# Patient Record
Sex: Female | Born: 1940
Health system: Southern US, Community
[De-identification: ages and names within clinical notes are randomized; demographics above are authoritative.]

## PROBLEM LIST (undated history)

## (undated) DIAGNOSIS — K219 Gastro-esophageal reflux disease without esophagitis: Secondary | ICD-10-CM

## (undated) DIAGNOSIS — E2839 Other primary ovarian failure: Secondary | ICD-10-CM

## (undated) DIAGNOSIS — K2950 Unspecified chronic gastritis without bleeding: Secondary | ICD-10-CM

## (undated) DIAGNOSIS — J309 Allergic rhinitis, unspecified: Secondary | ICD-10-CM

## (undated) DIAGNOSIS — M549 Dorsalgia, unspecified: Secondary | ICD-10-CM

## (undated) DIAGNOSIS — E7800 Pure hypercholesterolemia, unspecified: Secondary | ICD-10-CM

## (undated) DIAGNOSIS — B029 Zoster without complications: Secondary | ICD-10-CM

## (undated) DIAGNOSIS — K449 Diaphragmatic hernia without obstruction or gangrene: Secondary | ICD-10-CM

## (undated) DIAGNOSIS — F32A Depression, unspecified: Secondary | ICD-10-CM

## (undated) DIAGNOSIS — F329 Major depressive disorder, single episode, unspecified: Secondary | ICD-10-CM

## (undated) DIAGNOSIS — M8580 Other specified disorders of bone density and structure, unspecified site: Secondary | ICD-10-CM

## (undated) DIAGNOSIS — I10 Essential (primary) hypertension: Secondary | ICD-10-CM

## (undated) DIAGNOSIS — H269 Unspecified cataract: Secondary | ICD-10-CM

## (undated) DIAGNOSIS — F410 Panic disorder [episodic paroxysmal anxiety] without agoraphobia: Secondary | ICD-10-CM

## (undated) DIAGNOSIS — M1990 Unspecified osteoarthritis, unspecified site: Secondary | ICD-10-CM

## (undated) DIAGNOSIS — D689 Coagulation defect, unspecified: Secondary | ICD-10-CM

## (undated) DIAGNOSIS — F419 Anxiety disorder, unspecified: Secondary | ICD-10-CM

## (undated) DIAGNOSIS — Z8669 Personal history of other diseases of the nervous system and sense organs: Secondary | ICD-10-CM

## (undated) DIAGNOSIS — K760 Fatty (change of) liver, not elsewhere classified: Secondary | ICD-10-CM

## (undated) DIAGNOSIS — M542 Cervicalgia: Secondary | ICD-10-CM

## (undated) DIAGNOSIS — K589 Irritable bowel syndrome without diarrhea: Secondary | ICD-10-CM

## (undated) DIAGNOSIS — Z7901 Long term (current) use of anticoagulants: Secondary | ICD-10-CM

## (undated) DIAGNOSIS — F439 Reaction to severe stress, unspecified: Secondary | ICD-10-CM

## (undated) DIAGNOSIS — I82409 Acute embolism and thrombosis of unspecified deep veins of unspecified lower extremity: Secondary | ICD-10-CM

## (undated) DIAGNOSIS — E559 Vitamin D deficiency, unspecified: Secondary | ICD-10-CM

## (undated) DIAGNOSIS — D069 Carcinoma in situ of cervix, unspecified: Secondary | ICD-10-CM

## (undated) DIAGNOSIS — K644 Residual hemorrhoidal skin tags: Secondary | ICD-10-CM

## (undated) HISTORY — PX: TUBAL LIGATION: SHX77

## (undated) HISTORY — DX: Dorsalgia, unspecified: M54.9

## (undated) HISTORY — PX: TMJ ARTHROPLASTY: SHX1066

## (undated) HISTORY — DX: Unspecified cataract: H26.9

## (undated) HISTORY — DX: Other specified disorders of bone density and structure, unspecified site: M85.80

## (undated) HISTORY — DX: Other primary ovarian failure: E28.39

## (undated) HISTORY — DX: Essential (primary) hypertension: I10

## (undated) HISTORY — DX: Cervicalgia: M54.2

## (undated) HISTORY — PX: CATARACT EXTRACTION, BILATERAL: SHX1313

## (undated) HISTORY — DX: Panic disorder (episodic paroxysmal anxiety): F41.0

## (undated) HISTORY — PX: KNEE ARTHROSCOPY: SUR90

## (undated) HISTORY — DX: Depression, unspecified: F32.A

## (undated) HISTORY — DX: Anxiety disorder, unspecified: F41.9

## (undated) HISTORY — DX: Long term (current) use of anticoagulants: Z79.01

## (undated) HISTORY — DX: Coagulation defect, unspecified: D68.9

## (undated) HISTORY — DX: Diaphragmatic hernia without obstruction or gangrene: K44.9

## (undated) HISTORY — DX: Fatty (change of) liver, not elsewhere classified: K76.0

## (undated) HISTORY — DX: Unspecified osteoarthritis, unspecified site: M19.90

## (undated) HISTORY — DX: Vitamin D deficiency, unspecified: E55.9

## (undated) HISTORY — DX: Carcinoma in situ of cervix, unspecified: D06.9

## (undated) HISTORY — DX: Residual hemorrhoidal skin tags: K64.4

## (undated) HISTORY — DX: Pure hypercholesterolemia, unspecified: E78.00

## (undated) HISTORY — DX: Reaction to severe stress, unspecified: F43.9

## (undated) HISTORY — DX: Allergic rhinitis, unspecified: J30.9

## (undated) HISTORY — DX: Acute embolism and thrombosis of unspecified deep veins of unspecified lower extremity: I82.409

## (undated) HISTORY — DX: Irritable bowel syndrome, unspecified: K58.9

## (undated) HISTORY — DX: Unspecified chronic gastritis without bleeding: K29.50

## (undated) HISTORY — DX: Gastro-esophageal reflux disease without esophagitis: K21.9

## (undated) HISTORY — DX: Major depressive disorder, single episode, unspecified: F32.9

## (undated) HISTORY — DX: Personal history of other diseases of the nervous system and sense organs: Z86.69

---

## 1977-06-14 HISTORY — PX: VAGINAL HYSTERECTOMY: SUR661

## 1999-08-27 ENCOUNTER — Other Ambulatory Visit: Admission: RE | Admit: 1999-08-27 | Discharge: 1999-08-27 | Payer: Self-pay | Admitting: Obstetrics and Gynecology

## 2000-04-06 ENCOUNTER — Ambulatory Visit (HOSPITAL_BASED_OUTPATIENT_CLINIC_OR_DEPARTMENT_OTHER): Admission: RE | Admit: 2000-04-06 | Discharge: 2000-04-06 | Payer: Self-pay | Admitting: *Deleted

## 2000-04-06 ENCOUNTER — Encounter (INDEPENDENT_AMBULATORY_CARE_PROVIDER_SITE_OTHER): Payer: Self-pay | Admitting: *Deleted

## 2000-10-06 ENCOUNTER — Other Ambulatory Visit: Admission: RE | Admit: 2000-10-06 | Discharge: 2000-10-06 | Payer: Self-pay | Admitting: Obstetrics and Gynecology

## 2003-05-08 ENCOUNTER — Other Ambulatory Visit: Admission: RE | Admit: 2003-05-08 | Discharge: 2003-05-08 | Payer: Self-pay | Admitting: Obstetrics and Gynecology

## 2003-05-21 ENCOUNTER — Ambulatory Visit (HOSPITAL_COMMUNITY): Admission: RE | Admit: 2003-05-21 | Discharge: 2003-05-21 | Payer: Self-pay | Admitting: Family Medicine

## 2003-10-29 ENCOUNTER — Encounter (INDEPENDENT_AMBULATORY_CARE_PROVIDER_SITE_OTHER): Payer: Self-pay | Admitting: Gastroenterology

## 2003-10-29 ENCOUNTER — Ambulatory Visit (HOSPITAL_COMMUNITY): Admission: RE | Admit: 2003-10-29 | Discharge: 2003-10-29 | Payer: Self-pay | Admitting: Gastroenterology

## 2004-03-23 ENCOUNTER — Ambulatory Visit (HOSPITAL_COMMUNITY): Admission: RE | Admit: 2004-03-23 | Discharge: 2004-03-23 | Payer: Self-pay | Admitting: Family Medicine

## 2004-05-21 ENCOUNTER — Other Ambulatory Visit: Admission: RE | Admit: 2004-05-21 | Discharge: 2004-05-21 | Payer: Self-pay | Admitting: Obstetrics and Gynecology

## 2004-06-24 ENCOUNTER — Ambulatory Visit: Payer: Self-pay | Admitting: Gastroenterology

## 2004-07-03 ENCOUNTER — Encounter (INDEPENDENT_AMBULATORY_CARE_PROVIDER_SITE_OTHER): Payer: Self-pay | Admitting: Gastroenterology

## 2004-07-03 ENCOUNTER — Ambulatory Visit (HOSPITAL_COMMUNITY): Admission: RE | Admit: 2004-07-03 | Discharge: 2004-07-03 | Payer: Self-pay | Admitting: Gastroenterology

## 2004-07-13 ENCOUNTER — Ambulatory Visit: Payer: Self-pay | Admitting: Gastroenterology

## 2004-07-21 ENCOUNTER — Ambulatory Visit: Payer: Self-pay | Admitting: Gastroenterology

## 2004-09-17 ENCOUNTER — Ambulatory Visit: Payer: Self-pay | Admitting: Cardiology

## 2004-09-21 ENCOUNTER — Ambulatory Visit: Payer: Self-pay

## 2005-06-09 ENCOUNTER — Other Ambulatory Visit: Admission: RE | Admit: 2005-06-09 | Discharge: 2005-06-09 | Payer: Self-pay | Admitting: Obstetrics and Gynecology

## 2005-06-09 ENCOUNTER — Ambulatory Visit: Admission: RE | Admit: 2005-06-09 | Discharge: 2005-06-09 | Payer: Self-pay | Admitting: Obstetrics and Gynecology

## 2006-06-10 ENCOUNTER — Other Ambulatory Visit: Admission: RE | Admit: 2006-06-10 | Discharge: 2006-06-10 | Payer: Self-pay | Admitting: Obstetrics and Gynecology

## 2006-12-08 ENCOUNTER — Ambulatory Visit: Payer: Self-pay | Admitting: Vascular Surgery

## 2006-12-08 ENCOUNTER — Ambulatory Visit (HOSPITAL_COMMUNITY): Admission: RE | Admit: 2006-12-08 | Discharge: 2006-12-08 | Payer: Self-pay | Admitting: Obstetrics and Gynecology

## 2006-12-08 ENCOUNTER — Encounter: Payer: Self-pay | Admitting: Obstetrics and Gynecology

## 2007-06-12 ENCOUNTER — Other Ambulatory Visit: Admission: RE | Admit: 2007-06-12 | Discharge: 2007-06-12 | Payer: Self-pay | Admitting: Obstetrics and Gynecology

## 2008-06-13 ENCOUNTER — Other Ambulatory Visit: Admission: RE | Admit: 2008-06-13 | Discharge: 2008-06-13 | Payer: Self-pay | Admitting: Obstetrics and Gynecology

## 2008-06-13 ENCOUNTER — Encounter: Payer: Self-pay | Admitting: Obstetrics and Gynecology

## 2008-06-13 ENCOUNTER — Ambulatory Visit: Payer: Self-pay | Admitting: Obstetrics and Gynecology

## 2008-08-12 ENCOUNTER — Ambulatory Visit: Payer: Self-pay | Admitting: Obstetrics and Gynecology

## 2008-11-19 ENCOUNTER — Ambulatory Visit: Payer: Self-pay | Admitting: Obstetrics and Gynecology

## 2008-12-02 ENCOUNTER — Ambulatory Visit: Payer: Self-pay | Admitting: Obstetrics and Gynecology

## 2009-01-28 DIAGNOSIS — F329 Major depressive disorder, single episode, unspecified: Secondary | ICD-10-CM

## 2009-01-28 DIAGNOSIS — K644 Residual hemorrhoidal skin tags: Secondary | ICD-10-CM | POA: Insufficient documentation

## 2009-01-28 DIAGNOSIS — K2940 Chronic atrophic gastritis without bleeding: Secondary | ICD-10-CM | POA: Insufficient documentation

## 2009-01-28 DIAGNOSIS — K449 Diaphragmatic hernia without obstruction or gangrene: Secondary | ICD-10-CM | POA: Insufficient documentation

## 2009-01-28 DIAGNOSIS — R519 Headache, unspecified: Secondary | ICD-10-CM | POA: Insufficient documentation

## 2009-01-28 DIAGNOSIS — K219 Gastro-esophageal reflux disease without esophagitis: Secondary | ICD-10-CM | POA: Insufficient documentation

## 2009-01-28 DIAGNOSIS — I82409 Acute embolism and thrombosis of unspecified deep veins of unspecified lower extremity: Secondary | ICD-10-CM | POA: Insufficient documentation

## 2009-01-28 DIAGNOSIS — R51 Headache: Secondary | ICD-10-CM | POA: Insufficient documentation

## 2009-02-03 ENCOUNTER — Ambulatory Visit: Payer: Self-pay | Admitting: Internal Medicine

## 2009-02-03 DIAGNOSIS — F411 Generalized anxiety disorder: Secondary | ICD-10-CM | POA: Insufficient documentation

## 2009-03-18 ENCOUNTER — Ambulatory Visit: Payer: Self-pay | Admitting: Internal Medicine

## 2009-03-18 ENCOUNTER — Encounter: Payer: Self-pay | Admitting: Internal Medicine

## 2009-03-19 ENCOUNTER — Encounter: Payer: Self-pay | Admitting: Internal Medicine

## 2009-07-23 ENCOUNTER — Ambulatory Visit: Payer: Self-pay | Admitting: Obstetrics and Gynecology

## 2009-07-23 ENCOUNTER — Other Ambulatory Visit: Admission: RE | Admit: 2009-07-23 | Discharge: 2009-07-23 | Payer: Self-pay | Admitting: Obstetrics and Gynecology

## 2009-07-23 ENCOUNTER — Encounter: Payer: Self-pay | Admitting: Internal Medicine

## 2009-08-04 ENCOUNTER — Telehealth: Payer: Self-pay | Admitting: Internal Medicine

## 2009-08-05 ENCOUNTER — Telehealth: Payer: Self-pay | Admitting: Internal Medicine

## 2009-08-12 ENCOUNTER — Ambulatory Visit (HOSPITAL_COMMUNITY): Admission: RE | Admit: 2009-08-12 | Discharge: 2009-08-12 | Payer: Self-pay | Admitting: Internal Medicine

## 2009-08-20 ENCOUNTER — Ambulatory Visit: Payer: Self-pay | Admitting: Internal Medicine

## 2009-08-20 LAB — CONVERTED CEMR LAB
Anti Nuclear Antibody(ANA): NEGATIVE
Ferritin: 123.4 ng/mL (ref 10.0–291.0)
HCV Ab: REACTIVE — AB
INR: 1 (ref 0.8–1.0)
Prothrombin Time: 10.6 s (ref 9.1–11.7)

## 2010-02-13 ENCOUNTER — Ambulatory Visit: Payer: Self-pay | Admitting: Internal Medicine

## 2010-05-19 ENCOUNTER — Ambulatory Visit (HOSPITAL_COMMUNITY): Admission: RE | Admit: 2010-05-19 | Payer: Self-pay | Admitting: Family Medicine

## 2010-05-19 LAB — CONVERTED CEMR LAB
ALT: 48 units/L — ABNORMAL HIGH (ref 0–35)
AST: 36 units/L (ref 0–37)
Albumin: 4.3 g/dL (ref 3.5–5.2)
Alkaline Phosphatase: 38 units/L — ABNORMAL LOW (ref 39–117)
Bilirubin, Direct: 0.1 mg/dL (ref 0.0–0.3)
Total Bilirubin: 0.6 mg/dL (ref 0.3–1.2)
Total Protein: 7.1 g/dL (ref 6.0–8.3)

## 2010-05-21 ENCOUNTER — Ambulatory Visit: Payer: Self-pay | Admitting: Internal Medicine

## 2010-05-22 LAB — CONVERTED CEMR LAB
ALT: 36 units/L — ABNORMAL HIGH (ref 0–35)
AST: 23 units/L (ref 0–37)
Albumin: 4.4 g/dL (ref 3.5–5.2)
Alkaline Phosphatase: 46 units/L (ref 39–117)
Bilirubin, Direct: 0.1 mg/dL (ref 0.0–0.3)
Total Bilirubin: 0.5 mg/dL (ref 0.3–1.2)
Total Protein: 7.3 g/dL (ref 6.0–8.3)

## 2010-07-04 ENCOUNTER — Encounter: Payer: Self-pay | Admitting: Family Medicine

## 2010-07-05 ENCOUNTER — Encounter: Payer: Self-pay | Admitting: Family Medicine

## 2010-07-16 NOTE — Progress Notes (Signed)
Summary: Increased LFT's  Phone Note Outgoing Call   Call placed by: Hortense Ramal CMA Duncan Dull),  August 05, 2009 8:43 AM Call placed to: Patient Summary of Call: Dr Juanda Chance has looked over the [facility] panel sent to [place] by Dr Verl Dicker office for review. Dr Juanda Chance suggests the patient have an upper abdominal ultrasound with attention to the liver and gallbladder. After that has been completed, she would like to see the patient in the office for examination and discussion of the increased liver tests. I have scheduled the ultrasound and office visit and I have left a message on the patient's voicemail to call back. Initial call taken by: Hortense Ramal CMA Duncan Dull),  August 05, 2009 8:46 AM  Follow-up for Phone Call        I have spoken to the patient and have advised her that we have scheduled her for u/s and office visit. Patient was given times and dates and agrees to come for scheduled appointments. Follow-up by: Hortense Ramal CMA Duncan Dull),  August 05, 2009 9:31 AM  New Problems: LIVER FUNCTION TESTS, ABNORMAL, HX OF (ICD-V12.2)   New Problems: LIVER FUNCTION TESTS, ABNORMAL, HX OF (ICD-V12.2)

## 2010-07-16 NOTE — Progress Notes (Signed)
Summary: Triage  Phone Note Call from Patient Call back at Celll# [phone]   Caller: Patient Call For: Dr. Juanda Chance Reason for Call: Talk to Nurse Summary of Call: Pt. wants to talk with you about an appt. that was suppose to be scheduled. Did not want to give any details. Initial call taken by: Karna Christmas,  August 04, 2009 11:56 AM  Follow-up for Phone Call        Pt. states her OBGYN. told her to call Dottie, pt. requests to speak with her. I will forward. Follow-up by: Laureen Ochs LPN,  August 04, 2009 12:29 PM  Additional Follow-up for Phone Call Additional follow up Details #1::        I have left a message for the patient to call back. Hortense Ramal CMA Duncan Dull)  August 04, 2009 1:23 PM     Additional Follow-up for Phone Call Additional follow up Details #2::    I have spoken to patient and have advised her that I have given her labwork to Dr Juanda Chance to look at and advise on. I have told that patient that I will give her a call back with those recommendations once I have gotten a response from Dr Juanda Chance. Follow-up by: Hortense Ramal CMA Duncan Dull),  August 04, 2009 3:08 PM

## 2010-07-16 NOTE — Assessment & Plan Note (Signed)
Summary: discuss lft's...ultrasound results//dn   History of Present Illness Visit Type: Follow-up Visit Primary GI MD: [name] MD Primary Provider: Trellis Paganini, MD  Requesting Provider: n/a Chief Complaint: Discuss labs and ultrasound History of Present Illness:   This is a 70 year old white female with fatty liver disease demonstrated initially on an ultrasound in May 2005 and again on a CT scan of the abdomen and pelvis in January 2006. Most recently, she was found to have abnormal transaminases including an ALT of 47 with a normal AST of 35. An upper abdominal ultrasound done last week again showed a normal-appearing spleen and fatty liver without evidence of cirrhosis of focal lesions. Her gallbladder appears normal with a common bile duct of 3 mm. Patient is asymptomatic. She has gained about 20 pounds. She does not use alcohol. Patient has completed her hepatitis B vaccinatiosn. Other medical problems include irritable bowel syndrome for which she takes Levsin and gastroesophageal reflux controlled on Prevacid. Her last colonoscopy in October 2010 was essentially normal with random biopsies showing normal mucosa. Upper endoscopies in 2006 and again in August 2010 showed chronic active gastritis which was H. pylori negative.   GI Review of Systems      Denies abdominal pain, acid reflux, belching, bloating, chest pain, dysphagia with liquids, dysphagia with solids, heartburn, loss of appetite, nausea, vomiting, vomiting blood, weight loss, and  weight gain.        Denies anal fissure, black tarry stools, change in bowel habit, constipation, diarrhea, diverticulosis, fecal incontinence, heme positive stool, hemorrhoids, irritable bowel syndrome, jaundice, light color stool, liver problems, rectal bleeding, and  rectal pain.    Current Medications (verified): 1)  Omega-3 350 Mg Caps (Omega-3 Fatty Acids) .Marland Kitchen.. 1 By Mouth Once Daily 2)  Aspirin 81 Mg Tbec (Aspirin) .Marland Kitchen.. 1 By Mouth  Once Daily 3)  Vitamin E 400 Unit Caps (Vitamin E) .Marland Kitchen.. 1 By Mouth Once Daily 4)  Valium 2 Mg Tabs (Diazepam) .Marland Kitchen.. 1 By Mouth As Needed For Anxiety 5)  Prevacid 30 Mg  Cpdr (Lansoprazole) .Marland Kitchen.. 1 Capsule Twice A Day 30 Minutes Before Meals 6)  Levsin/sl 0.125 Mg  Subl (Hyoscyamine Sulfate) .Marland Kitchen.. 1 S.l. Every 4-6 Hours As Needed Abdominal Pain. 7)  Zyrtec Allergy 10 Mg Caps (Cetirizine Hcl) .... One Tablet By Mouth As Needed For Allergy  Allergies (verified): 1)  ! Keflex 2)  ! Sulfa 3)  ! Amoxicillin  Past History:  Past Medical History: Reviewed history from 01/28/2009 and no changes required. Current Problems:  EXTERNAL HEMORRHOIDS (ICD-455.3) GASTRITIS, CHRONIC (ICD-535.10) HIATAL HERNIA (ICD-553.3) DEPRESSION (ICD-311) HEADACHE, CHRONIC (ICD-784.0) GERD (ICD-530.81) FATTY LIVER DISEASE (ICD-571.8) Hx of DVT (ICD-453.40)    Past Surgical History: Reviewed history from 01/28/2009 and no changes required. Hysterectomy Right Knee Arthroscopy Tubal Ligation  Family History: Reviewed history from 02/03/2009 and no changes required. Family History of Ovarian Cancer: Aunt Family History of Breast Cancer: Sister Family History of Heart Disease: Mother, Father Family History of Esophageal Cancer: Brother (deceased in his 44's) No FH of Colon Cancer:  Social History: Reviewed history from 02/03/2009 and no changes required. Patient is a former smoker.  Alcohol Use - no Illicit Drug Use - no Occupation: housewife Daily Caffeine Use  Review of Systems       The patient complains of allergy/sinus, anxiety-new, fatigue, and headaches-new.  The patient denies anemia, arthritis/joint pain, back pain, blood in urine, breast changes/lumps, change in vision, confusion, cough, coughing up blood, depression-new, fainting, fever, hearing problems,  heart murmur, heart rhythm changes, itching, menstrual pain, muscle pains/cramps, night sweats, nosebleeds, pregnancy symptoms, shortness of  breath, skin rash, sleeping problems, sore throat, swelling of feet/legs, swollen lymph glands, thirst - excessive , urination - excessive , urination changes/pain, urine leakage, vision changes, and voice change.         Pertinent positive and negative review of systems were noted in the above HPI. All other ROS was otherwise negative.   Vital Signs:  Patient profile:   70 year old female Height:      62 inches Weight:      163 pounds BMI:     29.92 BSA:     1.75 Pulse rate:   88 / minute Pulse rhythm:   regular BP sitting:   120 / 74  (left arm) Cuff size:   regular  Vitals Entered By: Ok Anis CMA (August 20, 2009 9:03 AM)  Physical Exam  General:  Well developed, well nourished, no acute distress. Eyes:  PERRLA, no icterus. Mouth:  No deformity or lesions, dentition normal. Neck:  Supple; no masses or thyromegaly. Lungs:  Clear throughout to auscultation. Heart:  Regular rate and rhythm; no murmurs, rubs,  or bruits. Abdomen:  mildly protuberant abdomen which is soft and nontender with normoactive bowel sounds. Liver edge is at costal margin and it does not appear to be tender. Lower abdomen is unremarkable. Extremities:  No clubbing, cyanosis, edema or deformities noted. Neurologic:  no asterixis. Skin:  no palmar erythema or spider nevi Psych:  Alert and cooperative. Normal mood and affect.   Impression & Recommendations:  Problem # 1:  LIVER FUNCTION TESTS, ABNORMAL, HX OF (ICD-V12.2) Patient has mild elevation of transaminases consistent with fatty liver. This has been documented on prior radiographic studies in 2005 and  2006 and again recently. There is no evidence of significant hepatocellular dysfunction or portal hypertension. We need to rule out autoimmune liver disease, hemochromatosis as well as hepatitis C. She has received her hepatitis B vaccine. Patient needs to lose weight  with exercise and dietary modifications. She needs to lose 20-30 lbs over the next  year. I would not recommend  liver biopsy at this point since there is no evidence of significant hepatocellular dysfunction. Orders: T-ANA ([id number]) T-Hepatitis C Anti HCV (14782) TLB-Ferritin ([id number]-FER) TLB-PT (Protime) (85610-PTP)  Problem # 2:  GASTRITIS, CHRONIC (ICD-535.10) Patient is currently on Prevacid but it is too expensive. We will start her on Prilosec 20 mg daily instead.  Problem # 3:  SPECIAL SCREENING FOR MALIGNANT NEOPLASMS COLON (ICD-V76.51) status post colonoscopy in October 2010. A recall colonoscopy will be due in October 2020.  Patient Instructions: 1)  gradual weight loss with exercise and dietary modifications. 2)  Lab work today including ANA titer, ferritin and hepatitis C antibody as well as a prothrombin time. 3)  Prilosec 20 mg daily. 4)  Bentyl 10 mg p.o. b.i.d. p.r.n. in place of Levsin which is too expensive. 5)  I suggest we recheck her liver function tests in 6 months. 6)  Copy sent to : Dr Oletha Blend 7)  The medication list was reviewed and reconciled.  All changed / newly prescribed medications were explained.  A complete medication list was provided to the patient / caregiver. Prescriptions: PRILOSEC 20 MG CPDR (OMEPRAZOLE) TAke 1 tablet by mouth once daily  #30 x 6   Entered by:   Hortense Ramal CMA (AAMA)   Authorized by:   [name] MD   Signed by:  Hortense Ramal CMA Duncan Dull) on 08/20/2009   Method used:   Electronically to        [facility] (retail)       [address]       [place], [place]  [address]       Ph: [phone]       Fax: [phone]   RxID:   [id number] BENTYL 10 MG CAPS (DICYCLOMINE HCL) Take 1 tablet by mouth two times a day (please d/c prescription for levsin sl)  #60 x 6   Entered by:   Hortense Ramal CMA (AAMA)   Authorized by:   [name] MD   Signed by:   Hortense Ramal CMA (AAMA) on 08/20/2009   Method used:   Electronically to        [facility] (retail)       [address]       [place], [place]  [address]       Ph: [phone]       Fax: [phone]   RxID:   [id number]

## 2010-11-10 ENCOUNTER — Telehealth: Payer: Self-pay | Admitting: *Deleted

## 2010-11-10 DIAGNOSIS — K760 Fatty (change of) liver, not elsewhere classified: Secondary | ICD-10-CM

## 2010-11-10 NOTE — Telephone Encounter (Signed)
Message copied by Katelyn Taylor on Tue Nov 10, 2010  9:53 AM ------      Message from: Katelyn Taylor      Created: Tue Oct 13, 2010  8:36 AM       Call and remind patient LFT's are due on 11/2010

## 2010-11-10 NOTE — Telephone Encounter (Signed)
Message copied by Daphine Deutscher on Tue Nov 10, 2010  9:18 AM ------      Message from: Daphine Deutscher      Created: Tue Oct 13, 2010  8:36 AM       Call and remind patient LFT's are due on 11/2010

## 2010-11-10 NOTE — Telephone Encounter (Signed)
Patient wants to have labs done on 12/09/10. Labs in [facility] for patient.

## 2010-11-13 ENCOUNTER — Other Ambulatory Visit (INDEPENDENT_AMBULATORY_CARE_PROVIDER_SITE_OTHER): Payer: Self-pay

## 2010-11-13 DIAGNOSIS — K760 Fatty (change of) liver, not elsewhere classified: Secondary | ICD-10-CM

## 2010-11-13 DIAGNOSIS — K7689 Other specified diseases of liver: Secondary | ICD-10-CM

## 2010-11-13 LAB — HEPATIC FUNCTION PANEL
ALT: 30 U/L (ref 0–35)
AST: 24 U/L (ref 0–37)
Albumin: 4.1 g/dL (ref 3.5–5.2)
Alkaline Phosphatase: 41 U/L (ref 39–117)
Bilirubin, Direct: 0.1 mg/dL (ref 0.0–0.3)
Total Bilirubin: 0.6 mg/dL (ref 0.3–1.2)
Total Protein: 6.9 g/dL (ref 6.0–8.3)

## 2010-11-16 ENCOUNTER — Telehealth: Payer: Self-pay | Admitting: *Deleted

## 2010-11-16 DIAGNOSIS — R945 Abnormal results of liver function studies: Secondary | ICD-10-CM

## 2010-11-16 NOTE — Telephone Encounter (Signed)
Patient notified of results. Labs in [facility] for 05/18/11 to repeat LFT. Note to remind patient

## 2010-11-16 NOTE — Telephone Encounter (Signed)
Message copied by Daphine Deutscher on Mon Nov 16, 2010 10:38 AM ------      Message from: [name]      Created: Sat Nov 14, 2010 11:53 PM       Please call pt with normal LFT's repeat in 6 months

## 2010-12-09 ENCOUNTER — Encounter (INDEPENDENT_AMBULATORY_CARE_PROVIDER_SITE_OTHER): Payer: Medicare Other | Admitting: Obstetrics and Gynecology

## 2010-12-09 ENCOUNTER — Other Ambulatory Visit (HOSPITAL_COMMUNITY)
Admission: RE | Admit: 2010-12-09 | Discharge: 2010-12-09 | Disposition: A | Discharge status: Home / Self Care | Payer: Medicare Other | Source: Ambulatory Visit | Attending: Obstetrics and Gynecology | Admitting: Obstetrics and Gynecology

## 2010-12-09 ENCOUNTER — Other Ambulatory Visit: Payer: Self-pay | Admitting: Obstetrics and Gynecology

## 2010-12-09 DIAGNOSIS — N951 Menopausal and female climacteric states: Secondary | ICD-10-CM

## 2010-12-09 DIAGNOSIS — Z124 Encounter for screening for malignant neoplasm of cervix: Secondary | ICD-10-CM | POA: Insufficient documentation

## 2010-12-09 DIAGNOSIS — N63 Unspecified lump in unspecified breast: Secondary | ICD-10-CM

## 2010-12-09 DIAGNOSIS — D069 Carcinoma in situ of cervix, unspecified: Secondary | ICD-10-CM

## 2010-12-09 DIAGNOSIS — N644 Mastodynia: Secondary | ICD-10-CM

## 2010-12-10 ENCOUNTER — Other Ambulatory Visit: Payer: Self-pay | Admitting: *Deleted

## 2010-12-10 MED ORDER — DICYCLOMINE HCL 10 MG PO CAPS: 10.0000 mg | ORAL_CAPSULE | Freq: Two times a day (BID) | ORAL | Status: DC

## 2010-12-14 ENCOUNTER — Other Ambulatory Visit (INDEPENDENT_AMBULATORY_CARE_PROVIDER_SITE_OTHER): Payer: Medicare Other

## 2010-12-14 DIAGNOSIS — I82409 Acute embolism and thrombosis of unspecified deep veins of unspecified lower extremity: Secondary | ICD-10-CM

## 2010-12-14 DIAGNOSIS — K7689 Other specified diseases of liver: Secondary | ICD-10-CM

## 2011-01-26 ENCOUNTER — Ambulatory Visit (INDEPENDENT_AMBULATORY_CARE_PROVIDER_SITE_OTHER): Payer: Medicare Other | Admitting: *Deleted

## 2011-01-26 DIAGNOSIS — M899 Disorder of bone, unspecified: Secondary | ICD-10-CM

## 2011-01-26 DIAGNOSIS — M949 Disorder of cartilage, unspecified: Secondary | ICD-10-CM

## 2011-01-26 NOTE — Progress Notes (Signed)
BONE DENSITY PERFORMED 

## 2011-03-29 ENCOUNTER — Other Ambulatory Visit: Payer: Self-pay | Admitting: Dermatology

## 2011-06-09 ENCOUNTER — Other Ambulatory Visit (INDEPENDENT_AMBULATORY_CARE_PROVIDER_SITE_OTHER): Payer: Medicare Other

## 2011-06-09 DIAGNOSIS — R7989 Other specified abnormal findings of blood chemistry: Secondary | ICD-10-CM

## 2011-06-09 DIAGNOSIS — R945 Abnormal results of liver function studies: Secondary | ICD-10-CM

## 2011-06-09 LAB — HEPATIC FUNCTION PANEL
ALT: 27 U/L (ref 0–35)
AST: 23 U/L (ref 0–37)
Albumin: 4.1 g/dL (ref 3.5–5.2)
Alkaline Phosphatase: 39 U/L (ref 39–117)
Bilirubin, Direct: 0.1 mg/dL (ref 0.0–0.3)
Total Bilirubin: 0.4 mg/dL (ref 0.3–1.2)
Total Protein: 6.8 g/dL (ref 6.0–8.3)

## 2011-06-11 ENCOUNTER — Telehealth: Payer: Self-pay | Admitting: *Deleted

## 2011-06-11 NOTE — Telephone Encounter (Signed)
Patient given results as per Dr. Brodie 

## 2011-06-11 NOTE — Telephone Encounter (Signed)
Message copied by Daphine Deutscher on Fri Jun 11, 2011  8:52 AM ------      Message from: [name]      Created: Wed Jun 09, 2011  4:39 PM       Please call pt with normal LFT's.

## 2011-12-31 DIAGNOSIS — F411 Generalized anxiety disorder: Secondary | ICD-10-CM | POA: Diagnosis not present

## 2011-12-31 DIAGNOSIS — Z1331 Encounter for screening for depression: Secondary | ICD-10-CM | POA: Diagnosis not present

## 2011-12-31 DIAGNOSIS — J019 Acute sinusitis, unspecified: Secondary | ICD-10-CM | POA: Diagnosis not present

## 2012-01-27 ENCOUNTER — Encounter: Payer: Medicare Other | Admitting: Obstetrics and Gynecology

## 2012-02-22 ENCOUNTER — Encounter: Payer: Self-pay | Admitting: Obstetrics and Gynecology

## 2012-02-22 ENCOUNTER — Ambulatory Visit (INDEPENDENT_AMBULATORY_CARE_PROVIDER_SITE_OTHER): Payer: Medicare Other | Admitting: Obstetrics and Gynecology

## 2012-02-22 VITALS — BP 130/80 | Ht 60.0 in | Wt 168.0 lb

## 2012-02-22 DIAGNOSIS — Z833 Family history of diabetes mellitus: Secondary | ICD-10-CM

## 2012-02-22 DIAGNOSIS — K7689 Other specified diseases of liver: Secondary | ICD-10-CM

## 2012-02-22 DIAGNOSIS — M899 Disorder of bone, unspecified: Secondary | ICD-10-CM

## 2012-02-22 DIAGNOSIS — E7800 Pure hypercholesterolemia, unspecified: Secondary | ICD-10-CM

## 2012-02-22 DIAGNOSIS — R8290 Unspecified abnormal findings in urine: Secondary | ICD-10-CM

## 2012-02-22 DIAGNOSIS — Z780 Asymptomatic menopausal state: Secondary | ICD-10-CM

## 2012-02-22 DIAGNOSIS — N644 Mastodynia: Secondary | ICD-10-CM

## 2012-02-22 DIAGNOSIS — M8580 Other specified disorders of bone density and structure, unspecified site: Secondary | ICD-10-CM

## 2012-02-22 DIAGNOSIS — R82998 Other abnormal findings in urine: Secondary | ICD-10-CM | POA: Diagnosis not present

## 2012-02-22 DIAGNOSIS — K760 Fatty (change of) liver, not elsewhere classified: Secondary | ICD-10-CM

## 2012-02-22 DIAGNOSIS — D069 Carcinoma in situ of cervix, unspecified: Secondary | ICD-10-CM

## 2012-02-22 LAB — CBC WITH DIFFERENTIAL/PLATELET
Basophils Absolute: 0 10*3/uL (ref 0.0–0.1)
Basophils Relative: 1 % (ref 0–1)
Eosinophils Relative: 3 % (ref 0–5)
HCT: 41 % (ref 36.0–46.0)
Hemoglobin: 14.1 g/dL (ref 12.0–15.0)
MCHC: 34.4 g/dL (ref 30.0–36.0)
MCV: 88 fL (ref 78.0–100.0)
Monocytes Absolute: 0.4 10*3/uL (ref 0.1–1.0)
Monocytes Relative: 6 % (ref 3–12)
Neutro Abs: 3.2 10*3/uL (ref 1.7–7.7)
RDW: 13.7 % (ref 11.5–15.5)

## 2012-02-22 LAB — COMPREHENSIVE METABOLIC PANEL
Albumin: 4.3 g/dL (ref 3.5–5.2)
BUN: 13 mg/dL (ref 6–23)
Calcium: 9.6 mg/dL (ref 8.4–10.5)
Chloride: 103 mEq/L (ref 96–112)
Creat: 0.62 mg/dL (ref 0.50–1.10)
Glucose, Bld: 90 mg/dL (ref 70–99)
Potassium: 4.8 mEq/L (ref 3.5–5.3)

## 2012-02-22 LAB — LIPID PANEL
Cholesterol: 135 mg/dL (ref 0–200)
HDL: 57 mg/dL (ref >39)
LDL Cholesterol: 62 mg/dL (ref 0–99)
Triglycerides: 78 mg/dL (ref <150)

## 2012-02-22 MED ORDER — DIAZEPAM 10 MG PO TABS: 10.0000 mg | ORAL_TABLET | ORAL | Status: AC | PRN

## 2012-02-22 NOTE — Progress Notes (Signed)
Patient came to see me today for further followup. She continues to have mastodynia of her left breast. It is intermittent. It is diffuse. It has been previously evaluated with mammography. She is currently due for followup mammography having had her last mammogram in December, 2011. Her sister had breast cancer in her 66s and has not survived. Her maternal aunt had breast cancer and the patient does not know when. She is also died. There is no family history of ovarian cancer. The patient had a vaginal hysterectomy in the 1970s for carcinoma in situ of the cervix. She has had normal Pap smears since then. Her last Pap smear was 2012. She has a history of fatty liver that we diagnosed and referred her to Dr. Dickie La. She is previously had elevated liver function tests and requested that we recheck them today. She will then go back to see Dr. Dickie La. She has a previous history of elevated cholesterol. Her last lipid profile was normal. She requested lipid profile today. She has a history of osteopenia without an elevated fracture risk. She had her last study in August, 2012. She has had no fractures. She continues to have hot flashes but does not require treatment. She has vaginal dryness but declined treatment. She is having no vaginal bleeding. She is having no pelvic pain. She is under a lot of stress with a disabled daughter and grandchildren she is responsible for. She takes Valium on a when necessary basis.  ROS: 12 system review done. Pertinent positives above. Other positives include hiatal hernia, GERDand history of a DVT.  HEENT: Within normal limits.Kennon Portela present. Neck: No masses. Supraclavicular lymph nodes: Not enlarged. Breasts: Examined in both sitting and lying position. Symmetrical without skin changes or masses. Abdomen: Soft no masses guarding or rebound. No hernias. Pelvic: External within normal limits. BUS within normal limits. Vaginal examination shows poor estrogen effect, no  cystocele enterocele or rectocele. Cervix and uterus absent. Adnexa within normal limits. Rectovaginal confirmatory. Extremities within normal limits.  Assessment: #1. Menopausal symptoms #2. Atrophic vaginitis #3. Family history of early onset breast cancer #4. CIN-3 #5. Osteopenia #6. Mastodynia #7. The fatty liver #8. Anxiety  Plan: We gave her prescription for Valium with refills until she finds her new PCP. Bone density in 2014. Mammogram. Appropriate lab work done. Genetic counseling at the cancer center due to early onset breast cancer family history.The new Pap smear guidelines were discussed with the patient. No pap  done.

## 2012-02-22 NOTE — Patient Instructions (Signed)
Schedule mammogram. Schedule appointment at cancer center to consider BRCA1 and BRCA2 testing.

## 2012-02-23 LAB — URINALYSIS W MICROSCOPIC + REFLEX CULTURE
Bacteria, UA: NONE SEEN
Bilirubin Urine: NEGATIVE
Crystals: NONE SEEN
Glucose, UA: NEGATIVE mg/dL
Hgb urine dipstick: NEGATIVE
Ketones, ur: NEGATIVE mg/dL
Leukocytes, UA: NEGATIVE
Nitrite: NEGATIVE
Protein, ur: NEGATIVE mg/dL
Specific Gravity, Urine: 1.025 (ref 1.005–1.030)
Squamous Epithelial / HPF: NONE SEEN
Urobilinogen, UA: 0.2 mg/dL (ref 0.0–1.0)
pH: 6.5 (ref 5.0–8.0)

## 2012-02-24 DIAGNOSIS — Z1231 Encounter for screening mammogram for malignant neoplasm of breast: Secondary | ICD-10-CM | POA: Diagnosis not present

## 2012-02-25 ENCOUNTER — Encounter: Payer: Self-pay | Admitting: Obstetrics and Gynecology

## 2012-03-16 DIAGNOSIS — M19049 Primary osteoarthritis, unspecified hand: Secondary | ICD-10-CM | POA: Diagnosis not present

## 2012-03-16 DIAGNOSIS — G5600 Carpal tunnel syndrome, unspecified upper limb: Secondary | ICD-10-CM | POA: Diagnosis not present

## 2012-03-16 DIAGNOSIS — M47812 Spondylosis without myelopathy or radiculopathy, cervical region: Secondary | ICD-10-CM | POA: Diagnosis not present

## 2012-05-02 DIAGNOSIS — L723 Sebaceous cyst: Secondary | ICD-10-CM | POA: Diagnosis not present

## 2012-05-02 DIAGNOSIS — L570 Actinic keratosis: Secondary | ICD-10-CM | POA: Diagnosis not present

## 2012-05-02 DIAGNOSIS — L819 Disorder of pigmentation, unspecified: Secondary | ICD-10-CM | POA: Diagnosis not present

## 2012-05-02 DIAGNOSIS — D1801 Hemangioma of skin and subcutaneous tissue: Secondary | ICD-10-CM | POA: Diagnosis not present

## 2012-05-02 DIAGNOSIS — L821 Other seborrheic keratosis: Secondary | ICD-10-CM | POA: Diagnosis not present

## 2012-06-01 DIAGNOSIS — H6980 Other specified disorders of Eustachian tube, unspecified ear: Secondary | ICD-10-CM | POA: Diagnosis not present

## 2012-06-01 DIAGNOSIS — J3489 Other specified disorders of nose and nasal sinuses: Secondary | ICD-10-CM | POA: Diagnosis not present

## 2012-08-01 DIAGNOSIS — H6980 Other specified disorders of Eustachian tube, unspecified ear: Secondary | ICD-10-CM | POA: Diagnosis not present

## 2012-08-01 DIAGNOSIS — N390 Urinary tract infection, site not specified: Secondary | ICD-10-CM | POA: Diagnosis not present

## 2012-08-01 DIAGNOSIS — R609 Edema, unspecified: Secondary | ICD-10-CM | POA: Diagnosis not present

## 2012-08-03 ENCOUNTER — Telehealth: Payer: Self-pay | Admitting: Internal Medicine

## 2012-08-03 NOTE — Telephone Encounter (Signed)
Patient states her PCP wants her to see Dr. Juanda Chance for GERD. Offered OV with extender but she wants to see Dr. Juanda Chance. Scheduled patient on 08/15/12 at 10:00 AM.

## 2012-08-07 ENCOUNTER — Encounter: Payer: Self-pay | Admitting: *Deleted

## 2012-08-10 DIAGNOSIS — H04129 Dry eye syndrome of unspecified lacrimal gland: Secondary | ICD-10-CM | POA: Diagnosis not present

## 2012-08-10 DIAGNOSIS — H40019 Open angle with borderline findings, low risk, unspecified eye: Secondary | ICD-10-CM | POA: Diagnosis not present

## 2012-08-10 DIAGNOSIS — H2510 Age-related nuclear cataract, unspecified eye: Secondary | ICD-10-CM | POA: Diagnosis not present

## 2012-08-10 DIAGNOSIS — H01009 Unspecified blepharitis unspecified eye, unspecified eyelid: Secondary | ICD-10-CM | POA: Diagnosis not present

## 2012-08-15 ENCOUNTER — Ambulatory Visit: Payer: BLUE CROSS/BLUE SHIELD | Admitting: Internal Medicine

## 2012-08-30 DIAGNOSIS — I10 Essential (primary) hypertension: Secondary | ICD-10-CM | POA: Diagnosis not present

## 2012-08-30 DIAGNOSIS — R109 Unspecified abdominal pain: Secondary | ICD-10-CM | POA: Diagnosis not present

## 2012-08-30 DIAGNOSIS — N390 Urinary tract infection, site not specified: Secondary | ICD-10-CM | POA: Diagnosis not present

## 2012-08-31 ENCOUNTER — Other Ambulatory Visit (HOSPITAL_BASED_OUTPATIENT_CLINIC_OR_DEPARTMENT_OTHER): Payer: Self-pay | Admitting: Family Medicine

## 2012-08-31 DIAGNOSIS — R109 Unspecified abdominal pain: Secondary | ICD-10-CM

## 2012-09-04 ENCOUNTER — Ambulatory Visit (HOSPITAL_BASED_OUTPATIENT_CLINIC_OR_DEPARTMENT_OTHER): Admission: RE | Admit: 2012-09-04 | Payer: BLUE CROSS/BLUE SHIELD | Source: Ambulatory Visit

## 2012-09-08 ENCOUNTER — Ambulatory Visit (HOSPITAL_BASED_OUTPATIENT_CLINIC_OR_DEPARTMENT_OTHER)
Admission: RE | Admit: 2012-09-08 | Discharge: 2012-09-08 | Disposition: A | Discharge status: Home / Self Care | Payer: Medicare Other | Source: Ambulatory Visit | Attending: Family Medicine | Admitting: Family Medicine

## 2012-09-08 DIAGNOSIS — R1011 Right upper quadrant pain: Secondary | ICD-10-CM | POA: Insufficient documentation

## 2012-09-08 DIAGNOSIS — R198 Other specified symptoms and signs involving the digestive system and abdomen: Secondary | ICD-10-CM | POA: Diagnosis not present

## 2012-09-08 DIAGNOSIS — R1013 Epigastric pain: Secondary | ICD-10-CM | POA: Diagnosis not present

## 2012-10-04 DIAGNOSIS — I10 Essential (primary) hypertension: Secondary | ICD-10-CM | POA: Diagnosis not present

## 2012-10-10 ENCOUNTER — Ambulatory Visit: Payer: BLUE CROSS/BLUE SHIELD | Admitting: Internal Medicine

## 2012-10-12 ENCOUNTER — Encounter: Payer: Self-pay | Admitting: Internal Medicine

## 2012-10-20 DIAGNOSIS — L719 Rosacea, unspecified: Secondary | ICD-10-CM | POA: Diagnosis not present

## 2012-10-25 DIAGNOSIS — L719 Rosacea, unspecified: Secondary | ICD-10-CM | POA: Diagnosis not present

## 2012-10-25 DIAGNOSIS — I10 Essential (primary) hypertension: Secondary | ICD-10-CM | POA: Diagnosis not present

## 2012-12-05 ENCOUNTER — Encounter: Payer: Self-pay | Admitting: Internal Medicine

## 2012-12-05 ENCOUNTER — Other Ambulatory Visit (INDEPENDENT_AMBULATORY_CARE_PROVIDER_SITE_OTHER): Payer: Medicare Other

## 2012-12-05 ENCOUNTER — Ambulatory Visit (INDEPENDENT_AMBULATORY_CARE_PROVIDER_SITE_OTHER): Payer: Medicare Other | Admitting: Internal Medicine

## 2012-12-05 VITALS — BP 132/74 | HR 74 | Ht 60.0 in | Wt 171.0 lb

## 2012-12-05 DIAGNOSIS — R1013 Epigastric pain: Secondary | ICD-10-CM

## 2012-12-05 DIAGNOSIS — K3189 Other diseases of stomach and duodenum: Secondary | ICD-10-CM | POA: Diagnosis not present

## 2012-12-05 DIAGNOSIS — R1011 Right upper quadrant pain: Secondary | ICD-10-CM

## 2012-12-05 LAB — COMPREHENSIVE METABOLIC PANEL
ALT: 62 U/L — ABNORMAL HIGH (ref 0–35)
AST: 53 U/L — ABNORMAL HIGH (ref 0–37)
CO2: 29 mEq/L (ref 19–32)
Chloride: 103 mEq/L (ref 96–112)
Creatinine, Ser: 0.7 mg/dL (ref 0.4–1.2)
GFR: 95.64 mL/min (ref >60.00)
Sodium: 142 mEq/L (ref 135–145)
Total Bilirubin: 0.8 mg/dL (ref 0.3–1.2)
Total Protein: 7.7 g/dL (ref 6.0–8.3)

## 2012-12-05 MED ORDER — LANSOPRAZOLE 30 MG PO CPDR: 30.0000 mg | DELAYED_RELEASE_CAPSULE | Freq: Every day | ORAL | Status: DC

## 2012-12-05 NOTE — Patient Instructions (Addendum)
You have been scheduled for a HIDA scan at [facility] (1st floor) on 12/19/12. Please arrive 15 minutes prior to your scheduled appointment at 9:00 am. Make certain not to have anything to eat or drink at least 6 hours prior to your test. Should this appointment date or time not work well for you, please call radiology scheduling at [phone].  _____________________________________________________________________ hepatobiliary (HIDA) scan is an imaging procedure used to diagnose problems in the liver, gallbladder and bile ducts. In the HIDA scan, a radioactive chemical or tracer is injected into a vein in your arm. The tracer is handled by the liver like bile. Bile is a fluid produced and excreted by your liver that helps your digestive system break down fats in the foods you eat. Bile is stored in your gallbladder and the gallbladder releases the bile when you eat a meal. A special nuclear medicine scanner (gamma camera) tracks the flow of the tracer from your liver into your gallbladder and small intestine.  During your HIDA scan  You'll be asked to change into a hospital gown before your HIDA scan begins. Your health care team will position you on a table, usually on your back. The radioactive tracer is then injected into a vein in your arm.The tracer travels through your bloodstream to your liver, where it's taken up by the bile-producing cells. The radioactive tracer travels with the bile from your liver into your gallbladder and through your bile ducts to your small intestine.You may feel some pressure while the radioactive tracer is injected into your vein. As you lie on the table, a special gamma camera is positioned over your abdomen taking pictures of the tracer as it moves through your body. The gamma camera takes pictures continually for about an hour. You'll need to keep still during the HIDA scan. This can become uncomfortable, but you may find that you can lessen the discomfort by  taking deep breaths and thinking about other things. Tell your health care team if you're uncomfortable. The radiologist will watch on a computer the progress of the radioactive tracer through your body. The HIDA scan may be stopped when the radioactive tracer is seen in the gallbladder and enters your small intestine. This typically takes about an hour. In some cases extra imaging will be performed if original images aren't satisfactory, if morphine is given to help visualize the gallbladder or if the medication CCK is given to look at the contraction of the gallbladder. This test typically takes 2 hours to complete. ________________________________________________________________________  Your physician has requested that you go to the basement for the following lab work before leaving today: CMET  We have sent the following medications to your pharmacy for you to pick up at your convenience: Prevacid  CC: Dr Marinda Elk

## 2012-12-05 NOTE — Progress Notes (Signed)
Katelyn Taylor February 11, 1941 MRN [id number]   History of Present Illness:  This is a 72 year old white female with gastroesophageal reflux and irritable bowel syndrome. About a month ago, she experienced an attack of right upper quadrant abdominal pain which since then has subsided. An upper endoscopy in 2006 and again in 2010 showed erosive gastritis and esophagitis. She had a 2 cm hiatal hernia. A colonoscopy at that time showed hemorrhoids and prep related mucosal injury. A recent upper abdominal ultrasound to look for hernia was negative. An abdominal ultrasound in March 2011 showed fatty liver, 3 mm gallbladder wall and a 4 mm common bile duct.   Past Medical History  Diagnosis Date  . Severe dysplasia of cervix (CIN III)   . DVT (deep venous thrombosis)   . Anticoagulated   . Osteopenia   . Fatty liver   . GERD (gastroesophageal reflux disease)   . External hemorrhoid   . Hiatal hernia   . Chronic gastritis   . Depression    Past Surgical History  Procedure Laterality Date  . Knee arthroscopy Right   . Vaginal hysterectomy    . Tmj arthroplasty    . Tubal ligation      reports that she has quit smoking. She does not have any smokeless tobacco history on file. She reports that she does not drink alcohol or use illicit drugs. family history includes Breast cancer in her maternal aunt; Breast cancer (age of onset: 72) in her sister; Heart disease in her father, mother, and sister; Lung cancer in her brother; Melanoma in her mother; Ovarian cancer in an unspecified family member; Stroke in her mother; and Throat cancer in her brother.  There is no history of Colon cancer. Allergies  Allergen Reactions  . Amoxicillin   . Cephalexin     REACTION: Questionable  . Codeine   . Penicillins   . Sulfonamide Derivatives         Review of Systems: Occasional reflux. Weight gain  The remainder of the 10 point ROS is negative except as outlined in H&P   Physical Exam: General  appearance  Well developed, in no distress. Eyes- non icteric. HEENT nontraumatic, normocephalic. Mouth no lesions, tongue papillated, no cheilosis. Neck supple without adenopathy, thyroid not enlarged, no carotid bruits, no JVD. Lungs Clear to auscultation bilaterally. Cor normal S1, normal S2, regular rhythm, no murmur,  quiet precordium. Abdomen: Obese protuberant with normoactive bowel sounds. Tenderness in epigastrium and right upper quadrant, no rebound. No palpable mass. Rectal: Not done.  Extremities no pedal edema. Skin no lesions. Neurological alert and oriented x 3. Psychological normal mood and affect.  Assessment and Plan:  Problem #36 72 year old white female with dyspepsia, irritable bowel syndrome and gastroesophageal reflux. She has fatty liver on ultrasound. The gallbladder wall was 3 mm which is borderline abnormal. We will obtain a HIDA scan with CCK. She will increase her Prevacid to 30 mg daily.  Problem #2 Abnormal liver function tests. We will recheck today as she has fatty liver. We have discussed weight loss.being the most important factor in reducing the fatty infiltration.  Problem #3 Colorectal screening. Her last colonoscopy was in October 2010. She's up-to-date.   12/05/2012 Katelyn Taylor

## 2012-12-19 ENCOUNTER — Encounter (HOSPITAL_COMMUNITY)
Admission: RE | Admit: 2012-12-19 | Discharge: 2012-12-19 | Disposition: A | Discharge status: Home / Self Care | Payer: Medicare Other | Source: Ambulatory Visit | Attending: Internal Medicine | Admitting: Internal Medicine

## 2012-12-19 ENCOUNTER — Encounter (HOSPITAL_COMMUNITY): Payer: Self-pay

## 2012-12-19 DIAGNOSIS — R932 Abnormal findings on diagnostic imaging of liver and biliary tract: Secondary | ICD-10-CM | POA: Insufficient documentation

## 2012-12-19 DIAGNOSIS — R1011 Right upper quadrant pain: Secondary | ICD-10-CM | POA: Diagnosis not present

## 2012-12-19 DIAGNOSIS — K3189 Other diseases of stomach and duodenum: Secondary | ICD-10-CM | POA: Insufficient documentation

## 2012-12-19 DIAGNOSIS — R1013 Epigastric pain: Secondary | ICD-10-CM | POA: Diagnosis not present

## 2012-12-19 MED ORDER — TECHNETIUM TC 99M MEBROFENIN IV KIT
5.1000 | PACK | Freq: Once | INTRAVENOUS | Status: AC | PRN
  Administered 2012-12-19: 5.1 via INTRAVENOUS

## 2012-12-19 MED ORDER — SINCALIDE 5 MCG IJ SOLR: 0.0200 ug/kg | Freq: Once | INTRAMUSCULAR | Status: DC

## 2012-12-26 ENCOUNTER — Encounter: Payer: Self-pay | Admitting: Internal Medicine

## 2012-12-26 ENCOUNTER — Ambulatory Visit (INDEPENDENT_AMBULATORY_CARE_PROVIDER_SITE_OTHER): Payer: Medicare Other | Admitting: Internal Medicine

## 2012-12-26 VITALS — BP 120/70 | HR 66 | Ht 60.0 in | Wt 169.0 lb

## 2012-12-26 DIAGNOSIS — R932 Abnormal findings on diagnostic imaging of liver and biliary tract: Secondary | ICD-10-CM

## 2012-12-26 DIAGNOSIS — R1013 Epigastric pain: Secondary | ICD-10-CM

## 2012-12-26 NOTE — Patient Instructions (Addendum)
You have been scheduled for an appointment with Dr Karie Soda at [facility]. Your appointment is on Wednesday 01/10/13 at 9:30 am. Please arrive at 9:00 am for registration. Make certain to bring a list of current medications, including any over the counter medications or vitamins. Also bring your co-pay if you have one as well as your insurance cards. [facility] is located at [address]. Should you need to reschedule your appointment, please contact them at [phone].  CC: Dr Tommi Emery

## 2012-12-26 NOTE — Progress Notes (Signed)
Katelyn Taylor 1941/05/09 MRN [id number]  History of Present Illness:  This is a 72 year old white female with epigastric discomfort, dyspepsia and episodes of upper abdominal pain. I saw her about 1 month ago after she had an attack of right upper quadrant abdominal pain. An upper abdominal ultrasound showed a 3 mm gallbladder wall consistent with chronic cholecystitis. Her ultrasound in 2011 showed fatty liver and , a 3 mm gallbladder polyp. A HIDA scan on 12/05/2012 showed normal visualization of the gallbladder in 5 minutes but the small bowel was not visualized until CCK injection. The calculated ejection fraction was 7%, normal being greater than 30%. Her transaminases are slightly elevated with an AST of 53 and ALT of 62 with normal albumin and alkaline phosphatase. She has known fatty liver and has been overweight. She has a known history of gastroesophageal reflux for which she had an upper endoscopy in 2006 and again in 2010 with findings of erosive gastritis and esophagitis. She has been on Prevacid 30 mg daily. Patient has custody of her grandson whose father (patient's son) is an alcoholic.   Past Medical History  Diagnosis Date  . Severe dysplasia of cervix (CIN III)   . DVT (deep venous thrombosis)   . Anticoagulated   . Osteopenia   . Fatty liver   . GERD (gastroesophageal reflux disease)   . External hemorrhoid   . Hiatal hernia   . Chronic gastritis   . Depression    Past Surgical History  Procedure Laterality Date  . Knee arthroscopy Right   . Vaginal hysterectomy      Partial  . Tmj arthroplasty    . Tubal ligation      reports that she has quit smoking. Her smoking use included Cigarettes. She smoked 0.00 packs per day. She has never used smokeless tobacco. She reports that she does not drink alcohol or use illicit drugs. family history includes Breast cancer in her maternal aunt; Breast cancer (age of onset: 25) in her sister; Heart disease in her father, mother, and  sister; Lung cancer in her brother; Melanoma in her mother; Ovarian cancer in an unspecified family member; Stroke in her mother; and Throat cancer in her brother.  There is no history of Colon cancer. Allergies  Allergen Reactions  . Amoxicillin   . Cephalexin     REACTION: Questionable  . Codeine   . Penicillins   . Sulfonamide Derivatives         Review of Systems:  The remainder of the 10 point ROS is negative except as outlined in H&P   Physical Exam: General appearance  Well developed, in no distress. Overweight Eyes- non icteric. HEENT nontraumatic, normocephalic. Mouth no lesions, tongue papillated, no cheilosis. Neck supple without adenopathy, thyroid not enlarged, no carotid bruits, no JVD. Lungs Clear to auscultation bilaterally. Cor normal S1, normal S2, regular rhythm, no murmur,  quiet precordium. Abdomen: Protuberant abdomen very tender in epigastrium and along the right costal margin. No CVA tenderness. Liver edge at costal margin. No ascites. Normoactive bowel sounds. Rectal: Not repeated. Extremities no pedal edema. Skin no lesions. Neurological alert and oriented x 3. Psychological normal mood and affect.  Assessment and Plan:  Problem #53 72 year old white female with epigastric discomfort and dyspepsia which could be related to symptomatic gallbladder disease or sphincter of oddi dysfunction as well as to gastroesophageal reflux which she has had for many years and for which she takes Prevacid. She does not have typical gallbladder attacks. Her symptoms are  more vague. For that reason, I am not sure that she would be necessarily improved with a cholecystectomy.but she would like to see a Careers adviser. She does have a positive family history of gallbladder disease in her daughter. We will make a surgical referral. She may need a repeat upper endoscopy prior to surgery to make sure that there is no other etiology for epigastric discomfort.   12/26/2012 Katelyn Taylor

## 2013-01-05 DIAGNOSIS — M545 Low back pain, unspecified: Secondary | ICD-10-CM | POA: Diagnosis not present

## 2013-01-05 DIAGNOSIS — J019 Acute sinusitis, unspecified: Secondary | ICD-10-CM | POA: Diagnosis not present

## 2013-01-10 ENCOUNTER — Encounter (INDEPENDENT_AMBULATORY_CARE_PROVIDER_SITE_OTHER): Payer: Self-pay | Admitting: Surgery

## 2013-01-10 ENCOUNTER — Ambulatory Visit (INDEPENDENT_AMBULATORY_CARE_PROVIDER_SITE_OTHER): Payer: Medicare Other | Admitting: Surgery

## 2013-01-10 VITALS — BP 130/72 | HR 68 | Resp 16 | Ht 60.0 in | Wt 168.4 lb

## 2013-01-10 DIAGNOSIS — G8929 Other chronic pain: Secondary | ICD-10-CM

## 2013-01-10 DIAGNOSIS — K7581 Nonalcoholic steatohepatitis (NASH): Secondary | ICD-10-CM | POA: Insufficient documentation

## 2013-01-10 DIAGNOSIS — K829 Disease of gallbladder, unspecified: Secondary | ICD-10-CM

## 2013-01-10 DIAGNOSIS — R110 Nausea: Secondary | ICD-10-CM

## 2013-01-10 DIAGNOSIS — R109 Unspecified abdominal pain: Secondary | ICD-10-CM

## 2013-01-10 DIAGNOSIS — K449 Diaphragmatic hernia without obstruction or gangrene: Secondary | ICD-10-CM

## 2013-01-10 DIAGNOSIS — K7689 Other specified diseases of liver: Secondary | ICD-10-CM

## 2013-01-10 NOTE — Patient Instructions (Addendum)
Obtain a gastric emptying study to rule out poor stomach emptying/gastroparesis.  Call us after that test is done for further recommendations.  If you have gastroparesis, medicine is used to help that problem under the guidance of gastroenterology/medicine  Increase MiraLAX to have one soft bowel movement a day and correct constipation.  If gastric emptying study is normal and you are still having symptoms, you may benefit from laparoscopic cholecystectomy to remove your gallbladder  GETTING TO GOOD BOWEL HEALTH. Irregular bowel habits such as constipation and diarrhea can lead to many problems over time.  Having one soft bowel movement a day is the most important way to prevent further problems.  The anorectal canal is designed to handle stretching and feces to safely manage our ability to get rid of solid waste (feces, poop, stool) out of our body.  BUT, hard constipated stools can act like ripping concrete bricks and diarrhea can be a burning fire to this very sensitive area of our body, causing inflamed hemorrhoids, anal fissures, increasing risk is perirectal abscesses, abdominal pain/bloating, an making irritable bowel worse.     The goal: ONE SOFT BOWEL MOVEMENT A DAY!  To have soft, regular bowel movements:    Drink at least 8 tall glasses of water a day.     Take plenty of fiber.  Fiber is the undigested part of plant food that passes into the colon, acting s "natures broom" to encourage bowel motility and movement.  Fiber can absorb and hold large amounts of water. This results in a larger, bulkier stool, which is soft and easier to pass. Work gradually over several weeks up to 6 servings a day of fiber (25g a day even more if needed) in the form of: o Vegetables -- Root (potatoes, carrots, turnips), leafy green (lettuce, salad greens, celery, spinach), or cooked high residue (cabbage, broccoli, etc) o Fruit -- Fresh (unpeeled skin & pulp), Dried (prunes, apricots, cherries, etc ),  or stewed (  applesauce)  o Whole grain breads, pasta, etc (whole wheat)  o Bran cereals    Bulking Agents -- This type of water-retaining fiber generally is easily obtained each day by one of the following:  o Psyllium bran -- The psyllium plant is remarkable because its ground seeds can retain so much water. This product is available as Metamucil, Konsyl, Effersyllium, Per Diem Fiber, or the less expensive generic preparation in drug and health food stores. Although labeled a laxative, it really is not a laxative.  o Methylcellulose -- This is another fiber derived from wood which also retains water. It is available as Citrucel. o Polyethylene Glycol - and "artificial" fiber commonly called Miralax or Glycolax.  It is helpful for people with gassy or bloated feelings with regular fiber o Flax Seed - a less gassy fiber than psyllium   No reading or other relaxing activity while on the toilet. If bowel movements take longer than 5 minutes, you are too constipated   AVOID CONSTIPATION.  High fiber and water intake usually takes care of this.  Sometimes a laxative is needed to stimulate more frequent bowel movements, but    Laxatives are not a good long-term solution as it can wear the colon out. o Osmotics (Milk of Magnesia, Fleets phosphosoda, Magnesium citrate, MiraLax, GoLytely) are safer than  o Stimulants (Senokot, Castor Oil, Dulcolax, Ex Lax)    o Do not take laxatives for more than 7days in a row.    IF SEVERELY CONSTIPATED, try a Bowel Retraining Program:  o Do not use laxatives.  o Eat a diet high in roughage, such as bran cereals and leafy vegetables.  o Drink six (6) ounces of prune or apricot juice each morning.  o Eat two (2) large servings of stewed fruit each day.  o Take one (1) heaping tablespoon of a psyllium-based bulking agent twice a day. Use sugar-free sweetener when possible to avoid excessive calories.  o Eat a normal breakfast.  o Set aside 15 minutes after breakfast to sit on the  toilet, but do not strain to have a bowel movement.  o If you do not have a bowel movement by the third day, use an enema and repeat the above steps.    Controlling diarrhea o Switch to liquids and simpler foods for a few days to avoid stressing your intestines further. o Avoid dairy products (especially milk & ice cream) for a short time.  The intestines often can lose the ability to digest lactose when stressed. o Avoid foods that cause gassiness or bloating.  Typical foods include beans and other legumes, cabbage, broccoli, and dairy foods.  Every person has some sensitivity to other foods, so listen to our body and avoid those foods that trigger problems for you. o Adding fiber (Citrucel, Metamucil, psyllium, Miralax) gradually can help thicken stools by absorbing excess fluid and retrain the intestines to act more normally.  Slowly increase the dose over a few weeks.  Too much fiber too soon can backfire and cause cramping & bloating. o Probiotics (such as active yogurt, Align, etc) may help repopulate the intestines and colon with normal bacteria and calm down a sensitive digestive tract.  Most studies show it to be of mild help, though, and such products can be costly. o Medicines:   Bismuth subsalicylate (ex. Kayopectate, Pepto Bismol) every 30 minutes for up to 6 doses can help control diarrhea.  Avoid if pregnant.   Loperamide (Immodium) can slow down diarrhea.  Start with two tablets (4mg  total) first and then try one tablet every 6 hours.  Avoid if you are having fevers or severe pain.  If you are not better or start feeling worse, stop all medicines and call your doctor for advice o Call your doctor if you are getting worse or not better.  Sometimes further testing (cultures, endoscopy, X-ray studies, bloodwork, etc) may be needed to help diagnose and treat the cause of the diarrhea. o

## 2013-01-10 NOTE — Progress Notes (Addendum)
Subjective:     Patient ID: Katelyn Taylor, female   DOB: 11-30-1940, 72 y.o.   MRN: [id number]  HPI  Katelyn Taylor  [birth year] [id number]  Patient Care Team: [name] as PCP - General (Physician Assistant) [name], MD as Consulting Physician (Gastroenterology) [name], MD as Consulting Physician (Urology)  This patient is a 72 y.o.female who presents today for surgical evaluation at the request of Dr. Juanda Chance.   Reason for visit: Nausea/bloating/irregular bowels/upper abdominal pain.  Possible gallbladder etiology  Pleasant woman with numerous abdominal issues.  Has been told she has a small hiatal hernia with reflux.  That is usually controlled with Prevacid.  Has had irregular bowel habits.  Dicyclomine not helping much.  Has had upper and lower endoscopy that have been slightly underwhelming.  She has trouble with upper abdominal pain for the past few years, worse over the past two months.  She normally can eat okay but appetite is down.  [place] & spicy [place] foods bother her heartburn and reflux.  She has been transitioning to a low-fat diet.  She often gets nauseated and full and bloated in the middle of the day.  More noticeable when she turns or twists or is active.  Sometimes after meals but not always.  She usually has bowel movements in the morning.  Sometimes constipated.  Sometimes has hard bowel movements with hemorrhoidal bleeding.  Sometimes has many loose bowel movements.  She can walk a mile without difficulty.  No personal nor family history of GI/colon cancer, inflammatory bowel disease, irritable bowel syndrome, allergy such as Celiac Sprue, dietary/dairy problems, colitis, ulcers nor gastritis.  No recent sick contacts/gastroenteritis.  No travel outside the country.  No changes in diet.  Apparently gallbladder problems run in her family.  She is more inclined to blame this.  She seen her gastroenterologist.  She was noted to have fatty change in her liver on  abdominal ultrasound.  She had a hiatus scan which showed a gallbladder ejection fraction of 7%, markedly decreased.  She does not recall her abdominal pain markedly worsening with this study.  However she did feel some bloating and nausea later in the day.  However, she says it happens almost on a daily basis now.  Because her other abdominal issues seem to be stable and she was still having complaints with an abnormal gallbladder ejection fraction, Dr. Juanda Chance with gastroenterology requested surgical consultation.    Patient Active Problem List   Diagnosis Date Noted  . Nonalcoholic steatohepatitis (NASH) 01/10/2013  . Decreased gallbladder ejection franction 01/10/2013  . Nausea and bloating 01/10/2013  . Chronic abdominal pain - epigastric & RUQ 01/10/2013  . ANXIETY 02/03/2009  . DEPRESSION 01/28/2009  . DVT 01/28/2009  . EXTERNAL HEMORRHOIDS 01/28/2009  . GERD 01/28/2009  . GASTRITIS, CHRONIC 01/28/2009  . HIATAL HERNIA 01/28/2009  . HEADACHE, CHRONIC 01/28/2009    Past Medical History  Diagnosis Date  . Severe dysplasia of cervix (CIN III)   . DVT (deep venous thrombosis)   . Anticoagulated   . Osteopenia   . Fatty liver   . GERD (gastroesophageal reflux disease)   . External hemorrhoid   . Hiatal hernia   . Chronic gastritis   . Depression   . Arthritis   . Hypertension     Past Surgical History  Procedure Laterality Date  . Knee arthroscopy Right   . Vaginal hysterectomy      Partial  . Tmj arthroplasty    .  Tubal ligation      History   Social History  . Marital Status: Married    Spouse Name: N/A    Number of Children: 3  . Years of Education: N/A   Occupational History  . Housewife   . caregiver    Social History Main Topics  . Smoking status: Former Smoker    Types: Cigarettes  . Smokeless tobacco: Never Used  . Alcohol Use: No  . Drug Use: No  . Sexually Active: No   Other Topics Concern  . Not on file   Social History Narrative  .  No narrative on file    Family History  Problem Relation Age of Onset  . Melanoma Mother   . Stroke Mother   . Heart disease Mother   . Cancer Mother     melonoma  . Heart disease Father   . Breast cancer Sister 54  . Cancer Sister     breast  . Lung cancer Brother   . Cancer Brother     throat, lung  . Breast cancer Maternal Aunt     Age unknown  . Heart disease Sister   . Throat cancer Brother   . Ovarian cancer      aunt  . Colon cancer Neg Hx     Current Outpatient Prescriptions  Medication Sig Dispense Refill  . amoxicillin-clavulanate (AUGMENTIN) 875-125 MG per tablet       . aspirin 81 MG chewable tablet Chew 81 mg by mouth daily.        . B Complex Vitamins (VITAMIN B COMPLEX PO) Take 3 capsules by mouth daily.      . Cholecalciferol (VITAMIN D PO) Take by mouth.      . clindamycin (CLINDAGEL) 1 % gel       . diazepam (VALIUM) 10 MG tablet Take 1 tablet (10 mg total) by mouth as needed.  30 tablet  3  . dicyclomine (BENTYL) 10 MG capsule Take 10 mg by mouth 2 (two) times daily as needed.      . fish oil-omega-3 fatty acids 1000 MG capsule Take 1 g by mouth daily.        . furosemide (LASIX) 40 MG tablet Take 60 mg by mouth daily.       . lansoprazole (PREVACID) 30 MG capsule Take 30 mg by mouth. Takes store brand, 1-4 every day      . lisinopril (PRINIVIL,ZESTRIL) 5 MG tablet Take 5 mg by mouth daily.      . minocycline (MINOCIN,DYNACIN) 100 MG capsule       . VITAMIN E PO Take by mouth.         No current facility-administered medications for this visit.     Allergies  Allergen Reactions  . Amoxicillin   . Cephalexin     REACTION: Questionable  . Codeine   . Penicillins   . Sulfonamide Derivatives     BP 130/72  Pulse 68  Resp 16  Ht 5' (1.524 m)  Wt 168 lb 6.4 oz (76.386 kg)  BMI 32.89 kg/m2  Nm Hepato W/eject Fract  12/19/2012   *RADIOLOGY REPORT*  Clinical Data:  Dyspepsia, right upper quadrant pain  NUCLEAR MEDICINE HEPATOBILIARY IMAGING WITH  GALLBLADDER EF:  Technique: Sequential images of the abdomen were obtained for 60 minutes following intravenous administration of radiopharmaceutical.  Patient then received an infusion of 0.02 ugm/kg of CCK analog intravenously over 30 minutes, and imaging was continued for 30 minutes.  A time-activity  curve was generated from tracer within the gallbladder following CCK administration, and the gallbladder ejection fraction was calculated.  Radiopharmaceutical:  5.1 mCi Tc-22m mebrofenin Pharmaceutical:  1.5 mcg CCK  Comparison: None  Findings: Prompt tracer extraction from bloodstream, indicating normal hepatocellular function. Prompt excretion of tracer into biliary tree. Gallbladder visualized at 5 minutes. Small bowel tracer not visualized until following CCK stimulation. No hepatic retention of tracer.  Abnormal emptying of tracer from gallbladder following CCK stimulation. Calculated gallbladder ejection fraction is 7%, abnormally low. Patient experienced no symptoms following CCK administration.  IMPRESSION: Patent biliary tree. Abnormal gallbladder response to CCK stimulation with a decreased gallbladder ejection fraction of 7%.  Normal values for gallbladder ejection fraction: > 30% for exams utilizing sincalide (CCK) > 33% for exams utilizing Ensure Plus stimulation   Original Report Authenticated By: Ulyses Southward, M.D.     Review of Systems  Constitutional: Negative for fever, chills, diaphoresis, appetite change and fatigue.  HENT: Negative for ear pain, sore throat, trouble swallowing, neck pain and ear discharge.   Eyes: Negative for photophobia, discharge and visual disturbance.  Respiratory: Positive for cough and wheezing. Negative for choking, chest tightness and shortness of breath.   Cardiovascular: Negative for chest pain and palpitations.  Gastrointestinal: Positive for nausea, abdominal pain, abdominal distention and anal bleeding. Negative for vomiting, diarrhea and rectal pain.    Endocrine: Negative for cold intolerance, heat intolerance and polyuria.  Genitourinary: Negative for dysuria, frequency and difficulty urinating.  Musculoskeletal: Negative for myalgias and gait problem.  Skin: Negative for color change, pallor and rash.  Allergic/Immunologic: Negative for environmental allergies, food allergies and immunocompromised state.  Neurological: Negative for dizziness, speech difficulty, weakness and numbness.  Hematological: Negative for adenopathy.  Psychiatric/Behavioral: Negative for confusion and agitation. The patient is nervous/anxious.        Objective:   Physical Exam  Constitutional: She is oriented to person, place, and time. She appears well-developed and well-nourished. No distress.  HENT:  Head: Normocephalic.  Mouth/Throat: Oropharynx is clear and moist. No oropharyngeal exudate.  Eyes: Conjunctivae and EOM are normal. Pupils are equal, round, and reactive to light. No scleral icterus.  Neck: Normal range of motion. Neck supple. No tracheal deviation present.  Cardiovascular: Normal rate, regular rhythm and intact distal pulses.   Pulmonary/Chest: Effort normal and breath sounds normal. No stridor. No respiratory distress. She exhibits no tenderness.  Abdominal: Soft. Bowel sounds are normal. She exhibits no distension, no pulsatile liver, no abdominal bruit and no mass. There is tenderness in the right upper quadrant and epigastric area. There is no rigidity, no rebound, no guarding, no tenderness at McBurney's point and negative Murphy's sign. No hernia. Hernia confirmed negative in the ventral area, confirmed negative in the right inguinal area and confirmed negative in the left inguinal area.    Genitourinary: No vaginal discharge found.  Musculoskeletal: Normal range of motion. She exhibits no tenderness.       Right elbow: She exhibits normal range of motion.       Left elbow: She exhibits normal range of motion.       Right wrist: She  exhibits normal range of motion.       Left wrist: She exhibits normal range of motion.       Right hand: Normal strength noted.       Left hand: Normal strength noted.  Lymphadenopathy:       Head (right side): No posterior auricular adenopathy present.  Head (left side): No posterior auricular adenopathy present.    She has no cervical adenopathy.    She has no axillary adenopathy.       Right: No inguinal adenopathy present.       Left: No inguinal adenopathy present.  Neurological: She is alert and oriented to person, place, and time. No cranial nerve deficit. She exhibits normal muscle tone. Coordination normal.  Skin: Skin is warm and dry. No rash noted. She is not diaphoretic. No erythema.  Psychiatric: She has a normal mood and affect. Her speech is normal and behavior is normal. Judgment and thought content normal.       Assessment:     Chronic abdominal pain in the setting of constipation/diarrhea, reflux with hiatal hernia, fatty liver and probable steatohepatitis, decreased gallbladder ejection fraction, etc.  Story not classic for biliary colic but right upper quadrant pain and decreased gallbladder ejection fraction concerning     Plan:     There is no straightforward answer for her as her history is not straightforward and I think multiple things are going on.  I think her reflux and hiatal hernia are mild and separate issues.  I think her fatty liver/DL hepatitis is stable at this point.  I am skeptical that explains everything.  However her story is not fully classic for biliary colic either.  Obtain gastric emptying study to rule out gastroparesis.  If that is abnormal, consider Reglan or other prokinetic regimen per her gastroenterologist.  Bowel regimen with fiber supplement.  Hopefully this will help stabilize her bowels.  I noted that extremes of constipation diarrhea can exacerbate or contribute to abdominal pain, nausea, bloating, et Karie Soda.  If the gastric  emptying study is normal and she improves her bowel function with a bowel regimen BUT still has symptoms; then, I would offer cholecystectomy with the understanding that it may not work.    The anatomy & physiology of hepatobiliary & pancreatic function was discussed.  The pathophysiology of gallbladder dysfunction was discussed.  Natural history risks without surgery was discussed.   I feel the risks of no intervention will lead to serious problems that outweigh the operative risks; therefore, I recommended cholecystectomy to remove the pathology.  I explained laparoscopic techniques with possible need for an open approach.  Probable cholangiogram to evaluate the bilary tract was explained as well.    Risks such as bleeding, infection, abscess, leak, injury to other organs, need for further treatment, heart attack, death, and other risks were discussed.  I noted a good likelihood this will help address the problem.  Possibility that this will not correct all abdominal symptoms was explained.  Goals of post-operative recovery were discussed as well.  We will work to minimize complications.  An educational handout further explaining the pathology and treatment options was given as well.  Questions were answered.  I spent extra time re\re explaining this in numerous ways.  I am concerned her insight is not great at this point, but she is comfortable with me and wishes to defer on my judgment through the process.  She seems inclined to proceed with cholecystectomy but it agrees with further workup first to make sure we have ruled out/controlled other etiologies.

## 2013-01-18 ENCOUNTER — Encounter (HOSPITAL_COMMUNITY)
Admission: RE | Admit: 2013-01-18 | Discharge: 2013-01-18 | Disposition: A | Discharge status: Home / Self Care | Payer: Medicare Other | Source: Ambulatory Visit | Attending: Surgery | Admitting: Surgery

## 2013-01-18 DIAGNOSIS — R143 Flatulence: Secondary | ICD-10-CM | POA: Diagnosis not present

## 2013-01-18 DIAGNOSIS — K829 Disease of gallbladder, unspecified: Secondary | ICD-10-CM

## 2013-01-18 DIAGNOSIS — R109 Unspecified abdominal pain: Secondary | ICD-10-CM | POA: Diagnosis not present

## 2013-01-18 DIAGNOSIS — R110 Nausea: Secondary | ICD-10-CM | POA: Insufficient documentation

## 2013-01-18 DIAGNOSIS — G8929 Other chronic pain: Secondary | ICD-10-CM

## 2013-01-18 DIAGNOSIS — K7581 Nonalcoholic steatohepatitis (NASH): Secondary | ICD-10-CM

## 2013-01-18 DIAGNOSIS — R142 Eructation: Secondary | ICD-10-CM | POA: Diagnosis not present

## 2013-01-18 DIAGNOSIS — R141 Gas pain: Secondary | ICD-10-CM | POA: Insufficient documentation

## 2013-01-18 MED ORDER — TECHNETIUM TC 99M SULFUR COLLOID
2.0000 | Freq: Once | INTRAVENOUS | Status: AC | PRN
  Administered 2013-01-18: 2 via ORAL

## 2013-01-22 ENCOUNTER — Telehealth (INDEPENDENT_AMBULATORY_CARE_PROVIDER_SITE_OTHER): Payer: Self-pay

## 2013-01-22 NOTE — Telephone Encounter (Signed)
Called pt to give her the test results on her delayed gastric emptying study which were normal per Dr Michaell Cowing. I spoke to the pt about her bowel regimen to see if she hat it a little more controlled but the pt is still having the same issues of going to the bathroom. The pt can only take Miralax once in a while not everday b/c she feels she has to be beside the bathroom when taking the Miralax. I advised pt that Dr Michaell Cowing would offer a lap chole to her as long as she understood that this might not completely take all of her symptoms a way since some of her test come back normal. The pt is still having the bloating,right side pain, and back pain. The pt wishes to speak to Dr Michaell Cowing some more and ask questions about her liver. I will notify Dr Michaell Cowing to call pt. The pt understands.

## 2013-01-22 NOTE — Telephone Encounter (Signed)
Called but got no answer.  I will try again tomorrow.

## 2013-01-23 ENCOUNTER — Telehealth (INDEPENDENT_AMBULATORY_CARE_PROVIDER_SITE_OTHER): Payer: Self-pay | Admitting: Surgery

## 2013-01-25 ENCOUNTER — Telehealth (INDEPENDENT_AMBULATORY_CARE_PROVIDER_SITE_OTHER): Payer: Self-pay | Admitting: Surgery

## 2013-01-25 ENCOUNTER — Other Ambulatory Visit (INDEPENDENT_AMBULATORY_CARE_PROVIDER_SITE_OTHER): Payer: Self-pay | Admitting: Surgery

## 2013-01-25 NOTE — Telephone Encounter (Signed)
Tried to reach patient.  No success.  We will try later

## 2013-01-25 NOTE — Telephone Encounter (Signed)
Orders are being held in pending waiting for patient to call back to schedule

## 2013-01-25 NOTE — Telephone Encounter (Signed)
I was able to reach the patient.  I went over results.  I went over recommendations.  While her story is not classic, it is suspicious for biliary colic.  She has a documented decreased gallbladder ejection fraction.  Her gastric imaging study is normal.  Her gastroenterologist doubts that her hiatal hernia or reflux is a significant contributor to her symptoms.  That seemed to be a separate issue.  I recommended cholecystectomy:  The anatomy & physiology of hepatobiliary & pancreatic function was discussed.  The pathophysiology of gallbladder dysfunction was discussed.  Natural history risks without surgery was discussed.   I feel the risks of no intervention will lead to serious problems that outweigh the operative risks; therefore, I recommended cholecystectomy to remove the pathology.  I explained laparoscopic techniques with possible need for an open approach.  Probable cholangiogram to evaluate the bilary tract was explained as well.    Risks such as bleeding, infection, abscess, leak, injury to other organs, need for further treatment, heart attack, death, and other risks were discussed.  I noted a good likelihood this will help address the problem.  Possibility that this will not correct all abdominal symptoms was explained.  Goals of post-operative recovery were discussed as well.  We will work to minimize complications. Questions were answered.    The patient Seemed hesitant to make a decision.  She wondered if an MRI of the abdomen would be helpful.  I told her that would probably not be too helpful, but we could consider CT scan the abdomen and pelvis.  She hesitated to do that.  She is worried that surgery may not solve the problem.  I told her I cannot guarantee, but with the thorough workup & she has had persistent symptoms, I feel like this is a reasonable option now.  She seemed more inclined towards undergoing surgery but wondered if she should wait longer.  I told her there was no good  reason to wait anymore since she still has symptoms..  She again seemed not able to make a decision right now.  I told her That if she feels ready she can call us and we can help schedule surgery. I will post orders and recommend her to call us if she wishes to proceed with surgery.

## 2013-02-21 DIAGNOSIS — J309 Allergic rhinitis, unspecified: Secondary | ICD-10-CM | POA: Diagnosis not present

## 2013-03-12 DIAGNOSIS — Z1231 Encounter for screening mammogram for malignant neoplasm of breast: Secondary | ICD-10-CM | POA: Diagnosis not present

## 2013-03-16 ENCOUNTER — Other Ambulatory Visit (INDEPENDENT_AMBULATORY_CARE_PROVIDER_SITE_OTHER): Payer: Self-pay | Admitting: Surgery

## 2013-03-16 ENCOUNTER — Other Ambulatory Visit (INDEPENDENT_AMBULATORY_CARE_PROVIDER_SITE_OTHER): Payer: Self-pay | Admitting: *Deleted

## 2013-03-16 DIAGNOSIS — K824 Cholesterolosis of gallbladder: Secondary | ICD-10-CM

## 2013-03-16 DIAGNOSIS — K921 Melena: Secondary | ICD-10-CM

## 2013-03-16 DIAGNOSIS — D134 Benign neoplasm of liver: Secondary | ICD-10-CM | POA: Diagnosis not present

## 2013-03-16 DIAGNOSIS — K811 Chronic cholecystitis: Secondary | ICD-10-CM

## 2013-03-16 DIAGNOSIS — R109 Unspecified abdominal pain: Secondary | ICD-10-CM | POA: Diagnosis not present

## 2013-03-16 DIAGNOSIS — K7689 Other specified diseases of liver: Secondary | ICD-10-CM | POA: Diagnosis not present

## 2013-03-16 HISTORY — PX: DIAGNOSTIC LAPAROSCOPIC LIVER BIOPSY: SHX5797

## 2013-03-16 HISTORY — PX: LAPAROSCOPIC CHOLECYSTECTOMY: SUR755

## 2013-03-16 MED ORDER — TRAMADOL HCL 50 MG PO TABS: 50.0000 mg | ORAL_TABLET | Freq: Four times a day (QID) | ORAL | Status: DC | PRN

## 2013-03-19 ENCOUNTER — Telehealth (INDEPENDENT_AMBULATORY_CARE_PROVIDER_SITE_OTHER): Payer: Self-pay

## 2013-03-19 NOTE — Telephone Encounter (Signed)
Patient states she is having pain on rt side below her breast. Aleve is not helping. Denies constipation ,she had large bm yesterday, afebrile, advised her to try tramadol if no relief to call and I would inform DR. Michaell Cowing

## 2013-03-19 NOTE — Telephone Encounter (Signed)
Advised patient as Dr.Gross orders. Patient has took tramadol  With relief voiced . Advised patient to call if any other questions/concerns

## 2013-03-19 NOTE — Telephone Encounter (Signed)
I would recommend that she fill the narcotic pain prescription that was written for her.  It is common to need that especially in the early postoperative period.  As long as she is not having fevers, nausea, vomiting, horrible diarrhea, severe pain; it is reasonable to treat her symptoms more aggressively.  If the Aleve is not working, take a different over-the-counter pain medication such as Tylenol or ibuprofen.

## 2013-03-26 ENCOUNTER — Telehealth (INDEPENDENT_AMBULATORY_CARE_PROVIDER_SITE_OTHER): Payer: Self-pay | Admitting: *Deleted

## 2013-03-26 ENCOUNTER — Other Ambulatory Visit (INDEPENDENT_AMBULATORY_CARE_PROVIDER_SITE_OTHER): Payer: Self-pay | Admitting: *Deleted

## 2013-03-26 DIAGNOSIS — R1011 Right upper quadrant pain: Secondary | ICD-10-CM | POA: Diagnosis not present

## 2013-03-26 DIAGNOSIS — Z9049 Acquired absence of other specified parts of digestive tract: Secondary | ICD-10-CM

## 2013-03-26 DIAGNOSIS — Z9889 Other specified postprocedural states: Secondary | ICD-10-CM | POA: Diagnosis not present

## 2013-03-26 MED ORDER — NAPROXEN 500 MG PO TABS: 500.0000 mg | ORAL_TABLET | Freq: Two times a day (BID) | ORAL | Status: DC

## 2013-03-26 NOTE — Telephone Encounter (Signed)
I called pt to inform her of her appt information for tomorrow.  She is scheduled at [facility] radiology for her ultrasound on 10/14 @ 7:15 and she must be NPO after midnight.  She is then to go have her labs drawn at [facility] right after her ultrasound.  Her appt with Dr. Michaell Cowing is 10/14 @ 2:45. Pt understands all instructions and read back all appt information.

## 2013-03-26 NOTE — Telephone Encounter (Signed)
Add warmth/ice for pain control. Switch ibuprofen to naproxen 500mg  po BID Make sure she is not constipated Obtain CBC & CMET Obtain u/s of abdomen to r/o fluid collection Fit her into clinic this week, ideally after tests done

## 2013-03-26 NOTE — Telephone Encounter (Signed)
Patient called in this morning with same complaints as she had last week.  Patient reports continued nausea and increased pain when she takes a deep breath.  Patient reports pain is under her rib area on the right side.  Patient denies fever, chills, drainage or redness at incision site.  Patient states she has been taking the pain medication and Ibuprofen as Dr. Michaell Cowing had suggested previously.  Patient states she is also adding in Valium at night to try to help her sleep but is still not sleeping well due to pain.  Patient is asking to come in to see Dr. Michaell Cowing earlier than 04/10/13.  Explained that I will send a message to Dr. Michaell Cowing to ask his opinion on these symptoms and see if there is anything else he would like to do.  Explained that I will give her a call back just as soon as I know a plan.  Patient states understanding and agreeable at this time.

## 2013-03-26 NOTE — Telephone Encounter (Signed)
Called and left message for patient to give me a call back so I can update her with the plan below.  Awaiting patient's call back at this time.

## 2013-03-26 NOTE — Telephone Encounter (Signed)
Error

## 2013-03-27 ENCOUNTER — Encounter (INDEPENDENT_AMBULATORY_CARE_PROVIDER_SITE_OTHER): Payer: Medicare Other | Admitting: Surgery

## 2013-03-27 ENCOUNTER — Encounter (INDEPENDENT_AMBULATORY_CARE_PROVIDER_SITE_OTHER): Payer: Self-pay | Admitting: Surgery

## 2013-03-27 ENCOUNTER — Ambulatory Visit (HOSPITAL_COMMUNITY)
Admission: RE | Admit: 2013-03-27 | Discharge: 2013-03-27 | Disposition: A | Discharge status: Home / Self Care | Payer: Medicare Other | Source: Ambulatory Visit | Attending: Surgery | Admitting: Surgery

## 2013-03-27 ENCOUNTER — Ambulatory Visit (INDEPENDENT_AMBULATORY_CARE_PROVIDER_SITE_OTHER): Payer: Medicare Other | Admitting: Surgery

## 2013-03-27 VITALS — BP 122/62 | HR 74 | Resp 18 | Ht 60.0 in | Wt 166.0 lb

## 2013-03-27 DIAGNOSIS — H2510 Age-related nuclear cataract, unspecified eye: Secondary | ICD-10-CM | POA: Diagnosis not present

## 2013-03-27 DIAGNOSIS — K7581 Nonalcoholic steatohepatitis (NASH): Secondary | ICD-10-CM

## 2013-03-27 DIAGNOSIS — R1011 Right upper quadrant pain: Secondary | ICD-10-CM | POA: Insufficient documentation

## 2013-03-27 DIAGNOSIS — K7689 Other specified diseases of liver: Secondary | ICD-10-CM | POA: Diagnosis not present

## 2013-03-27 DIAGNOSIS — Z9089 Acquired absence of other organs: Secondary | ICD-10-CM | POA: Insufficient documentation

## 2013-03-27 DIAGNOSIS — F411 Generalized anxiety disorder: Secondary | ICD-10-CM

## 2013-03-27 DIAGNOSIS — Z9049 Acquired absence of other specified parts of digestive tract: Secondary | ICD-10-CM

## 2013-03-27 DIAGNOSIS — I10 Essential (primary) hypertension: Secondary | ICD-10-CM | POA: Insufficient documentation

## 2013-03-27 DIAGNOSIS — H40019 Open angle with borderline findings, low risk, unspecified eye: Secondary | ICD-10-CM | POA: Diagnosis not present

## 2013-03-27 DIAGNOSIS — K811 Chronic cholecystitis: Secondary | ICD-10-CM

## 2013-03-27 LAB — COMPREHENSIVE METABOLIC PANEL
AST: 53 U/L — ABNORMAL HIGH (ref 0–37)
Alkaline Phosphatase: 73 U/L (ref 39–117)
BUN: 10 mg/dL (ref 6–23)
CO2: 30 mEq/L (ref 19–32)
Calcium: 10.5 mg/dL (ref 8.4–10.5)
Chloride: 100 mEq/L (ref 96–112)
Creat: 0.74 mg/dL (ref 0.50–1.10)
Glucose, Bld: 102 mg/dL — ABNORMAL HIGH (ref 70–99)
Total Protein: 6.9 g/dL (ref 6.0–8.3)

## 2013-03-27 LAB — CBC WITH DIFFERENTIAL/PLATELET
Basophils Absolute: 0.1 10*3/uL (ref 0.0–0.1)
Basophils Relative: 1 % (ref 0–1)
Eosinophils Relative: 7 % — ABNORMAL HIGH (ref 0–5)
HCT: 40.7 % (ref 36.0–46.0)
Lymphs Abs: 3 10*3/uL (ref 0.7–4.0)
MCH: 31.4 pg (ref 26.0–34.0)
MCHC: 35.9 g/dL (ref 30.0–36.0)
MCV: 87.5 fL (ref 78.0–100.0)
Monocytes Absolute: 0.6 10*3/uL (ref 0.1–1.0)
RDW: 13.3 % (ref 11.5–15.5)

## 2013-03-27 NOTE — Progress Notes (Signed)
Subjective:     Patient ID: Katelyn Taylor, female   DOB: Jul 08, 1940, 72 y.o.   MRN: [id number]  HPI  Katelyn Taylor  [birth year] [id number]  Patient Care Team: [name] as PCP - General (Physician Assistant) [name], MD as Consulting Physician (Gastroenterology) [name], MD as Consulting Physician (Urology)  Procedure (Date: 03/16/2013):  Procedure: Laparoscopic cholecystectomy, Cholangiogram,  14-gauge core needle biopsy of liver  Diagnosis 1. Gallbladder - CHRONIC CHOLECYSTITIS. - NEGATIVE FOR CHOLELITHIASIS. - CHOLESTEROLOSIS. 2. Liver, needle/core biopsy - BENIGN LIVER WITH STEATOHEPATITIS. - TRICHROME STAIN DEMONSTRATES MINIMAL PORTAL FIBROSIS. - IRON STAIN IS NEGATIVE FOR HEMOSIDERIN DEPOSITION. Katelyn Taylor Pathologist, Electronic Signature (Case signed 03/19/2013  This patient returns for surgical re-evaluation.  She struggled with early postoperative pain but had refused to fill her narcotic pain prescription.  Eventually she relented and took pain medication.  Apparently she takes Valium as well and trying to take that to help her sleep.  She was concerned.  She wished to be sooner seen.  We fit her in today, POD #11.  Pain worse with deep breath and yawning also with twisting and turning.    She has struggled with constipation.  She is not taking a fiber supplement.  She is taking milk of magnesia and Dulcolax.  That has helped a little bit.  She is trying to get back her morning routine of a fiber cereal and coffee.  That usually gets things going.  She has had problems where she feels like she does not evacuate/defecate normally.  Not an issue since surgery.  No incontinence to urine or flatus.  She comes today with her daughter.  Overall the soreness in the right upper cord is getting better.  She is walking a lot.  She is in good spirits.  She was very worried about that she had a cancer or cirrhosis  Patient Active Problem List   Diagnosis Date Noted    . Chronic cholecystitis s/p lap chole 03/16/2013 03/27/2013  . Nonalcoholic steatohepatitis (NASH) 01/10/2013  . Nausea and bloating 01/10/2013  . Chronic abdominal pain - epigastric & RUQ 01/10/2013  . ANXIETY 02/03/2009  . DEPRESSION 01/28/2009  . DVT 01/28/2009  . EXTERNAL HEMORRHOIDS 01/28/2009  . GERD 01/28/2009  . GASTRITIS, CHRONIC 01/28/2009  . HIATAL HERNIA 01/28/2009  . HEADACHE, CHRONIC 01/28/2009    Past Medical History  Diagnosis Date  . Severe dysplasia of cervix (CIN III)   . DVT (deep venous thrombosis)   . Anticoagulated   . Osteopenia   . Fatty liver   . GERD (gastroesophageal reflux disease)   . External hemorrhoid   . Hiatal hernia   . Chronic gastritis   . Depression   . Arthritis   . Hypertension     Past Surgical History  Procedure Laterality Date  . Knee arthroscopy Right   . Vaginal hysterectomy      Partial  . Tmj arthroplasty    . Tubal ligation    . Laparoscopic cholecystectomy  03/16/2013    History   Social History  . Marital Status: Married    Spouse Name: N/A    Number of Children: 3  . Years of Education: N/A   Occupational History  . Housewife   . caregiver    Social History Main Topics  . Smoking status: Former Smoker    Types: Cigarettes  . Smokeless tobacco: Never Used  . Alcohol Use: No  . Drug Use: No  . Sexual Activity: No  Other Topics Concern  . Not on file   Social History Narrative  . No narrative on file    Family History  Problem Relation Age of Onset  . Melanoma Mother   . Stroke Mother   . Heart disease Mother   . Cancer Mother     melonoma  . Heart disease Father   . Breast cancer Sister 75  . Cancer Sister     breast  . Lung cancer Brother   . Cancer Brother     throat, lung  . Breast cancer Maternal Aunt     Age unknown  . Heart disease Sister   . Throat cancer Brother   . Ovarian cancer      aunt  . Colon cancer Neg Hx     Current Outpatient Prescriptions  Medication Sig  Dispense Refill  . amoxicillin-clavulanate (AUGMENTIN) 875-125 MG per tablet       . aspirin 81 MG chewable tablet Chew 81 mg by mouth daily.        . B Complex Vitamins (VITAMIN B COMPLEX PO) Take 3 capsules by mouth daily.      . Cholecalciferol (VITAMIN D PO) Take by mouth.      . clindamycin (CLINDAGEL) 1 % gel       . diazepam (VALIUM) 10 MG tablet Take 1 tablet (10 mg total) by mouth as needed.  30 tablet  3  . dicyclomine (BENTYL) 10 MG capsule Take 10 mg by mouth 2 (two) times daily as needed.      . fish oil-omega-3 fatty acids 1000 MG capsule Take 1 g by mouth daily.        . furosemide (LASIX) 40 MG tablet Take 60 mg by mouth daily.       . lansoprazole (PREVACID) 30 MG capsule Take 30 mg by mouth. Takes store brand, 1-4 every day      . lisinopril (PRINIVIL,ZESTRIL) 5 MG tablet Take 5 mg by mouth daily.      . minocycline (MINOCIN,DYNACIN) 100 MG capsule       . naproxen (NAPROSYN) 500 MG tablet Take 1 tablet (500 mg total) by mouth 2 (two) times daily with a meal.  30 tablet  0  . traMADol (ULTRAM) 50 MG tablet Take 1 tablet (50 mg total) by mouth every 6 (six) hours as needed for pain (Given at discharge from [facility]).  30 tablet  0  . VITAMIN E PO Take by mouth.         No current facility-administered medications for this visit.     Allergies  Allergen Reactions  . Amoxicillin   . Cephalexin     REACTION: Questionable  . Codeine   . Penicillins   . Sulfonamide Derivatives     BP 122/62  Pulse 74  Resp 18  Ht 5' (1.524 m)  Wt 166 lb (75.297 kg)  BMI 32.42 kg/m2  US Abdomen Complete  03/27/2013   *RADIOLOGY REPORT*  Clinical Data:  Right upper quadrant abdominal pain, evaluate for fluid collection following cholecystectomy, history of fatty liver and hypertension  COMPLETE ABDOMINAL ULTRASOUND  Comparison: Abdominal ultrasound - 06/12/2010  Findings:  Gallbladder:  Surgically absent.  There are no discrete fluid collections within the gallbladder fossa.  Common bile  duct:  Normal in size measuring 4.9 mm in diameter  Liver:  There is diffuse increased echogenicity hepatic parenchyma suggestive of hepatic steatosis.  No discrete hepatic lesions.  No intrahepatic biliary duct dilatation.  No ascites.  IVC:  Appears normal.  Pancreas:  Limited visualization of the pancreatic head and neck is normal.  Visualization of the pancreatic body and tail is obscured by bowel gas.  Spleen:  Normal in size measuring 8.1 cm in length  Right Kidney:  Normal cortical thickness, echogenicity and size, measuring 10.9 cm in length.  No focal renal lesions.  No echogenic renal stones.  No urinary obstruction.  Left Kidney:  Normal cortical thickness, echogenicity and size, measuring 11.6 cm in length.  No focal renal lesions.  No echogenic renal stones.  No urinary obstruction.  Abdominal aorta:  No aneurysm identified.  IMPRESSION: 1.  Post cholecystectomy.  There is no discrete fluid collection within the gallbladder fossa to suggest a postoperative abscess. Further evaluation may be performed with abdominal CT as clinically indicated. 2.  Hepatic steatosis.   Original Report Authenticated By: Tacey Ruiz, MD     Review of Systems  Constitutional: Negative for fever, chills and diaphoresis.  HENT: Negative for ear pain, sore throat and trouble swallowing.   Eyes: Negative for photophobia and visual disturbance.  Respiratory: Negative for cough and choking.   Cardiovascular: Negative for chest pain and palpitations.  Gastrointestinal: Positive for abdominal pain. Negative for nausea, vomiting, diarrhea, constipation, anal bleeding and rectal pain.  Genitourinary: Negative for dysuria, frequency and difficulty urinating.  Musculoskeletal: Negative for gait problem and myalgias.  Skin: Negative for color change, pallor and rash.  Neurological: Negative for dizziness, speech difficulty, weakness and numbness.  Hematological: Negative for adenopathy.  Psychiatric/Behavioral: Negative  for confusion and agitation. The patient is not nervous/anxious.        Objective:   Physical Exam  Constitutional: She is oriented to person, place, and time. She appears well-developed and well-nourished. No distress.  HENT:  Head: Normocephalic.  Mouth/Throat: Oropharynx is clear and moist. No oropharyngeal exudate.  Eyes: Conjunctivae and EOM are normal. Pupils are equal, round, and reactive to light. No scleral icterus.  Neck: Normal range of motion. No tracheal deviation present.  Cardiovascular: Normal rate and intact distal pulses.   Pulmonary/Chest: Effort normal. No respiratory distress. She exhibits no tenderness.  Abdominal: Soft. She exhibits no distension. There is tenderness in the right upper quadrant and periumbilical area. There is no rigidity, no rebound, no guarding, no CVA tenderness, no tenderness at McBurney's point and negative Murphy's sign. No hernia. Hernia confirmed negative in the ventral area, confirmed negative in the right inguinal area and confirmed negative in the left inguinal area.    Incisions clean with normal healing ridges.  No hernias  Genitourinary: No vaginal discharge found.  Musculoskeletal: Normal range of motion. She exhibits no tenderness.  Lymphadenopathy:       Right: No inguinal adenopathy present.       Left: No inguinal adenopathy present.  Neurological: She is alert and oriented to person, place, and time. No cranial nerve deficit. She exhibits normal muscle tone. Coordination normal.  Skin: Skin is warm and dry. No rash noted. She is not diaphoretic.  Psychiatric: She has a normal mood and affect. Her behavior is normal.       Assessment:     Doing relatively well only 11 days out from cholecystectomy and liver biopsy and anxious patient     Plan:     I reviewed the pathology with her.  She was relieved to know That the findings were benign.  No cirrhosis.  No cancer.  Increase activity as tolerated to regular activity.  Low  impact exercise such as walking an hour a day at least ideal.  Taylor not push through pain.  I strongly recommend she control her pain and take our recommendations.  Diet as tolerated.  Low fat high fiber diet ideal.  Bowel regimen with 30 g fiber a day and fiber supplement as needed to avoid problems.  I strongly recommend she get back on a fiber bowel regimen.  Hopefully with a fiber bowel regimen and capable pelvic floor exercises, her defecation issues will improve.  If not, consider pelvic floor physical therapy and/or reevaluation by her gastroenterologist.  Return to clinic in three weeks to make sure she continues to improve.  I think she mainly needed reassurance.  Instructions discussed.  She claims her husband lost all the postoperative instruction handouts.  I printed them out again. Followup with primary care physician for other health issues as would normally be done.  Questions answered.  The patient expressed understanding and appreciation

## 2013-03-27 NOTE — Patient Instructions (Signed)
LAPAROSCOPIC SURGERY: POST OP INSTRUCTIONS  1. DIET: Follow a light bland diet the first 24 hours after arrival home, such as soup, liquids, crackers, etc.  Be sure to include lots of fluids daily.  Avoid fast food or heavy meals as your are more likely to get nauseated.  Eat a low fat the next few days after surgery.   2. Take your usually prescribed home medications unless otherwise directed. 3. PAIN CONTROL: a. Pain is best controlled by a usual combination of three different methods TOGETHER: i. Ice/Heat ii. Over the counter pain medication iii. Prescription pain medication b. Most patients will experience some swelling and bruising around the incisions.  Ice packs or heating pads (30-60 minutes up to 6 times a day) will help. Use ice for the first few days to help decrease swelling and bruising, then switch to heat to help relax tight/sore spots and speed recovery.  Some people prefer to use ice alone, heat alone, alternating between ice & heat.  Experiment to what works for you.  Swelling and bruising can take several weeks to resolve.   c. It is helpful to take an over-the-counter pain medication regularly for the first few weeks.  Choose one of the following that works best for you: i. Naproxen (Aleve, etc)  Two 236m tabs twice a day ii. Ibuprofen (Advil, etc) Three 2060mtabs four times a day (every meal & bedtime) iii. Acetaminophen (Tylenol, etc) 500-65025mour times a day (every meal & bedtime) d. A  prescription for pain medication (such as oxycodone, hydrocodone, etc) should be given to you upon discharge.  Take your pain medication as prescribed.  i. If you are having problems/concerns with the prescription medicine (does not control pain, nausea, vomiting, rash, itching, etc), please call us [phone] see if we need to switch you to a different pain medicine that will work better for you and/or control your side effect better. ii. If you need a refill on your pain medication,  please contact your pharmacy.  They will contact our office to request authorization. Prescriptions will not be filled after 5 pm or on week-ends. 4. Avoid getting constipated.  Between the surgery and the pain medications, it is common to experience some constipation.  Increasing fluid intake and taking a fiber supplement (such as Metamucil, Citrucel, FiberCon, MiraLax, etc) 1-2 times a day regularly will usually help prevent this problem from occurring.  A mild laxative (prune juice, Milk of Magnesia, MiraLax, etc) should be taken according to package directions if there are no bowel movements after 48 hours.   5. Watch out for diarrhea.  If you have many loose bowel movements, simplify your diet to bland foods & liquids for a few days.  Stop any stool softeners and decrease your fiber supplement.  Switching to mild anti-diarrheal medications (Kayopectate, Pepto Bismol) can help.  If this worsens or does not improve, please call us.[place]. Wash / shower every day.  You may shower over the dressings as they are waterproof.  Continue to shower over incision(s) after the dressing is off. 7. Remove your waterproof bandages 5 days after surgery.  You may leave the incision open to air.  You may replace a dressing/Band-Aid to cover the incision for comfort if you wish.  8. ACTIVITIES as tolerated:   a. You may resume regular (light) daily activities beginning the next day-such as daily self-care, walking, climbing stairs-gradually increasing activities as tolerated.  If you can walk 30 minutes without difficulty, it  is safe to try more intense activity such as jogging, treadmill, bicycling, low-impact aerobics, swimming, etc. b. Save the most intensive and strenuous activity for last such as sit-ups, heavy lifting, contact sports, etc  Refrain from any heavy lifting or straining until you are off narcotics for pain control.   c. DO NOT PUSH THROUGH PAIN.  Let pain be your guide: If it hurts to do something, don't do  it.  Pain is your body warning you to avoid that activity for another week until the pain goes down. d. You may drive when you are no longer taking prescription pain medication, you can comfortably wear a seatbelt, and you can safely maneuver your car and apply brakes. e. You may have sexual intercourse when it is comfortable.  9. FOLLOW UP in our office a. Please call [facility] at [phone] to set up an appointment to see your surgeon in the office for a follow-up appointment approximately 2-3 weeks after your surgery. b. Make sure that you call for this appointment the day you arrive home to insure a convenient appointment time. 10. IF YOU HAVE DISABILITY OR FAMILY LEAVE FORMS, BRING THEM TO THE OFFICE FOR PROCESSING.  DO NOT GIVE THEM TO YOUR DOCTOR.   WHEN TO CALL US [phone]: 1. Poor pain control 2. Reactions / problems with new medications (rash/itching, nausea, etc)  3. Fever over 101.5 F (38.5 C) 4. Inability to urinate 5. Nausea and/or vomiting 6. Worsening swelling or bruising 7. Continued bleeding from incision. 8. Increased pain, redness, or drainage from the incision   The clinic staff is available to answer your questions during regular business hours (8:30am-5pm).  Please don't hesitate to call and ask to speak to one of our nurses for clinical concerns.   If you have a medical emergency, go to the nearest emergency room or call 911.  A surgeon from [facility] is always on call at the hospitals   [facility] [address], [address], [place], [place]  [address] ? MAIN: [phone] ? TOLL FREE: [phone] ?  FAX [phone] [email or web address]  GETTING TO GOOD BOWEL HEALTH. Irregular bowel habits such as constipation and diarrhea can lead to many problems over time.  Having one soft bowel movement a day is the most important way to prevent further problems.  The anorectal canal is designed to handle stretching  and feces to safely manage our ability to get rid of solid waste (feces, poop, stool) out of our body.  BUT, hard constipated stools can act like ripping concrete bricks and diarrhea can be a burning fire to this very sensitive area of our body, causing inflamed hemorrhoids, anal fissures, increasing risk is perirectal abscesses, abdominal pain/bloating, an making irritable bowel worse.     The goal: ONE SOFT BOWEL MOVEMENT A DAY!  To have soft, regular bowel movements:    Drink at least 8 tall glasses of water a day.     Take plenty of fiber.  Fiber is the undigested part of plant food that passes into the colon, acting s "natures broom" to encourage bowel motility and movement.  Fiber can absorb and hold large amounts of water. This results in a larger, bulkier stool, which is soft and easier to pass. Work gradually over several weeks up to 6 servings a day of fiber (25g a day even more if needed) in the form of: o Vegetables -- Root (potatoes, carrots, turnips), leafy green (lettuce, salad greens, celery,   spinach), or cooked high residue (cabbage, broccoli, etc) o Fruit -- Fresh (unpeeled skin & pulp), Dried (prunes, apricots, cherries, etc ),  or stewed ( applesauce)  o Whole grain breads, pasta, etc (whole wheat)  o Bran cereals    Bulking Agents -- This type of water-retaining fiber generally is easily obtained each day by one of the following:  o Psyllium bran -- The psyllium plant is remarkable because its ground seeds can retain so much water. This product is available as Metamucil, Konsyl, Effersyllium, Per Diem Fiber, or the less expensive generic preparation in drug and health food stores. Although labeled a laxative, it really is not a laxative.  o Methylcellulose -- This is another fiber derived from wood which also retains water. It is available as Citrucel. o Polyethylene Glycol - and "artificial" fiber commonly called Miralax or Glycolax.  It is helpful for people with gassy or bloated  feelings with regular fiber o Flax Seed - a less gassy fiber than psyllium   No reading or other relaxing activity while on the toilet. If bowel movements take longer than 5 minutes, you are too constipated   AVOID CONSTIPATION.  High fiber and water intake usually takes care of this.  Sometimes a laxative is needed to stimulate more frequent bowel movements, but    Laxatives are not a good long-term solution as it can wear the colon out. o Osmotics (Milk of Magnesia, Fleets phosphosoda, Magnesium citrate, MiraLax, GoLytely) are safer than  o Stimulants (Senokot, Castor Oil, Dulcolax, Ex Lax)    o Do not take laxatives for more than 7days in a row.    IF SEVERELY CONSTIPATED, try a Bowel Retraining Program: o Do not use laxatives.  o Eat a diet high in roughage, such as bran cereals and leafy vegetables.  o Drink six (6) ounces of prune or apricot juice each morning.  o Eat two (2) large servings of stewed fruit each day.  o Take one (1) heaping tablespoon of a psyllium-based bulking agent twice a day. Use sugar-free sweetener when possible to avoid excessive calories.  o Eat a normal breakfast.  o Set aside 15 minutes after breakfast to sit on the toilet, but do not strain to have a bowel movement.  o If you do not have a bowel movement by the third day, use an enema and repeat the above steps.    Controlling diarrhea o Switch to liquids and simpler foods for a few days to avoid stressing your intestines further. o Avoid dairy products (especially milk & ice cream) for a short time.  The intestines often can lose the ability to digest lactose when stressed. o Avoid foods that cause gassiness or bloating.  Typical foods include beans and other legumes, cabbage, broccoli, and dairy foods.  Every person has some sensitivity to other foods, so listen to our body and avoid those foods that trigger problems for you. o Adding fiber (Citrucel, Metamucil, psyllium, Miralax) gradually can help thicken  stools by absorbing excess fluid and retrain the intestines to act more normally.  Slowly increase the dose over a few weeks.  Too much fiber too soon can backfire and cause cramping & bloating. o Probiotics (such as active yogurt, Align, etc) may help repopulate the intestines and colon with normal bacteria and calm down a sensitive digestive tract.  Most studies show it to be of mild help, though, and such products can be costly. o Medicines:   Bismuth subsalicylate (ex. Kayopectate, [place]) every  30 minutes for up to 6 doses can help control diarrhea.  Avoid if pregnant.   Loperamide (Immodium) can slow down diarrhea.  Start with two tablets (4mg  total) first and then try one tablet every 6 hours.  Avoid if you are having fevers or severe pain.  If you are not better or start feeling worse, stop all medicines and call your doctor for advice o Call your doctor if you are getting worse or not better.  Sometimes further testing (cultures, endoscopy, X-ray studies, bloodwork, etc) may be needed to help diagnose and treat the cause of the diarrhea.  Managing Pain  Pain after surgery or related to activity is often due to strain/injury to muscle, tendon, nerves and/or incisions.  This pain is usually short-term and will improve in a few months.   Many people find it helpful to do the following things TOGETHER to help speed the process of healing and to get back to regular activity more quickly:  1. Avoid heavy physical activity a.  no lifting greater than 20 pounds b. Do not "push through" the pain.  Listen to your body and avoid positions and maneuvers than reproduce the pain c. Walking is okay as tolerated, but go slowly and stop when getting sore.  d. Remember: If it hurts to do it, then don't do it! 2. Take Anti-inflammatory medication  a. Take with food/snack around the clock for 1-2 weeks i. This helps the muscle and nerve tissues become less irritable and calm down faster b. Choose ONE  of the following over-the-counter medications: i. Naproxen 220mg  tabs (ex. Aleve) 1-2 pills twice a day  ii. Ibuprofen 200mg  tabs (ex. Advil, Motrin) 3-4 pills with every meal and just before bedtime iii. Acetaminophen 500mg  tabs (Tylenol) 1-2 pills with every meal and just before bedtime 3. Use a Heating pad or Ice/Cold Pack a. 4-6 times a day b. May use warm bath/hottub  or showers 4. Try Gentle Massage and/or Stretching  a. at the area of pain many times a day b. stop if you feel pain - do not overdo it  Try these steps together to help you body heal faster and avoid making things get worse.  Doing just one of these things may not be enough.    If you are not getting better after two weeks or are noticing you are getting worse, contact our office for further advice; we may need to re-evaluate you & see what other things we can do to help.  Pelvic floor muscle training exercises  can help strengthen the muscles under the uterus, bladder, and bowel (large intestine). They can help both men and women who have problems with urine leakage or bowel control.  A pelvic floor muscle training exercise is like pretending that you have to urinate, and then holding it. You relax and tighten the muscles that control urine flow. It's important to find the right muscles to tighten.  The next time you have to urinate, start to go and then stop. Feel the muscles in your vagina, bladder, or anus get tight and move up. These are the pelvic floor muscles. If you feel them tighten, you've done the exercise right. If you are still not sure whether you are tightening the right muscles, keep in mind that all of the muscles of the pelvic floor relax and contract at the same time. Because these muscles control the bladder, rectum, and vagina, the following tips may help: Women: Insert a finger into your vagina. Tighten  the muscles as if you are holding in your urine, then let go. You should feel the muscles tighten  and move up and down.  Men: Insert a finger into your rectum. Tighten the muscles as if you are holding in your urine, then let go. You should feel the muscles tighten and move up and down. These are the same muscles you would tighten if you were trying to prevent yourself from passing gas.  It is very important that you keep the following muscles relaxed while doing pelvic floor muscle training exercises: Abdominal  Buttocks (the deeper, anal sphincter muscle should contract)  Thigh   A woman can also strengthen these muscles by using a vaginal cone, which is a weighted device that is inserted into the vagina. Then you try to tighten the pelvic floor muscles to hold the device in place. If you are unsure whether you are doing the pelvic floor muscle training correctly, you can use biofeedback and electrical stimulation to help find the correct muscle group to work. Biofeedback is a method of positive reinforcement. Electrodes are placed on the abdomen and along the anal area. Some therapists place a sensor in the vagina in women or anus in men to monitor the contraction of pelvic floor muscles.  A monitor will display a graph showing which muscles are contracting and which are at rest. The therapist can help find the right muscles for performing pelvic floor muscle training exercises.   PERFORMING PELVIC FLOOR EXERCISES: 1. Begin by emptying your bladder. 2. Tighten the pelvic floor muscles and hold for a count of 10. 3. Relax the muscles completely for a count of 10. 4. Do 10 repititions, 3 to 5 times a day (morning, afternoon, and night). You can do these exercises at any time and any place. Most people prefer to do the exercises while lying down or sitting in a chair. After 4 - 6 weeks, most people notice some improvement. It may take as long as 3 months to see a major change. After a couple of weeks, you can also try doing a single pelvic floor contraction at times when you are likely to leak  (for example, while getting out of a chair). A word of caution: Some people feel that they can speed up the progress by increasing the number of repetitions and the frequency of exercises. However, over-exercising can instead cause muscle fatigue and increase urine leakage. If you feel any discomfort in your abdomen or back while doing these exercises, you are probably doing them wrong. Breathe deeply and relax your body when you are doing these exercises. Make sure you are not tightening your stomach, thigh, buttock, or chest muscles. When done the right way, pelvic floor muscle exercises have been shown to be very effective at improving urinary continence. Alternative Names Kegel exercises    Fatty Liver Fatty liver is the accumulation of fat in liver cells. It is also called hepatosteatosis or steatohepatitis. It is normal for your liver to contain some fat. If fat is more than 5 to 10% of your liver's weight, you have fatty liver.  There are often no symptoms (problems) for years while damage is still occurring. People often learn about their fatty liver when they have medical tests for other reasons. Fat can damage your liver for years or even decades without causing problems. When it becomes severe, it can cause fatigue, weight loss, weakness, and confusion. This makes you more likely to develop more serious liver problems. The liver  is the largest organ in the body. It does a lot of work and often gives no warning signs when it is sick until late in a disease. The liver has many important jobs including:  Breaking down foods.  Storing vitamins, iron, and other minerals.  Making proteins.  Making bile for food digestion.  Breaking down many products including medications, alcohol and some poisons. CAUSES  There are a number of different conditions, medications, and poisons that can cause a fatty liver. Eating too many calories causes fat to build up in the liver. Not processing and  breaking fats down normally may also cause this. Certain conditions, such as obesity, diabetes, and high triglycerides also cause this. Most fatty liver patients tend to be middle-aged and over weight.  Some causes of fatty liver are:  Alcohol over consumption.  Malnutrition.  Steroid use.  Valproic acid toxicity.  Obesity.  Cushing's syndrome.  Poisons.  Tetracycline in high dosages.  Pregnancy.  Diabetes.  Hyperlipidemia.  Rapid weight loss. Some people develop fatty liver even having none of these conditions. SYMPTOMS  Fatty liver most often causes no problems. This is called asymptomatic.  It can be diagnosed with blood tests and also by a liver biopsy.  It is one of the most common causes of minor elevations of liver enzymes on routine blood tests.  Specialized Imaging of the liver using ultrasound, CT (computed tomography) scan, or MRI (magnetic resonance imaging) can suggest a fatty liver but a biopsy is needed to confirm it.  A biopsy involves taking a small sample of liver tissue. This is done by using a needle. It is then looked at under a microscope by a specialist. TREATMENT  It is important to treat the cause. Simple fatty liver without a medical reason may not need treatment.  Weight loss, fat restriction, and exercise in overweight patients produces inconsistent results but is worth trying.  Fatty liver due to alcohol toxicity may not improve even with stopping drinking.  Good control of diabetes may reduce fatty liver.  Lower your triglycerides through diet, medication or both.  Eat a balanced, healthy diet.  Increase your physical activity.  Get regular checkups from a liver specialist.  There are no medical or surgical treatments for a fatty liver or NASH, but improving your diet and increasing your exercise may help prevent or reverse some of the damage. PROGNOSIS  Fatty liver may cause no damage or it can lead to an inflammation of the  liver. This is, called steatohepatitis. When it is linked to alcohol abuse, it is called alcoholic steatohepatitis. It often is not linked to alcohol. It is then called nonalcoholic steatohepatitis, or NASH. Over time the liver may become scarred and hardened. This condition is called cirrhosis. Cirrhosis is serious and may lead to liver failure or cancer. NASH is one of the leading causes of cirrhosis. About 10-20% of Americans have fatty liver and a smaller 2-5% has NASH. Document Released: 07/16/2005 Document Revised: 08/23/2011 Document Reviewed: 09/08/2005 [facility] Patient Information 2014 [place], [place].

## 2013-03-28 ENCOUNTER — Other Ambulatory Visit: Payer: Medicare Other

## 2013-03-28 ENCOUNTER — Telehealth (INDEPENDENT_AMBULATORY_CARE_PROVIDER_SITE_OTHER): Payer: Self-pay

## 2013-03-28 NOTE — Telephone Encounter (Signed)
Called pt to notify her that we did receive her lab results and everything looks ok per Dr Michaell Cowing. I advised pt that her WBC is not elevated and hgb is normal per Dr Michaell Cowing. I did advise pt that on her CMET the AST/ALT is elevated but the same as 3 months a go due to her fatty liver nothing to be concerned about per Dr Michaell Cowing. The pt understands.

## 2013-03-28 NOTE — Telephone Encounter (Signed)
Daughter states her mother had two nose bleeds this am, she is on aspirin 81mg  qd and lisinopril qd.This is  unusual for her to have this. I advised for her to call her PcP and I would inform DR. Gross.

## 2013-03-28 NOTE — Telephone Encounter (Signed)
I do not see that is related to the surgery.  Consider antihistamines and saline nasal drops.  Defer to primary care physician on management of this.

## 2013-04-10 ENCOUNTER — Encounter (INDEPENDENT_AMBULATORY_CARE_PROVIDER_SITE_OTHER): Payer: Medicare Other | Admitting: Surgery

## 2013-04-10 DIAGNOSIS — L719 Rosacea, unspecified: Secondary | ICD-10-CM | POA: Diagnosis not present

## 2013-04-10 DIAGNOSIS — L219 Seborrheic dermatitis, unspecified: Secondary | ICD-10-CM | POA: Diagnosis not present

## 2013-04-10 DIAGNOSIS — L821 Other seborrheic keratosis: Secondary | ICD-10-CM | POA: Diagnosis not present

## 2013-04-13 ENCOUNTER — Encounter (HOSPITAL_COMMUNITY): Payer: Self-pay | Admitting: Emergency Medicine

## 2013-04-13 ENCOUNTER — Emergency Department (HOSPITAL_COMMUNITY)
Admission: EM | Admit: 2013-04-13 | Discharge: 2013-04-14 | Disposition: A | Discharge status: Home / Self Care | Payer: Medicare Other | Attending: Emergency Medicine | Admitting: Emergency Medicine

## 2013-04-13 DIAGNOSIS — Z880 Allergy status to penicillin: Secondary | ICD-10-CM | POA: Insufficient documentation

## 2013-04-13 DIAGNOSIS — M129 Arthropathy, unspecified: Secondary | ICD-10-CM | POA: Diagnosis not present

## 2013-04-13 DIAGNOSIS — I10 Essential (primary) hypertension: Secondary | ICD-10-CM | POA: Insufficient documentation

## 2013-04-13 DIAGNOSIS — Z87891 Personal history of nicotine dependence: Secondary | ICD-10-CM | POA: Diagnosis not present

## 2013-04-13 DIAGNOSIS — Z8659 Personal history of other mental and behavioral disorders: Secondary | ICD-10-CM | POA: Diagnosis not present

## 2013-04-13 DIAGNOSIS — Z7982 Long term (current) use of aspirin: Secondary | ICD-10-CM | POA: Diagnosis not present

## 2013-04-13 DIAGNOSIS — Z8742 Personal history of other diseases of the female genital tract: Secondary | ICD-10-CM | POA: Insufficient documentation

## 2013-04-13 DIAGNOSIS — Z86718 Personal history of other venous thrombosis and embolism: Secondary | ICD-10-CM | POA: Insufficient documentation

## 2013-04-13 DIAGNOSIS — K219 Gastro-esophageal reflux disease without esophagitis: Secondary | ICD-10-CM | POA: Diagnosis not present

## 2013-04-13 DIAGNOSIS — Z79899 Other long term (current) drug therapy: Secondary | ICD-10-CM | POA: Diagnosis not present

## 2013-04-13 DIAGNOSIS — R040 Epistaxis: Secondary | ICD-10-CM

## 2013-04-13 DIAGNOSIS — Z7901 Long term (current) use of anticoagulants: Secondary | ICD-10-CM | POA: Diagnosis not present

## 2013-04-13 LAB — PROTIME-INR
INR: 0.89 (ref 0.00–1.49)
Prothrombin Time: 11.9 seconds (ref 11.6–15.2)

## 2013-04-13 LAB — CBC WITH DIFFERENTIAL/PLATELET
Basophils Absolute: 0.1 10*3/uL (ref 0.0–0.1)
Basophils Relative: 1 % (ref 0–1)
Eosinophils Absolute: 0.6 10*3/uL (ref 0.0–0.7)
HCT: 38.5 % (ref 36.0–46.0)
MCH: 31.3 pg (ref 26.0–34.0)
MCHC: 35.3 g/dL (ref 30.0–36.0)
Monocytes Absolute: 0.7 10*3/uL (ref 0.1–1.0)
Neutro Abs: 3.7 10*3/uL (ref 1.7–7.7)
Neutrophils Relative %: 40 % — ABNORMAL LOW (ref 43–77)
RDW: 12.3 % (ref 11.5–15.5)

## 2013-04-13 LAB — COMPREHENSIVE METABOLIC PANEL
AST: 35 U/L (ref 0–37)
Albumin: 3.8 g/dL (ref 3.5–5.2)
Alkaline Phosphatase: 57 U/L (ref 39–117)
BUN: 14 mg/dL (ref 6–23)
Calcium: 9.1 mg/dL (ref 8.4–10.5)
Chloride: 105 mEq/L (ref 96–112)
Creatinine, Ser: 0.65 mg/dL (ref 0.50–1.10)
GFR calc Af Amer: >90 mL/min (ref >90)
GFR calc non Af Amer: 88 mL/min — ABNORMAL LOW (ref >90)
Glucose, Bld: 108 mg/dL — ABNORMAL HIGH (ref 70–99)
Potassium: 3.6 mEq/L (ref 3.5–5.1)
Total Bilirubin: 0.4 mg/dL (ref 0.3–1.2)
Total Protein: 6.8 g/dL (ref 6.0–8.3)

## 2013-04-13 MED ORDER — ONDANSETRON 4 MG PO TBDP
8.0000 mg | ORAL_TABLET | Freq: Once | ORAL | Status: AC
  Administered 2013-04-13: 8 mg via ORAL
  Filled 2013-04-13: qty 2

## 2013-04-13 MED ORDER — OXYMETAZOLINE HCL 0.05 % NA SOLN
2.0000 | Freq: Once | NASAL | Status: AC
  Administered 2013-04-13: 2 via NASAL
  Filled 2013-04-13: qty 15

## 2013-04-13 NOTE — ED Notes (Signed)
Pt. reports right nare bleeding since 5 pm this afternoon , denies injury , pt. takes baby ASA daily.

## 2013-04-13 NOTE — ED Notes (Signed)
H/o htn, here for nose bleed, R nare, onset 1700, then again at 2100, unable to stop the bleeding, takes ASA daily, use to be on coumadin for DVT & PE, no meds prior to arrival to stop bleeding, took lisinopril today, (denies: sob, nausea, dizziness, feeling cold, weakness, HA or other sx). Family at [facility]. Pt holding self pressure, ice pack applied to bridge of nose, VSS.

## 2013-04-14 NOTE — ED Notes (Signed)
Dr. Preston Fleeting at [facility], nasal tampon placed R nare, pt tolerating well, alert, NAD, calm, interactive, husband at [facility].

## 2013-04-14 NOTE — ED Notes (Signed)
Wait explained, alert, NAD, calm, interactive, continues to holding pressure, bleeding controlled, has slowed, may have stopped, clot in place R nare. VSS.

## 2013-04-14 NOTE — ED Provider Notes (Signed)
CSN: [id number]     Arrival date & time 04/13/13  2228 History   First MD Initiated Contact with Patient 04/14/13 0048     Chief Complaint  Patient presents with  . Epistaxis   (Consider location/radiation/quality/duration/timing/severity/associated sxs/prior Treatment) Patient is a 72 y.o. female presenting with nosebleeds. The history is provided by the patient.  Epistaxis She has been having intermittent nosebleeds from the right nostril since 5 PM. She says she has had 4 or 5 other episodes of bleeding from the right side of her nose over the last several months. She denies any trauma. She is on low dose aspirin but no other anticoagulants. She denies any trauma. Bleeding started after she blew her nose. She tried applying ice which did not seem to help. It had helped the other episodes of bleeding that she has had. She is on aspirin because of a pulmonary embolism 40 years ago.  Past Medical History  Diagnosis Date  . Severe dysplasia of cervix (CIN III)   . DVT (deep venous thrombosis)   . Anticoagulated   . Osteopenia   . Fatty liver   . GERD (gastroesophageal reflux disease)   . External hemorrhoid   . Hiatal hernia   . Chronic gastritis   . Depression   . Arthritis   . Hypertension    Past Surgical History  Procedure Laterality Date  . Knee arthroscopy Right   . Vaginal hysterectomy      Partial  . Tmj arthroplasty    . Tubal ligation    . Laparoscopic cholecystectomy  03/16/2013   Family History  Problem Relation Age of Onset  . Melanoma Mother   . Stroke Mother   . Heart disease Mother   . Cancer Mother     melonoma  . Heart disease Father   . Breast cancer Sister 61  . Cancer Sister     breast  . Lung cancer Brother   . Cancer Brother     throat, lung  . Breast cancer Maternal Aunt     Age unknown  . Heart disease Sister   . Throat cancer Brother   . Ovarian cancer      aunt  . Colon cancer Neg Hx    History  Substance Use Topics  . Smoking  status: Former Smoker    Types: Cigarettes  . Smokeless tobacco: Never Used  . Alcohol Use: No   OB History   Grav Para Term Preterm Abortions TAB SAB Ect Mult Living   3 3 3       3      Review of Systems  HENT: Positive for nosebleeds.   All other systems reviewed and are negative.    Allergies  Amoxicillin; Cephalexin; Codeine; Nsaids; Penicillins; and Sulfonamide derivatives  Home Medications   Current Outpatient Rx  Name  Route  Sig  Dispense  Refill  . aspirin 81 MG chewable tablet   Oral   Chew 81 mg by mouth daily.          . B Complex Vitamins (VITAMIN B COMPLEX PO)   Oral   Take 3 capsules by mouth daily.         . Cholecalciferol (VITAMIN D PO)   Oral   Take 1 tablet by mouth daily.          . clindamycin (CLINDAGEL) 1 % gel   Topical   Apply 1 application topically 2 (two) times daily as needed (facial rash).          Marland Kitchen  diazepam (VALIUM) 10 MG tablet   Oral   Take 1 tablet (10 mg total) by mouth as needed.   30 tablet   3   . fish oil-omega-3 fatty acids 1000 MG capsule   Oral   Take 1 g by mouth daily.          . furosemide (LASIX) 40 MG tablet   Oral   Take 60 mg by mouth daily.          Marland Kitchen ketoconazole (NIZORAL) 2 % cream   Topical   Apply 1 application topically daily.          . lansoprazole (PREVACID) 30 MG capsule   Oral   Take 30 mg by mouth daily.          Marland Kitchen lisinopril (PRINIVIL,ZESTRIL) 5 MG tablet   Oral   Take 5 mg by mouth daily.         . metroNIDAZOLE (METROGEL) 0.75 % gel   Topical   Apply 1 application topically 2 (two) times daily.          Marland Kitchen VITAMIN E PO   Oral   Take 1 tablet by mouth daily.           BP 146/57  Pulse 78  Temp(Src) 97.7 F (36.5 C) (Oral)  Resp 16  Wt 169 lb 6.4 oz (76.839 kg)  BMI 33.08 kg/m2  SpO2 94% Physical Exam  Nursing note and vitals reviewed.  72 year old female, resting comfortably and in no acute distress. Vital signs are  significant for hypertension  with blood pressure 170/74 . Oxygen saturation is 98%, which is normal. Head is normocephalic and atraumatic. PERRLA, EOMI. Oropharynx is clear.bleeding site is identified on the right side of the nasal septum and Kiesselbach plexus.  Neck is nontender and supple without adenopathy or JVD. Back is nontender and there is no CVA tenderness. Lungs are clear without rales, wheezes, or rhonchi. Chest is nontender. Heart has regular rate and rhythm without murmur. Abdomen is soft, flat, nontender without masses or hepatosplenomegaly and peristalsis is normoactive. Extremities have no cyanosis or edema, full range of motion is present. Skin is warm and dry without rash. Neurologic: Mental status is normal, cranial nerves are intact, there are no motor or sensory deficits.  ED Course  EPISTAXIS MANAGEMENT Date/Time: 04/14/2013 1:00 AM Performed by: Dione Booze Authorized by: Preston Fleeting, Leinaala Catanese Consent: Verbal consent obtained. written consent not obtained. Risks and benefits: risks, benefits and alternatives were discussed Consent given by: patient Patient understanding: patient states understanding of the procedure being performed Patient consent: the patient's understanding of the procedure matches consent given Procedure consent: procedure consent matches procedure scheduled Relevant documents: relevant documents present and verified Site marked: the operative site was marked Required items: required blood products, implants, devices, and special equipment available Patient identity confirmed: verbally with patient and arm band Time out: Immediately prior to procedure a "time out" was called to verify the correct patient, procedure, equipment, support staff and site/side marked as required. Anesthesia method: none. Patient sedated: no Treatment site: right Kiesselbach's area Repair method: anterior pack (4.5 cm Rapid Rhino) Post-procedure assessment: bleeding stopped Treatment complexity:  simple Patient tolerance: Patient tolerated the procedure well with no immediate complications.   (including critical care time) Labs Review Results for orders placed during the hospital encounter of 04/13/13  CBC WITH DIFFERENTIAL      Result Value Range   WBC 9.5  4.0 - 10.5 K/uL   RBC 4.34  3.87 - 5.11 MIL/uL   Hemoglobin 13.6  12.0 - 15.0 g/dL   HCT 16.1  09.6 - 04.5 %   MCV 88.7  78.0 - 100.0 fL   MCH 31.3  26.0 - 34.0 pg   MCHC 35.3  30.0 - 36.0 g/dL   RDW 40.9  81.1 - 91.4 %   Platelets 269  150 - 400 K/uL   Neutrophils Relative % 40 (*) 43 - 77 %   Neutro Abs 3.7  1.7 - 7.7 K/uL   Lymphocytes Relative 47 (*) 12 - 46 %   Lymphs Abs 4.5 (*) 0.7 - 4.0 K/uL   Monocytes Relative 7  3 - 12 %   Monocytes Absolute 0.7  0.1 - 1.0 K/uL   Eosinophils Relative 6 (*) 0 - 5 %   Eosinophils Absolute 0.6  0.0 - 0.7 K/uL   Basophils Relative 1  0 - 1 %   Basophils Absolute 0.1  0.0 - 0.1 K/uL  COMPREHENSIVE METABOLIC PANEL      Result Value Range   Sodium 141  135 - 145 mEq/L   Potassium 3.6  3.5 - 5.1 mEq/L   Chloride 105  96 - 112 mEq/L   CO2 25  19 - 32 mEq/L   Glucose, Bld 108 (*) 70 - 99 mg/dL   BUN 14  6 - 23 mg/dL   Creatinine, Ser 7.82  0.50 - 1.10 mg/dL   Calcium 9.1  8.4 - 95.6 mg/dL   Total Protein 6.8  6.0 - 8.3 g/dL   Albumin 3.8  3.5 - 5.2 g/dL   AST 35  0 - 37 U/L   ALT 43 (*) 0 - 35 U/L   Alkaline Phosphatase 57  39 - 117 U/L   Total Bilirubin 0.4  0.3 - 1.2 mg/dL   GFR calc non Af Amer 88 (*) >90 mL/min   GFR calc Af Amer >90  >90 mL/min  PROTIME-INR      Result Value Range   Prothrombin Time 11.9  11.6 - 15.2 seconds   INR 0.89  0.00 - 1.49   MDM   1. Acute anterior epistaxis    Anterior epistaxis. Bleeding is controlled with placement of Rapid Rhino. This will need to be removed in 2 days. Patient is advised to stop her aspirin since she is not sure and any recurrence of venous thromboembolic disease in 40 years.  Dione Booze, MD 04/14/13 [phone]

## 2013-04-16 DIAGNOSIS — J3489 Other specified disorders of nose and nasal sinuses: Secondary | ICD-10-CM | POA: Diagnosis not present

## 2013-04-16 DIAGNOSIS — R040 Epistaxis: Secondary | ICD-10-CM | POA: Diagnosis not present

## 2013-04-25 DIAGNOSIS — R040 Epistaxis: Secondary | ICD-10-CM | POA: Diagnosis not present

## 2013-04-26 ENCOUNTER — Ambulatory Visit (INDEPENDENT_AMBULATORY_CARE_PROVIDER_SITE_OTHER): Payer: Medicare Other | Admitting: Surgery

## 2013-04-26 ENCOUNTER — Encounter (INDEPENDENT_AMBULATORY_CARE_PROVIDER_SITE_OTHER): Payer: Self-pay | Admitting: Surgery

## 2013-04-26 VITALS — BP 120/70 | HR 72 | Resp 16 | Ht 60.0 in | Wt 169.6 lb

## 2013-04-26 DIAGNOSIS — R109 Unspecified abdominal pain: Secondary | ICD-10-CM

## 2013-04-26 DIAGNOSIS — R040 Epistaxis: Secondary | ICD-10-CM | POA: Insufficient documentation

## 2013-04-26 DIAGNOSIS — G8929 Other chronic pain: Secondary | ICD-10-CM

## 2013-04-26 DIAGNOSIS — K811 Chronic cholecystitis: Secondary | ICD-10-CM

## 2013-04-26 DIAGNOSIS — K7689 Other specified diseases of liver: Secondary | ICD-10-CM

## 2013-04-26 DIAGNOSIS — K7581 Nonalcoholic steatohepatitis (NASH): Secondary | ICD-10-CM

## 2013-04-26 NOTE — Progress Notes (Signed)
Subjective:     Patient ID: Katelyn Taylor, female   DOB: 1940-07-13, 72 y.o.   MRN: [id number]  Abdominal Pain Associated symptoms: no chest pain, no chills, no constipation, no cough, no diarrhea, no dysuria, no fever, no nausea, no sore throat and no vomiting     Katelyn Taylor  03-15-1941 [id number]  Patient Care Team: [name] as PCP - General (Physician Assistant) [name], MD as Consulting Physician (Gastroenterology) [name], MD as Consulting Physician (Urology) Christia Reading, MD as Consulting Physician (Otolaryngology)  Procedure (Date: 03/16/2013):  Procedure: Laparoscopic cholecystectomy, Cholangiogram,  14-gauge core needle biopsy of liver  Diagnosis 1. Gallbladder - CHRONIC CHOLECYSTITIS. - NEGATIVE FOR CHOLELITHIASIS. - CHOLESTEROLOSIS. 2. Liver, needle/core biopsy - BENIGN LIVER WITH STEATOHEPATITIS. - TRICHROME STAIN DEMONSTRATES MINIMAL PORTAL FIBROSIS. - IRON STAIN IS NEGATIVE FOR HEMOSIDERIN DEPOSITION. [place] RUND DO Pathologist, Electronic Signature (Case signed 03/19/2013  This patient returns for surgical re-evaluation.  Her abdomen was much better.  She is eating well.  Denies any nausea or vomiting.  Bowels are not normal but getting there.  Appetite improving a little.  Energy level has been hampered by recent severe nosebleed.  She required emergency room and ENT visits, but recently had her nasal cavities ablated and that has controlled things.  Still continues on the omega 3 fatty acids and aspirin.  Patient Active Problem List   Diagnosis Date Noted  . Epistaxis s/p cautery/Tx 04/26/2013  . Chronic cholecystitis s/p lap chole 03/16/2013 03/27/2013  . Nonalcoholic steatohepatitis (NASH) 01/10/2013  . Nausea and bloating 01/10/2013  . Chronic abdominal pain - epigastric & RUQ 01/10/2013  . ANXIETY 02/03/2009  . DEPRESSION 01/28/2009  . DVT 01/28/2009  . EXTERNAL HEMORRHOIDS 01/28/2009  . GERD 01/28/2009  . GASTRITIS, CHRONIC 01/28/2009    . HIATAL HERNIA 01/28/2009  . HEADACHE, CHRONIC 01/28/2009    Past Medical History  Diagnosis Date  . Severe dysplasia of cervix (CIN III)   . DVT (deep venous thrombosis)   . Anticoagulated   . Osteopenia   . Fatty liver   . GERD (gastroesophageal reflux disease)   . External hemorrhoid   . Hiatal hernia   . Chronic gastritis   . Depression   . Arthritis   . Hypertension     Past Surgical History  Procedure Laterality Date  . Knee arthroscopy Right   . Vaginal hysterectomy      Partial  . Tmj arthroplasty    . Tubal ligation    . Laparoscopic cholecystectomy  03/16/2013  . Diagnostic laparoscopic liver biopsy  03/16/2013    History   Social History  . Marital Status: Married    Spouse Name: N/A    Number of Children: 3  . Years of Education: N/A   Occupational History  . Housewife   . caregiver    Social History Main Topics  . Smoking status: Former Smoker    Types: Cigarettes  . Smokeless tobacco: Never Used  . Alcohol Use: No  . Drug Use: No  . Sexual Activity: No   Other Topics Concern  . Not on file   Social History Narrative  . No narrative on file    Family History  Problem Relation Age of Onset  . Melanoma Mother   . Stroke Mother   . Heart disease Mother   . Cancer Mother     melonoma  . Heart disease Father   . Breast cancer Sister 15  . Cancer Sister  breast  . Lung cancer Brother   . Cancer Brother     throat, lung  . Breast cancer Maternal Aunt     Age unknown  . Heart disease Sister   . Throat cancer Brother   . Ovarian cancer      aunt  . Colon cancer Neg Hx     Current Outpatient Prescriptions  Medication Sig Dispense Refill  . aspirin 81 MG chewable tablet Chew 81 mg by mouth daily.       . B Complex Vitamins (VITAMIN B COMPLEX PO) Take 3 capsules by mouth daily.      . Cholecalciferol (VITAMIN D PO) Take 1 tablet by mouth daily.       . clindamycin (CLINDAGEL) 1 % gel Apply 1 application topically 2 (two) times  daily as needed (facial rash).       . diazepam (VALIUM) 10 MG tablet Take 1 tablet (10 mg total) by mouth as needed.  30 tablet  3  . fish oil-omega-3 fatty acids 1000 MG capsule Take 1 g by mouth daily.       . furosemide (LASIX) 40 MG tablet Take 60 mg by mouth daily.       Marland Kitchen ketoconazole (NIZORAL) 2 % cream Apply 1 application topically daily.       . lansoprazole (PREVACID) 30 MG capsule Take 30 mg by mouth daily.       Marland Kitchen lisinopril (PRINIVIL,ZESTRIL) 5 MG tablet Take 5 mg by mouth daily.      . metroNIDAZOLE (METROGEL) 0.75 % gel Apply 1 application topically 2 (two) times daily.       Marland Kitchen VITAMIN E PO Take 1 tablet by mouth daily.        No current facility-administered medications for this visit.     Allergies  Allergen Reactions  . Amoxicillin Other (See Comments)    Yeast infection  . Cephalexin Other (See Comments)    headache  . Codeine Nausea And Vomiting  . Nsaids Other (See Comments)    Headache, can take aspirin  . Penicillins Other (See Comments)    Yeast infections  . Sulfonamide Derivatives Other (See Comments)    headache    BP 120/70  Pulse 72  Resp 16  Ht 5' (1.524 m)  Wt 169 lb 9.6 oz (76.93 kg)  BMI 33.12 kg/m2  US Abdomen Complete  03/27/2013   *RADIOLOGY REPORT*  Clinical Data:  Right upper quadrant abdominal pain, evaluate for fluid collection following cholecystectomy, history of fatty liver and hypertension  COMPLETE ABDOMINAL ULTRASOUND  Comparison: Abdominal ultrasound - 06/12/2010  Findings:  Gallbladder:  Surgically absent.  There are no discrete fluid collections within the gallbladder fossa.  Common bile duct:  Normal in size measuring 4.9 mm in diameter  Liver:  There is diffuse increased echogenicity hepatic parenchyma suggestive of hepatic steatosis.  No discrete hepatic lesions.  No intrahepatic biliary duct dilatation.  No ascites.  IVC:  Appears normal.  Pancreas:  Limited visualization of the pancreatic head and neck is normal.   Visualization of the pancreatic body and tail is obscured by bowel gas.  Spleen:  Normal in size measuring 8.1 cm in length  Right Kidney:  Normal cortical thickness, echogenicity and size, measuring 10.9 cm in length.  No focal renal lesions.  No echogenic renal stones.  No urinary obstruction.  Left Kidney:  Normal cortical thickness, echogenicity and size, measuring 11.6 cm in length.  No focal renal lesions.  No echogenic renal stones.  No urinary obstruction.  Abdominal aorta:  No aneurysm identified.  IMPRESSION: 1.  Post cholecystectomy.  There is no discrete fluid collection within the gallbladder fossa to suggest a postoperative abscess. Further evaluation may be performed with abdominal CT as clinically indicated. 2.  Hepatic steatosis.   Original Report Authenticated By: Tacey Ruiz, MD     Review of Systems  Constitutional: Negative for fever, chills and diaphoresis.  HENT: Negative for ear pain, sore throat and trouble swallowing.   Eyes: Negative for photophobia and visual disturbance.  Respiratory: Negative for cough and choking.   Cardiovascular: Negative for chest pain and palpitations.  Gastrointestinal: Positive for abdominal pain. Negative for nausea, vomiting, diarrhea, constipation, anal bleeding and rectal pain.  Genitourinary: Negative for dysuria, frequency and difficulty urinating.  Musculoskeletal: Negative for gait problem and myalgias.  Skin: Negative for color change, pallor and rash.  Neurological: Negative for dizziness, speech difficulty, weakness and numbness.  Hematological: Negative for adenopathy.  Psychiatric/Behavioral: Negative for confusion and agitation. The patient is not nervous/anxious.        Objective:   Physical Exam  Constitutional: She is oriented to person, place, and time. She appears well-developed and well-nourished. No distress.  HENT:  Head: Normocephalic.  Mouth/Throat: Oropharynx is clear and moist. No oropharyngeal exudate.  Eyes:  Conjunctivae and EOM are normal. Pupils are equal, round, and reactive to light. No scleral icterus.  Neck: Normal range of motion. No tracheal deviation present.  Cardiovascular: Normal rate and intact distal pulses.   Pulmonary/Chest: Effort normal. No respiratory distress. She exhibits no tenderness.  Abdominal: Soft. She exhibits no distension. There is no tenderness. There is no rigidity, no rebound, no guarding, no CVA tenderness, no tenderness at McBurney's point and negative Murphy's sign. No hernia. Hernia confirmed negative in the ventral area, confirmed negative in the right inguinal area and confirmed negative in the left inguinal area.    Incisions clean with normal healing ridges.  No hernias  Genitourinary: No vaginal discharge found.  Musculoskeletal: Normal range of motion. She exhibits no tenderness.  Lymphadenopathy:       Right: No inguinal adenopathy present.       Left: No inguinal adenopathy present.  Neurological: She is alert and oriented to person, place, and time. No cranial nerve deficit. She exhibits normal muscle tone. Coordination normal.  Skin: Skin is warm and dry. No rash noted. She is not diaphoretic.  Psychiatric: She has a normal mood and affect. She is not slowed, not withdrawn and not actively hallucinating. Thought content is not paranoid. She expresses no suicidal ideation.  Still tends to ramble and have mild interruptions/tangental thoughts.  Not anxious or delirious.  Consolable.  Smiling more today.       Assessment:     Doing better 5 weeks out from cholecystectomy and liver biopsy and anxious patient     Plan:     I think she must be doing okay from an abdominal standpoint, because all she wanted to talk about was the nosebleed.  She had numerous complaints about the process of controlling the bleeding, but when I reminded her that the purpose was to keep her from bleeding to death, she was glad she was not having the bleeding anymore.  I  recommended that  she continue to follow up with ENT to make sure that heals.  I recommended she discuss with her primary care physician about minimizing medications that could cause further bleeding.  I again reviewed the pathology with  her.  She was relieved to know that the findings were benign.  No cirrhosis.  No cancer.  I stressed to her using a low fat high fiber diet and exercise regularly.  Try to keep weight down.  She does have steatohepatitis/fatty change of the liver.  Try to keep alcohol use minimal.  Consider reviewing medication list with primary care physician to make sure that is not causing any hepatic strain/hepatitis.  Increase activity as tolerated to regular activity.  Low impact exercise such as walking an hour a day at least ideal.  Do not push through pain.  I strongly recommend she control her pain and take our recommendations.  Diet as tolerated.  Low fat high fiber diet ideal.  Bowel regimen with 30 g fiber a day and fiber supplement as needed to avoid problems.  I strongly recommend she get back on a fiber bowel regimen.  Return to clinic as needed.  I think she mainly needed reassurance.  Instructions discussed & printed again. Followup with primary care physician for other health issues as would normally be done.  Questions answered.  The patient expressed understanding and appreciation

## 2013-04-26 NOTE — Patient Instructions (Addendum)
LAPAROSCOPIC SURGERY: POST OP INSTRUCTIONS  1. DIET: Follow a light bland diet the first 24 hours after arrival home, such as soup, liquids, crackers, etc.  Be sure to include lots of fluids daily.  Avoid fast food or heavy meals as your are more likely to get nauseated.  Eat a low fat the next few days after surgery.   2. Take your usually prescribed home medications unless otherwise directed. 3. PAIN CONTROL: a. Pain is best controlled by a usual combination of three different methods TOGETHER: i. Ice/Heat ii. Over the counter pain medication iii. Prescription pain medication b. Most patients will experience some swelling and bruising around the incisions.  Ice packs or heating pads (30-60 minutes up to 6 times a day) will help. Use ice for the first few days to help decrease swelling and bruising, then switch to heat to help relax tight/sore spots and speed recovery.  Some people prefer to use ice alone, heat alone, alternating between ice & heat.  Experiment to what works for you.  Swelling and bruising can take several weeks to resolve.   c. It is helpful to take an over-the-counter pain medication regularly for the first few weeks.  Choose one of the following that works best for you: i. Naproxen (Aleve, etc)  Two 236m tabs twice a day ii. Ibuprofen (Advil, etc) Three 2060mtabs four times a day (every meal & bedtime) iii. Acetaminophen (Tylenol, etc) 500-65025mour times a day (every meal & bedtime) d. A  prescription for pain medication (such as oxycodone, hydrocodone, etc) should be given to you upon discharge.  Take your pain medication as prescribed.  i. If you are having problems/concerns with the prescription medicine (does not control pain, nausea, vomiting, rash, itching, etc), please call us [phone] see if we need to switch you to a different pain medicine that will work better for you and/or control your side effect better. ii. If you need a refill on your pain medication,  please contact your pharmacy.  They will contact our office to request authorization. Prescriptions will not be filled after 5 pm or on week-ends. 4. Avoid getting constipated.  Between the surgery and the pain medications, it is common to experience some constipation.  Increasing fluid intake and taking a fiber supplement (such as Metamucil, Citrucel, FiberCon, MiraLax, etc) 1-2 times a day regularly will usually help prevent this problem from occurring.  A mild laxative (prune juice, Milk of Magnesia, MiraLax, etc) should be taken according to package directions if there are no bowel movements after 48 hours.   5. Watch out for diarrhea.  If you have many loose bowel movements, simplify your diet to bland foods & liquids for a few days.  Stop any stool softeners and decrease your fiber supplement.  Switching to mild anti-diarrheal medications (Kayopectate, Pepto Bismol) can help.  If this worsens or does not improve, please call us.[place]. Wash / shower every day.  You may shower over the dressings as they are waterproof.  Continue to shower over incision(s) after the dressing is off. 7. Remove your waterproof bandages 5 days after surgery.  You may leave the incision open to air.  You may replace a dressing/Band-Aid to cover the incision for comfort if you wish.  8. ACTIVITIES as tolerated:   a. You may resume regular (light) daily activities beginning the next day-such as daily self-care, walking, climbing stairs-gradually increasing activities as tolerated.  If you can walk 30 minutes without difficulty, it  is safe to try more intense activity such as jogging, treadmill, bicycling, low-impact aerobics, swimming, etc. b. Save the most intensive and strenuous activity for last such as sit-ups, heavy lifting, contact sports, etc  Refrain from any heavy lifting or straining until you are off narcotics for pain control.   c. DO NOT PUSH THROUGH PAIN.  Let pain be your guide: If it hurts to do something, don't do  it.  Pain is your body warning you to avoid that activity for another week until the pain goes down. d. You may drive when you are no longer taking prescription pain medication, you can comfortably wear a seatbelt, and you can safely maneuver your car and apply brakes. e. Dennis Bast may have sexual intercourse when it is comfortable.  9. FOLLOW UP in our office a. Please call [facility] at [phone] to set up an appointment to see your surgeon in the office for a follow-up appointment approximately 2-3 weeks after your surgery. b. Make sure that you call for this appointment the day you arrive home to insure a convenient appointment time. 10. IF YOU HAVE DISABILITY OR FAMILY LEAVE FORMS, BRING THEM TO THE OFFICE FOR PROCESSING.  DO NOT GIVE THEM TO YOUR DOCTOR.   WHEN TO CALL us [phone]: 1. Poor pain control 2. Reactions / problems with new medications (rash/itching, nausea, etc)  3. Fever over 101.5 F (38.5 C) 4. Inability to urinate 5. Nausea and/or vomiting 6. Worsening swelling or bruising 7. Continued bleeding from incision. 8. Increased pain, redness, or drainage from the incision   The clinic staff is available to answer your questions during regular business hours (8:30am-5pm).  Please don't hesitate to call and ask to speak to one of our nurses for clinical concerns.   If you have a medical emergency, go to the nearest emergency room or call 911.  A surgeon from [facility] is always on call at the [facility], [place], [place], [place], [place]  [address] ? MAIN: [phone] ? TOLL FREE: [phone] ?  FAX [phone] [email or web address]  Fatty Liver Fatty liver is the accumulation of fat in liver cells. It is also called hepatosteatosis or steatohepatitis. It is normal for your liver to contain some fat. If fat is more than 5 to 10% of your liver's weight, you have fatty liver.  There are often no  symptoms (problems) for years while damage is still occurring. People often learn about their fatty liver when they have medical tests for other reasons. Fat can damage your liver for years or even decades without causing problems. When it becomes severe, it can cause fatigue, weight loss, weakness, and confusion. This makes you more likely to develop more serious liver problems. The liver is the largest organ in the body. It does a lot of work and often gives no warning signs when it is sick until late in a disease. The liver has many important jobs including:  Breaking down foods.  Storing vitamins, iron, and other minerals.  Making proteins.  Making bile for food digestion.  Breaking down many products including medications, alcohol and some poisons. CAUSES  There are a number of different conditions, medications, and poisons that can cause a fatty liver. Eating too many calories causes fat to build up in the liver. Not processing and breaking fats down normally may also cause this. Certain conditions, such as obesity, diabetes, and high triglycerides also cause this. Most fatty  liver patients tend to be middle-aged and over weight.  Some causes of fatty liver are:  Alcohol over consumption.  Malnutrition.  Steroid use.  Valproic acid toxicity.  Obesity.  Cushing's syndrome.  Poisons.  Tetracycline in high dosages.  Pregnancy.  Diabetes.  Hyperlipidemia.  Rapid weight loss. Some people develop fatty liver even having none of these conditions. SYMPTOMS  Fatty liver most often causes no problems. This is called asymptomatic.  It can be diagnosed with blood tests and also by a liver biopsy.  It is one of the most common causes of minor elevations of liver enzymes on routine blood tests.  Specialized Imaging of the liver using ultrasound, CT (computed tomography) scan, or MRI (magnetic resonance imaging) can suggest a fatty liver but a biopsy is needed to confirm  it.  A biopsy involves taking a small sample of liver tissue. This is done by using a needle. It is then looked at under a microscope by a specialist. TREATMENT  It is important to treat the cause. Simple fatty liver without a medical reason may not need treatment.  Weight loss, fat restriction, and exercise in overweight patients produces inconsistent results but is worth trying.  Fatty liver due to alcohol toxicity may not improve even with stopping drinking.  Good control of diabetes may reduce fatty liver.  Lower your triglycerides through diet, medication or both.  Eat a balanced, healthy diet.  Increase your physical activity.  Get regular checkups from a liver specialist.  There are no medical or surgical treatments for a fatty liver or NASH, but improving your diet and increasing your exercise may help prevent or reverse some of the damage. PROGNOSIS  Fatty liver may cause no damage or it can lead to an inflammation of the liver. This is, called steatohepatitis. When it is linked to alcohol abuse, it is called alcoholic steatohepatitis. It often is not linked to alcohol. It is then called nonalcoholic steatohepatitis, or NASH. Over time the liver may become scarred and hardened. This condition is called cirrhosis. Cirrhosis is serious and may lead to liver failure or cancer. NASH is one of the leading causes of cirrhosis. About 10-20% of Americans have fatty liver and a smaller 2-5% has NASH. Document Released: 07/16/2005 Document Revised: 08/23/2011 Document Reviewed: 09/08/2005 [facility] Patient Information 2014 [place], [place].  Exercise to Lose Weight Exercise and a healthy diet may help you lose weight. Your doctor may suggest specific exercises. EXERCISE IDEAS AND TIPS  Choose low-cost things you enjoy doing, such as walking, bicycling, or exercising to workout videos.  Take stairs instead of the elevator.  Walk during your lunch break.  Park your car further away  from work or school.  Go to a gym or an exercise class.  Start with 5 to 10 minutes of exercise each day. Build up to 30 minutes of exercise 4 to 6 days a week.  Wear shoes with good support and comfortable clothes.  Stretch before and after working out.  Work out until you breathe harder and your heart beats faster.  Drink extra water when you exercise.  Do not do so much that you hurt yourself, feel dizzy, or get very short of breath. Exercises that burn about 150 calories:  Running 1  miles in 15 minutes.  Playing volleyball for 45 to 60 minutes.  Washing and waxing a car for 45 to 60 minutes.  Playing touch football for 45 minutes.  Walking 1  miles in 35 minutes.  Pushing a  stroller 1  miles in 30 minutes.  Playing basketball for 30 minutes.  Raking leaves for 30 minutes.  Bicycling 5 miles in 30 minutes.  Walking 2 miles in 30 minutes.  Dancing for 30 minutes.  Shoveling snow for 15 minutes.  Swimming laps for 20 minutes.  Walking up stairs for 15 minutes.  Bicycling 4 miles in 15 minutes.  Gardening for 30 to 45 minutes.  Jumping rope for 15 minutes.  Washing windows or floors for 45 to 60 minutes. Document Released: 07/03/2010 Document Revised: 08/23/2011 Document Reviewed: 07/03/2010 [facility] Patient Information 2014 [place], [place].   Fish Oil, Omega-3 Fatty Acids capsules (OTC) What is this medicine? FISH OIL, OMEGA-3 FATTY ACIDS (Fish Oil, oh MAY ga - 3 fatty AS ids) are essential fats. It is promoted to help support a healthy heart. This dietary supplement is used to add to a healthy diet. The FDA has not approved this supplement for any medical use. This supplement may be used for other purposes; ask your health care provider or pharmacist if you have questions. This medicine may be used for other purposes; ask your health care provider or pharmacist if you have questions. COMMON BRAND NAME(S): [employer] Omega-3 1450, [address] Omega, [address] Professional Omega-3 2100, Omega-3 [place] , Omega-3, Sea-Omega, TherOmega  What should I tell my health care provider before I take this medicine? They need to know if you have any of these conditions -bleeding problems -lung or breathing disease, like asthma -an unusual or allergic reaction to fish oil, omega-3 fatty acids, fish, other medicines, foods, dyes, or preservatives -pregnant or trying to get pregnant -breast-feeding How should I use this medicine? Take this medicine by mouth with a glass of water. Follow the directions on the package or prescription label. Take with food. Take your medicine at regular intervals. Do not take your medicine more often than directed. Talk to your pediatrician regarding the use of this medicine in children. Special care may be needed. This medicine should not be used in children without a doctor's advice. Overdosage: If you think you have taken too much of this medicine contact a poison control center or emergency room at once. NOTE: This medicine is only for you. Do not share this medicine with others. What if I miss a dose? If you miss a dose, take it as soon as you can. If it is almost time for your next dose, take only that dose. Do not take double or extra doses. What may interact with this medicine? -aspirin and aspirin-like medicines -herbal products like danshen, dong quai, garlic pills, ginger, ginkgo biloba, horse chestnut, willow bark, and others -medicines that treat or prevent blood clots like enoxaparin, heparin, warfarin This list may not describe all possible interactions. Give your health care provider a list of all the medicines, herbs, non-prescription drugs, or dietary supplements you use. Also tell them if you smoke, drink alcohol, or use illegal drugs. Some items may interact with your medicine. What should I watch for while using this medicine? Follow a good diet and exercise plan. Taking a dietary  supplement does not replace a healthy lifestyle. Some foods that have omega-3 fatty acids naturally are fatty fish like albacore tuna, halibut, herring, mackerel, lake trout, salmon, and sardines. Too much of this supplement can be unsafe. Talk to your doctor or health care provider about how much of this supplement is right for you. If you are scheduled for any medical or dental procedure, tell  your healthcare provider that you are taking this medicine. You may need to stop taking this medicine before the procedure. Herbal or dietary supplements are not regulated like medicines. Rigid quality control standards are not required for dietary supplements. The purity and strength of these products can vary. The safety and effect of this dietary supplement for a certain disease or illness is not well known. This product is not intended to diagnose, treat, cure or prevent any disease. The Food and Drug Administration suggests the following to help consumers protect themselves: -Always read product labels and follow directions. -Natural does not mean a product is safe for humans to take. -Look for products that include USP after the ingredient name. This means that the manufacturer followed the standards of the [place] Pharmacopoeia. -Supplements made or sold by a nationally known food or drug company are more likely to be made under tight controls. You can write to the company for more information about how the product was made. What side effects may I notice from receiving this medicine? Side effects that you should report to your doctor or health care professional as soon as possible: -allergic reactions like skin rash, itching or hives, swelling of the face, lips, or tongue -breathing problems -changes in your moods or emotions -unusual bleeding or bruising Side effects that usually do not require medical attention (report to your doctor or health care professional if they continue or are bothersome): -bad  or fishy breath -belching -diarrhea -nausea -stomach gas, upset -weight gain This list may not describe all possible side effects. Call your doctor for medical advice about side effects. You may report side effects to FDA at 1-800-FDA-1088. Where should I keep my medicine? Keep out of the reach of children. Store at room temperature or as directed on the package label. Protect from moisture. Do not freeze. Throw away any unused medicine after the expiration date. NOTE: This sheet is a summary. It may not cover all possible information. If you have questions about this medicine, talk to your doctor, pharmacist, or health care provider.  2014, Elsevier/Gold Standard. (2007-08-17 13:05:24)  Nosebleed Nosebleeds can be caused by many conditions including trauma, infections, polyps, foreign bodies, dry mucous membranes or climate, medications and air conditioning. Most nosebleeds occur in the front of the nose. It is because of this location that most nosebleeds can be controlled by pinching the nostrils gently and continuously. Do this for at least 10 to 20 minutes. The reason for this long continuous pressure is that you must hold it long enough for the blood to clot. If during that 10 to 20 minute time period, pressure is released, the process may have to be started again. The nosebleed may stop by itself, quit with pressure, need concentrated heating (cautery) or stop with pressure from packing. HOME CARE INSTRUCTIONS   If your nose was packed, try to maintain the pack inside until your caregiver removes it. If a gauze pack was used and it starts to fall out, gently replace or cut the end off. Do not cut if a balloon catheter was used to pack the nose. Otherwise, do not remove unless instructed.  Avoid blowing your nose for 12 hours after treatment. This could dislodge the pack or clot and start bleeding again.  If the bleeding starts again, sit up and bending forward, gently pinch the front  half of your nose continuously for 20 minutes.  If bleeding was caused by dry mucous membranes, cover the inside of your nose every  morning with a petroleum or antibiotic ointment. Use your little fingertip as an applicator. Do this as needed during dry weather. This will keep the mucous membranes moist and allow them to heal.  Maintain humidity in your home by using less air conditioning or using a humidifier.  Do not use aspirin or medications which make bleeding more likely. Your caregiver can give you recommendations on this.  Resume normal activities as able but try to avoid straining, lifting or bending at the waist for several days.  If the nosebleeds become recurrent and the cause is unknown, your caregiver may suggest laboratory tests. SEEK IMMEDIATE MEDICAL CARE IF:   Bleeding recurs and cannot be controlled.  There is unusual bleeding from or bruising on other parts of the body.  You have a fever.  Nosebleeds continue.  There is any worsening of the condition which originally brought you in.  You become lightheaded, feel faint, become sweaty or vomit blood. MAKE SURE YOU:   Understand these instructions.  Will watch your condition.  Will get help right away if you are not doing well or get worse. Document Released: 03/10/2005 Document Revised: 08/23/2011 Document Reviewed: 05/02/2009 [facility] Patient Information 2014 [place], [place].

## 2013-05-18 DIAGNOSIS — I10 Essential (primary) hypertension: Secondary | ICD-10-CM | POA: Diagnosis not present

## 2013-05-18 DIAGNOSIS — F411 Generalized anxiety disorder: Secondary | ICD-10-CM | POA: Diagnosis not present

## 2013-05-18 DIAGNOSIS — R51 Headache: Secondary | ICD-10-CM | POA: Diagnosis not present

## 2013-06-01 DIAGNOSIS — S4980XA Other specified injuries of shoulder and upper arm, unspecified arm, initial encounter: Secondary | ICD-10-CM | POA: Diagnosis not present

## 2013-06-01 DIAGNOSIS — J3489 Other specified disorders of nose and nasal sinuses: Secondary | ICD-10-CM | POA: Diagnosis not present

## 2013-07-10 DIAGNOSIS — M25519 Pain in unspecified shoulder: Secondary | ICD-10-CM | POA: Diagnosis not present

## 2013-07-10 DIAGNOSIS — J019 Acute sinusitis, unspecified: Secondary | ICD-10-CM | POA: Diagnosis not present

## 2013-07-20 DIAGNOSIS — M719 Bursopathy, unspecified: Secondary | ICD-10-CM | POA: Diagnosis not present

## 2013-07-20 DIAGNOSIS — M67919 Unspecified disorder of synovium and tendon, unspecified shoulder: Secondary | ICD-10-CM | POA: Diagnosis not present

## 2013-08-16 DIAGNOSIS — M25519 Pain in unspecified shoulder: Secondary | ICD-10-CM | POA: Diagnosis not present

## 2013-09-04 DIAGNOSIS — R300 Dysuria: Secondary | ICD-10-CM | POA: Diagnosis not present

## 2013-09-04 DIAGNOSIS — J019 Acute sinusitis, unspecified: Secondary | ICD-10-CM | POA: Diagnosis not present

## 2013-09-04 DIAGNOSIS — J309 Allergic rhinitis, unspecified: Secondary | ICD-10-CM | POA: Diagnosis not present

## 2013-10-23 DIAGNOSIS — R062 Wheezing: Secondary | ICD-10-CM | POA: Diagnosis not present

## 2013-10-31 DIAGNOSIS — D230 Other benign neoplasm of skin of lip: Secondary | ICD-10-CM | POA: Diagnosis not present

## 2013-10-31 DIAGNOSIS — D239 Other benign neoplasm of skin, unspecified: Secondary | ICD-10-CM | POA: Diagnosis not present

## 2013-10-31 DIAGNOSIS — L819 Disorder of pigmentation, unspecified: Secondary | ICD-10-CM | POA: Diagnosis not present

## 2013-10-31 DIAGNOSIS — L820 Inflamed seborrheic keratosis: Secondary | ICD-10-CM | POA: Diagnosis not present

## 2013-10-31 DIAGNOSIS — L821 Other seborrheic keratosis: Secondary | ICD-10-CM | POA: Diagnosis not present

## 2013-10-31 DIAGNOSIS — L905 Scar conditions and fibrosis of skin: Secondary | ICD-10-CM | POA: Diagnosis not present

## 2013-11-19 DIAGNOSIS — H02409 Unspecified ptosis of unspecified eyelid: Secondary | ICD-10-CM | POA: Diagnosis not present

## 2013-11-19 DIAGNOSIS — H52209 Unspecified astigmatism, unspecified eye: Secondary | ICD-10-CM | POA: Diagnosis not present

## 2013-11-19 DIAGNOSIS — H2510 Age-related nuclear cataract, unspecified eye: Secondary | ICD-10-CM | POA: Diagnosis not present

## 2013-12-04 DIAGNOSIS — H5710 Ocular pain, unspecified eye: Secondary | ICD-10-CM | POA: Diagnosis not present

## 2013-12-17 DIAGNOSIS — R05 Cough: Secondary | ICD-10-CM | POA: Diagnosis not present

## 2013-12-17 DIAGNOSIS — R059 Cough, unspecified: Secondary | ICD-10-CM | POA: Diagnosis not present

## 2014-02-20 DIAGNOSIS — J019 Acute sinusitis, unspecified: Secondary | ICD-10-CM | POA: Diagnosis not present

## 2014-03-28 ENCOUNTER — Encounter: Payer: Self-pay | Admitting: *Deleted

## 2014-03-29 DIAGNOSIS — Z1231 Encounter for screening mammogram for malignant neoplasm of breast: Secondary | ICD-10-CM | POA: Diagnosis not present

## 2014-03-29 DIAGNOSIS — Z803 Family history of malignant neoplasm of breast: Secondary | ICD-10-CM | POA: Diagnosis not present

## 2014-04-15 ENCOUNTER — Encounter: Payer: Self-pay | Admitting: *Deleted

## 2014-04-15 DIAGNOSIS — I10 Essential (primary) hypertension: Secondary | ICD-10-CM | POA: Diagnosis not present

## 2014-04-15 DIAGNOSIS — J3489 Other specified disorders of nose and nasal sinuses: Secondary | ICD-10-CM | POA: Diagnosis not present

## 2014-04-26 ENCOUNTER — Encounter: Payer: Self-pay | Admitting: Gynecology

## 2014-05-01 DIAGNOSIS — L814 Other melanin hyperpigmentation: Secondary | ICD-10-CM | POA: Diagnosis not present

## 2014-05-01 DIAGNOSIS — D1801 Hemangioma of skin and subcutaneous tissue: Secondary | ICD-10-CM | POA: Diagnosis not present

## 2014-05-01 DIAGNOSIS — L821 Other seborrheic keratosis: Secondary | ICD-10-CM | POA: Diagnosis not present

## 2014-05-01 DIAGNOSIS — I8391 Asymptomatic varicose veins of right lower extremity: Secondary | ICD-10-CM | POA: Diagnosis not present

## 2014-05-01 DIAGNOSIS — D225 Melanocytic nevi of trunk: Secondary | ICD-10-CM | POA: Diagnosis not present

## 2014-05-01 DIAGNOSIS — Z419 Encounter for procedure for purposes other than remedying health state, unspecified: Secondary | ICD-10-CM | POA: Diagnosis not present

## 2014-05-02 ENCOUNTER — Encounter: Payer: Self-pay | Admitting: Gynecology

## 2014-05-14 ENCOUNTER — Ambulatory Visit (INDEPENDENT_AMBULATORY_CARE_PROVIDER_SITE_OTHER): Payer: Medicare Other | Admitting: Internal Medicine

## 2014-05-14 ENCOUNTER — Encounter: Payer: Self-pay | Admitting: Internal Medicine

## 2014-05-14 ENCOUNTER — Other Ambulatory Visit (INDEPENDENT_AMBULATORY_CARE_PROVIDER_SITE_OTHER): Payer: Medicare Other

## 2014-05-14 VITALS — BP 120/76 | HR 77 | Ht 60.0 in | Wt 171.0 lb

## 2014-05-14 DIAGNOSIS — K7581 Nonalcoholic steatohepatitis (NASH): Secondary | ICD-10-CM

## 2014-05-14 DIAGNOSIS — K589 Irritable bowel syndrome without diarrhea: Secondary | ICD-10-CM | POA: Diagnosis not present

## 2014-05-14 MED ORDER — HYOSCYAMINE SULFATE 0.125 MG SL SUBL
SUBLINGUAL_TABLET | SUBLINGUAL | Status: DC
Start: 2014-05-14 — End: 2014-06-04

## 2014-05-14 MED ORDER — LANSOPRAZOLE 30 MG PO CPDR: 30.0000 mg | DELAYED_RELEASE_CAPSULE | Freq: Every day | ORAL | Status: DC

## 2014-05-14 NOTE — Progress Notes (Signed)
Katelyn Taylor [birth year] [id number]  Note: This dictation was prepared with Dragon digital system. Any transcriptional errors that result from this procedure are unintentional.   History of Present Illness: This is a 73 year old white female with history of chronic active gastritis, H. pylori negative. Last endoscopy in October 2010. She underwent laparoscopic cholecystectomy and liver biopsies By Dr Katelyn Taylor in [place] 2014  which showed steatohepatitis. Trichrome stain was positive for mild fibrosis. She has no symptoms of chronic liver disease. She is up-to-date on colonoscopy. Last exam October 2010 showed normal colon with mucosal biopsies without evidence of microscopic colitis .She is on Prevacid 30 mg daily for dyspepsia. She has occasional right lower quadrant abdominal pain and diarrhea     Past Medical History  Diagnosis Date  . Severe dysplasia of cervix (CIN III)   . DVT (deep venous thrombosis)   . Anticoagulated   . Osteopenia   . Fatty liver   . GERD (gastroesophageal reflux disease)   . External hemorrhoid   . Hiatal hernia   . Chronic gastritis   . Depression   . Arthritis   . Hypertension     Past Surgical History  Procedure Laterality Date  . Knee arthroscopy Right   . Vaginal hysterectomy      Partial  . Tmj arthroplasty    . Tubal ligation    . Laparoscopic cholecystectomy  03/16/2013  . Diagnostic laparoscopic liver biopsy  03/16/2013    Allergies  Allergen Reactions  . Amoxicillin Other (See Comments)    Yeast infection  . Cephalexin Other (See Comments)    headache  . Codeine Nausea And Vomiting  . Nsaids Other (See Comments)    Headache, can take aspirin  . Penicillins Other (See Comments)    Yeast infections  . Sulfonamide Derivatives Other (See Comments)    headache    Family history and social history have been reviewed.  Review of Systems: Negative for rectal bleeding, positive for occasional abdominal pain. Denies weight loss  The  remainder of the 10 point ROS is negative except as outlined in the H&P  Physical Exam: General Appearance Well developed, in no distress Eyes  Non icteric  HEENT  Non traumatic, normocephalic  Mouth No lesion, tongue papillated, no cheilosis Neck Supple without adenopathy, thyroid not enlarged, no carotid bruits, no JVD Lungs Clear to auscultation bilaterally COR Normal S1, normal S2, regular rhythm, no murmur, quiet precordium Abdomen Soft mildly obese. Normoactive bowel sounds. Mild tenderness right lower and middle quadrant. No palpable mass  Rectal Not done  Extremities  No pedal edema Skin No lesions Neurological Alert and oriented x 3 Psychological Normal mood and affect  Assessment and Plan:   73 year old white female with irritable bowel syndrome with predominant diarrhea. She is satisfied with her current condition. We will add Levsin sublingually 0.125 mg when necessary  Steatohepatitis by liver biopsy October 2014 during laparoscopic cholecystectomy. We will recheck liver function tests today. She will  have an upper abdominal ultrasound in July 2016 to follow up on statohepatitis .Current synthetic liver function is normal,. Colorectal screening. Last colonoscopy October 2010. Recall colonoscopy October 2020    Katelyn Taylor 05/14/2014

## 2014-05-14 NOTE — Patient Instructions (Signed)
Your physician has requested that you go to the basement for the following lab work before leaving today: LFT's  We have sent the following medications to your pharmacy for you to pick up at your convenience: [place] will be due for a liver ultrasound in 12/2014. We will call you when it gets a little closer to that time.  CC:Dr [employer]

## 2014-05-15 LAB — HEPATIC FUNCTION PANEL
ALBUMIN: 4.3 g/dL (ref 3.5–5.2)
ALT: 58 U/L — AB (ref 0–35)
AST: 64 U/L — ABNORMAL HIGH (ref 0–37)
Alkaline Phosphatase: 43 U/L (ref 39–117)
Bilirubin, Direct: 0.2 mg/dL (ref 0.0–0.3)
TOTAL PROTEIN: 6.9 g/dL (ref 6.0–8.3)
Total Bilirubin: 0.6 mg/dL (ref 0.2–1.2)

## 2014-05-16 ENCOUNTER — Telehealth: Payer: Self-pay | Admitting: *Deleted

## 2014-05-16 NOTE — Telephone Encounter (Signed)
Spoke with patient and she is waiting on PA for medication. Told her it sometimes takes a few days.

## 2014-06-04 ENCOUNTER — Telehealth: Payer: Self-pay | Admitting: Internal Medicine

## 2014-06-04 MED ORDER — DICYCLOMINE HCL 10 MG PO CAPS: 10.0000 mg | ORAL_CAPSULE | Freq: Three times a day (TID) | ORAL | Status: DC | PRN

## 2014-06-04 NOTE — Telephone Encounter (Signed)
OK to try Dicyclpmine 10 mg, #90, 1 po tid ac prn, 3 refills

## 2014-06-04 NOTE — Telephone Encounter (Signed)
Rx sent for dicyclomine in place of hyoscyamine. Patient advised.

## 2014-06-04 NOTE — Telephone Encounter (Signed)
Dr Olevia Perches- Please advise.... Would you like me to try for dicyclomine?

## 2014-06-24 ENCOUNTER — Ambulatory Visit (INDEPENDENT_AMBULATORY_CARE_PROVIDER_SITE_OTHER): Payer: Medicare Other | Admitting: Gynecology

## 2014-06-24 ENCOUNTER — Encounter: Payer: Self-pay | Admitting: Gynecology

## 2014-06-24 ENCOUNTER — Other Ambulatory Visit (HOSPITAL_COMMUNITY)
Admission: RE | Admit: 2014-06-24 | Discharge: 2014-06-24 | Disposition: A | Discharge status: Home / Self Care | Payer: Medicare Other | Source: Ambulatory Visit | Attending: Gynecology | Admitting: Gynecology

## 2014-06-24 VITALS — BP 118/78 | Ht 60.25 in | Wt 168.0 lb

## 2014-06-24 DIAGNOSIS — D069 Carcinoma in situ of cervix, unspecified: Secondary | ICD-10-CM | POA: Diagnosis not present

## 2014-06-24 DIAGNOSIS — Z01419 Encounter for gynecological examination (general) (routine) without abnormal findings: Secondary | ICD-10-CM

## 2014-06-24 DIAGNOSIS — N952 Postmenopausal atrophic vaginitis: Secondary | ICD-10-CM | POA: Diagnosis not present

## 2014-06-24 DIAGNOSIS — M8580 Other specified disorders of bone density and structure, unspecified site: Secondary | ICD-10-CM

## 2014-06-24 DIAGNOSIS — Z124 Encounter for screening for malignant neoplasm of cervix: Secondary | ICD-10-CM | POA: Insufficient documentation

## 2014-06-24 DIAGNOSIS — Z1272 Encounter for screening for malignant neoplasm of vagina: Secondary | ICD-10-CM | POA: Diagnosis not present

## 2014-06-24 DIAGNOSIS — Z87410 Personal history of cervical dysplasia: Secondary | ICD-10-CM

## 2014-06-24 NOTE — Progress Notes (Signed)
Katelyn Taylor December 07, 1940 [id number]        74 y.o.  [name] for breast and pelvic exam. Former patient of Dr. Cherylann Banas. Several issues noted below.  Past medical history,surgical history, problem list, medications, allergies, family history and social history were all reviewed and documented as reviewed in the EPIC chart.  ROS:  Performed with pertinent positives and negatives included in the history, assessment and plan.   Additional significant findings :  none   Exam: Journalist, newspaper Filed Vitals:   06/24/14 0838  BP: 118/78  Height: 5' 0.25" (1.53 m)  Weight: 168 lb (76.204 kg)   General appearance:  Normal affect, orientation and appearance. Skin: Grossly normal HEENT: Without gross lesions.  No cervical or supraclavicular adenopathy. Thyroid normal.  Lungs:  Clear without wheezing, rales or rhonchi Cardiac: RR, without RMG Abdominal:  Soft, nontender, without masses, guarding, rebound, organomegaly or hernia Breasts:  Examined lying and sitting without masses, retractions, discharge or axillary adenopathy. Pelvic:  Ext/BUS/vagina with generalized atrophic changes. Pap smear of cuff done  Adnexa  Without masses or tenderness    Anus and perineum  Normal   Rectovaginal  Normal sphincter tone without palpated masses or tenderness.    Assessment/Plan:  74 y.o. G66P3003 female for breast and pelvic exam.   1. Postmenopausal/atrophic genital changes. Status post TVH 1979 for high-grade dysplasia and dysfunctional bleeding.  Patient without significant symptoms of hot flushes, night sweats, vaginal dryness. Is not sexually active.  Continue to monitor. Report any issues. 2. History of high-grade dysplasia, CIN 3 by Dr. Valeta Harms note. Status post TVH. Pap smear of vaginal cuff today. Last Pap smear 2012. We'll continue to monitor every 3 year Pap smear. Given history of high-grade dysplasia. 3. Osteopenia. DEXA 2012 T score -1.3. FRAX 12%/0.6%. Repeat DEXA now and patient will  schedule. Increase calcium vitamin D recommendations reviewed. 4. Mammography reported 2015. Continue with annual mammography when due. SBE monthly reviewed. 5. Colonoscopy 2010. Repeat at their recommended interval. 6. Health maintenance. No routine blood work done as this is done at her primary physician's office. Follow up for bone density otherwise annually.     Anastasio Auerbach MD, 9:09 AM 06/24/2014

## 2014-06-24 NOTE — Patient Instructions (Signed)
Follow up for bone density as scheduled.  You may obtain a copy of any labs that were done today by logging onto MyChart as outlined in the instructions provided with your AVS (after visit summary). The office will not call with normal lab results but certainly if there are any significant abnormalities then we will contact you.   Health Maintenance, Female A healthy lifestyle and preventative care can promote health and wellness.  Maintain regular health, dental, and eye exams.  Eat a healthy diet. Foods like vegetables, fruits, whole grains, low-fat dairy products, and lean protein foods contain the nutrients you need without too many calories. Decrease your intake of foods high in solid fats, added sugars, and salt. Get information about a proper diet from your caregiver, if necessary.  Regular physical exercise is one of the most important things you can do for your health. Most adults should get at least 150 minutes of moderate-intensity exercise (any activity that increases your heart rate and causes you to sweat) each week. In addition, most adults need muscle-strengthening exercises on 2 or more days a week.   Maintain a healthy weight. The body mass index (BMI) is a screening tool to identify possible weight problems. It provides an estimate of body fat based on height and weight. Your caregiver can help determine your BMI, and can help you achieve or maintain a healthy weight. For adults 20 years and older:  A BMI below 18.5 is considered underweight.  A BMI of 18.5 to 24.9 is normal.  A BMI of 25 to 29.9 is considered overweight.  A BMI of 30 and above is considered obese.  Maintain normal blood lipids and cholesterol by exercising and minimizing your intake of saturated fat. Eat a balanced diet with plenty of fruits and vegetables. Blood tests for lipids and cholesterol should begin at age 20 and be repeated every 5 years. If your lipid or cholesterol levels are high, you are over  50, or you are a high risk for heart disease, you may need your cholesterol levels checked more frequently.Ongoing high lipid and cholesterol levels should be treated with medicines if diet and exercise are not effective.  If you smoke, find out from your caregiver how to quit. If you do not use tobacco, do not start.  Lung cancer screening is recommended for adults aged 55 80 years who are at high risk for developing lung cancer because of a history of smoking. Yearly low-dose computed tomography (CT) is recommended for people who have at least a 30-pack-year history of smoking and are a current smoker or have quit within the past 15 years. A pack year of smoking is smoking an average of 1 pack of cigarettes a day for 1 year (for example: 1 pack a day for 30 years or 2 packs a day for 15 years). Yearly screening should continue until the smoker has stopped smoking for at least 15 years. Yearly screening should also be stopped for people who develop a health problem that would prevent them from having lung cancer treatment.  If you are pregnant, do not drink alcohol. If you are breastfeeding, be very cautious about drinking alcohol. If you are not pregnant and choose to drink alcohol, do not exceed 1 drink per day. One drink is considered to be 12 ounces (355 mL) of beer, 5 ounces (148 mL) of wine, or 1.5 ounces (44 mL) of liquor.  Avoid use of street drugs. Do not share needles with anyone. Ask for help   if you need support or instructions about stopping the use of drugs.  High blood pressure causes heart disease and increases the risk of stroke. Blood pressure should be checked at least every 1 to 2 years. Ongoing high blood pressure should be treated with medicines, if weight loss and exercise are not effective.  If you are 55 to 73 years old, ask your caregiver if you should take aspirin to prevent strokes.  Diabetes screening involves taking a blood sample to check your fasting blood sugar level.  This should be done once every 3 years, after age 45, if you are within normal weight and without risk factors for diabetes. Testing should be considered at a younger age or be carried out more frequently if you are overweight and have at least 1 risk factor for diabetes.  Breast cancer screening is essential preventative care for women. You should practice "breast self-awareness." This means understanding the normal appearance and feel of your breasts and may include breast self-examination. Any changes detected, no matter how small, should be reported to a caregiver. Women in their 20s and 30s should have a clinical breast exam (CBE) by a caregiver as part of a regular health exam every 1 to 3 years. After age 40, women should have a CBE every year. Starting at age 40, women should consider having a mammogram (breast X-ray) every year. Women who have a family history of breast cancer should talk to their caregiver about genetic screening. Women at a high risk of breast cancer should talk to their caregiver about having an MRI and a mammogram every year.  Breast cancer gene (BRCA)-related cancer risk assessment is recommended for women who have family members with BRCA-related cancers. BRCA-related cancers include breast, ovarian, tubal, and peritoneal cancers. Having family members with these cancers may be associated with an increased risk for harmful changes (mutations) in the breast cancer genes BRCA1 and BRCA2. Results of the assessment will determine the need for genetic counseling and BRCA1 and BRCA2 testing.  The Pap test is a screening test for cervical cancer. Women should have a Pap test starting at age 21. Between ages 21 and 29, Pap tests should be repeated every 2 years. Beginning at age 30, you should have a Pap test every 3 years as long as the past 3 Pap tests have been normal. If you had a hysterectomy for a problem that was not cancer or a condition that could lead to cancer, then you no  longer need Pap tests. If you are between ages 65 and 70, and you have had normal Pap tests going back 10 years, you no longer need Pap tests. If you have had past treatment for cervical cancer or a condition that could lead to cancer, you need Pap tests and screening for cancer for at least 20 years after your treatment. If Pap tests have been discontinued, risk factors (such as a new sexual partner) need to be reassessed to determine if screening should be resumed. Some women have medical problems that increase the chance of getting cervical cancer. In these cases, your caregiver may recommend more frequent screening and Pap tests.  The human papillomavirus (HPV) test is an additional test that may be used for cervical cancer screening. The HPV test looks for the virus that can cause the cell changes on the cervix. The cells collected during the Pap test can be tested for HPV. The HPV test could be used to screen women aged 30 years and older, and   should be used in women of any age who have unclear Pap test results. After the age of 30, women should have HPV testing at the same frequency as a Pap test.  Colorectal cancer can be detected and often prevented. Most routine colorectal cancer screening begins at the age of 50 and continues through age 75. However, your caregiver may recommend screening at an earlier age if you have risk factors for colon cancer. On a yearly basis, your caregiver may provide home test kits to check for hidden blood in the stool. Use of a small camera at the end of a tube, to directly examine the colon (sigmoidoscopy or colonoscopy), can detect the earliest forms of colorectal cancer. Talk to your caregiver about this at age 50, when routine screening begins. Direct examination of the colon should be repeated every 5 to 10 years through age 75, unless early forms of pre-cancerous polyps or small growths are found.  Hepatitis C blood testing is recommended for all people born from  1945 through 1965 and any individual with known risks for hepatitis C.  Practice safe sex. Use condoms and avoid high-risk sexual practices to reduce the spread of sexually transmitted infections (STIs). Sexually active women aged 25 and younger should be checked for Chlamydia, which is a common sexually transmitted infection. Older women with new or multiple partners should also be tested for Chlamydia. Testing for other STIs is recommended if you are sexually active and at increased risk.  Osteoporosis is a disease in which the bones lose minerals and strength with aging. This can result in serious bone fractures. The risk of osteoporosis can be identified using a bone density scan. Women ages 65 and over and women at risk for fractures or osteoporosis should discuss screening with their caregivers. Ask your caregiver whether you should be taking a calcium supplement or vitamin D to reduce the rate of osteoporosis.  Menopause can be associated with physical symptoms and risks. Hormone replacement therapy is available to decrease symptoms and risks. You should talk to your caregiver about whether hormone replacement therapy is right for you.  Use sunscreen. Apply sunscreen liberally and repeatedly throughout the day. You should seek shade when your shadow is shorter than you. Protect yourself by wearing long sleeves, pants, a wide-brimmed hat, and sunglasses year round, whenever you are outdoors.  Notify your caregiver of new moles or changes in moles, especially if there is a change in shape or color. Also notify your caregiver if a mole is larger than the size of a pencil eraser.  Stay current with your immunizations. Document Released: 12/14/2010 Document Revised: 09/25/2012 Document Reviewed: 12/14/2010 [employer] Patient Information 2014 [employer].   

## 2014-06-25 LAB — URINALYSIS W MICROSCOPIC + REFLEX CULTURE
Bacteria, UA: NONE SEEN
Bilirubin Urine: NEGATIVE
CRYSTALS: NONE SEEN
Casts: NONE SEEN
GLUCOSE, UA: NEGATIVE mg/dL
Hgb urine dipstick: NEGATIVE
Ketones, ur: NEGATIVE mg/dL
Leukocytes, UA: NEGATIVE
Nitrite: NEGATIVE
PROTEIN: NEGATIVE mg/dL
Specific Gravity, Urine: 1.03 (ref 1.005–1.030)
Urobilinogen, UA: 0.2 mg/dL (ref 0.0–1.0)
pH: 5.5 (ref 5.0–8.0)

## 2014-06-25 LAB — CYTOLOGY - PAP

## 2014-06-28 DIAGNOSIS — R079 Chest pain, unspecified: Secondary | ICD-10-CM | POA: Diagnosis not present

## 2014-06-28 DIAGNOSIS — Z136 Encounter for screening for cardiovascular disorders: Secondary | ICD-10-CM | POA: Diagnosis not present

## 2014-06-28 DIAGNOSIS — F419 Anxiety disorder, unspecified: Secondary | ICD-10-CM | POA: Diagnosis not present

## 2014-06-28 DIAGNOSIS — I10 Essential (primary) hypertension: Secondary | ICD-10-CM | POA: Diagnosis not present

## 2014-06-28 IMAGING — US US ABDOMEN COMPLETE
1 series · 13 of 25 positions shown · non-contrast
Comparison: Abdominal ultrasound - 06/12/2010

CLINICAL DATA: Right upper quadrant abdominal pain, evaluate for
fluid collection following cholecystectomy, history of fatty liver
and hypertension

COMPLETE ABDOMINAL ULTRASOUND

[Series 1: us abdomen complete · 0.27mm/px · 13 of 51 slices shown]
[im 1/51]
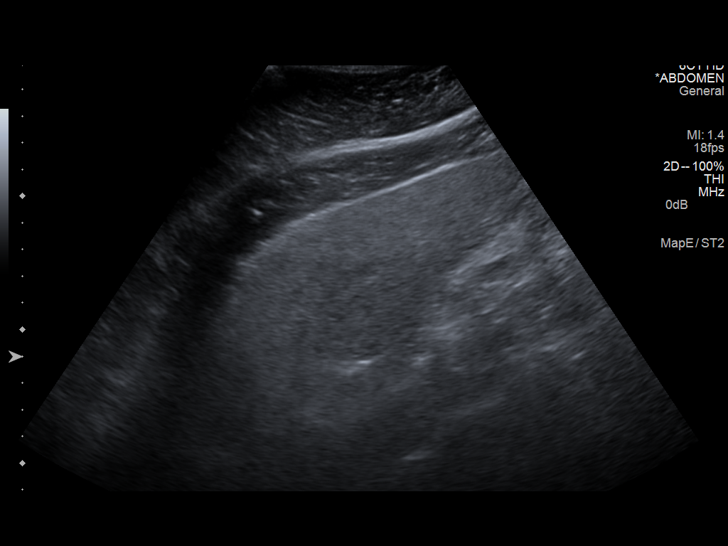
[im 5/51]
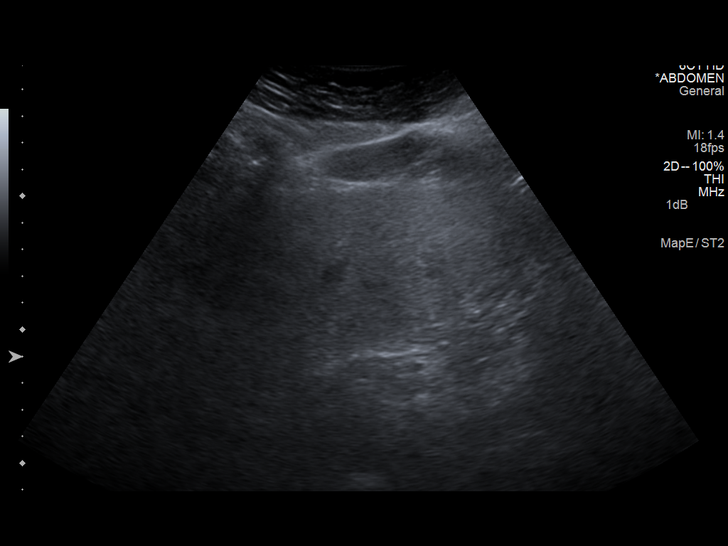
[im 9/51]
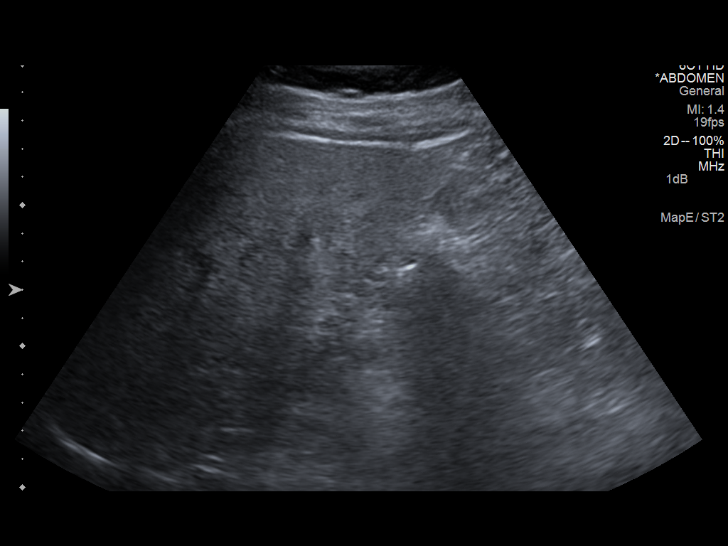
[im 13/51]
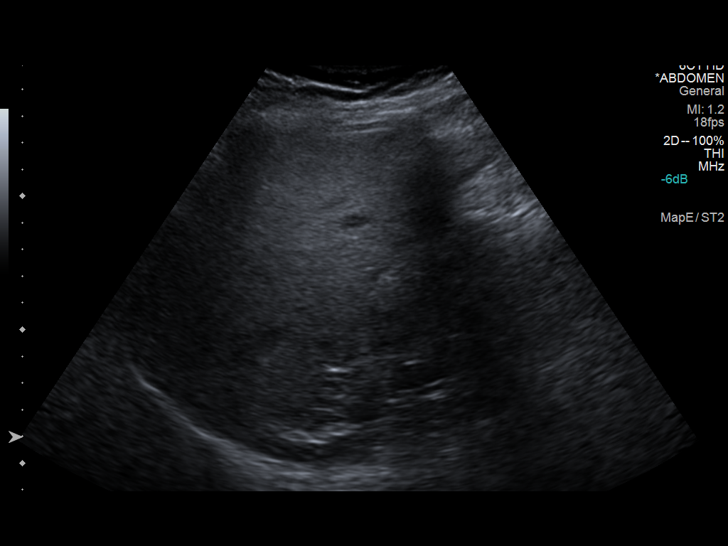
[im 17/51]
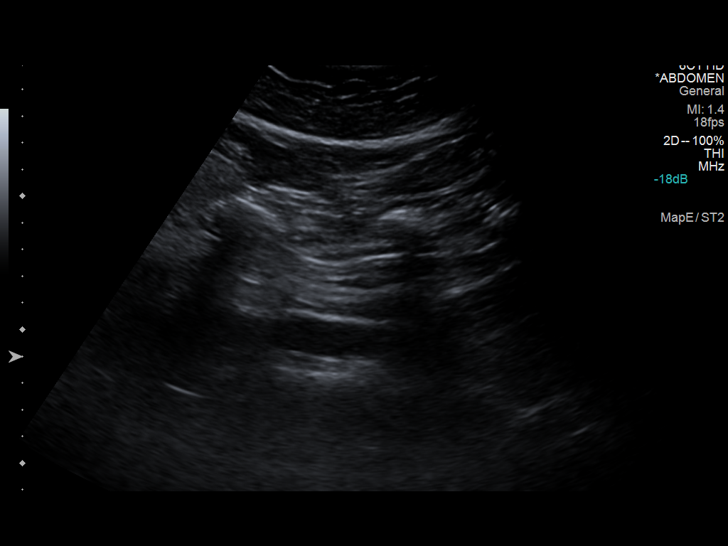
[im 21/51]
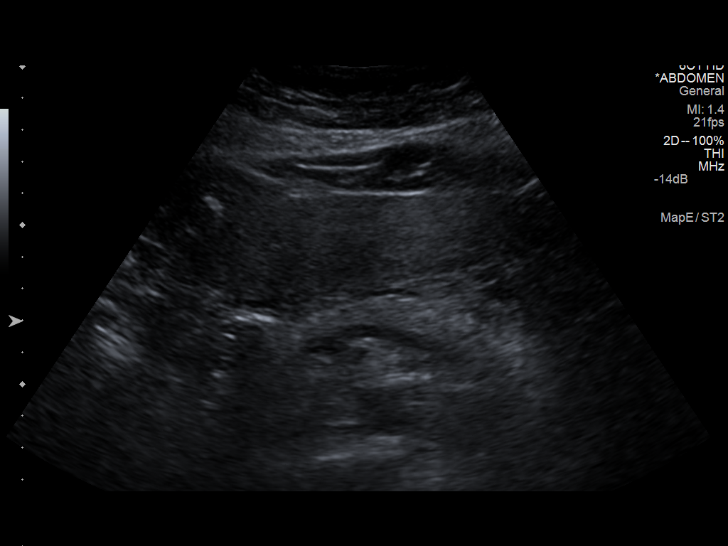
[im 26/51]
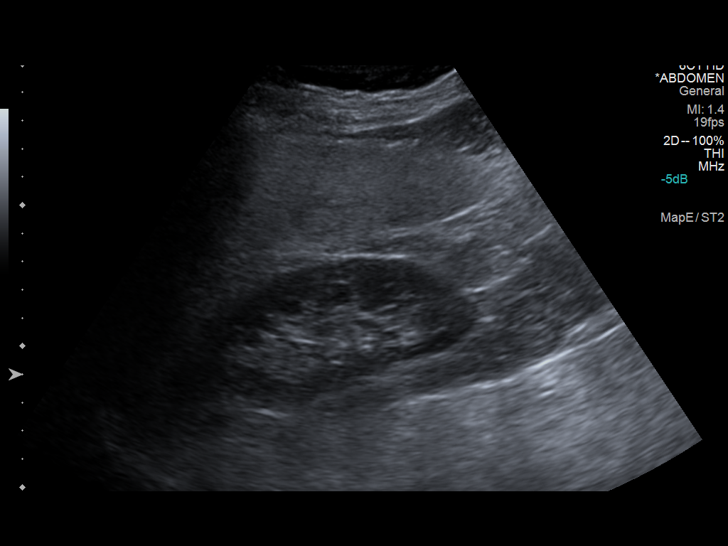
[im 30/51]
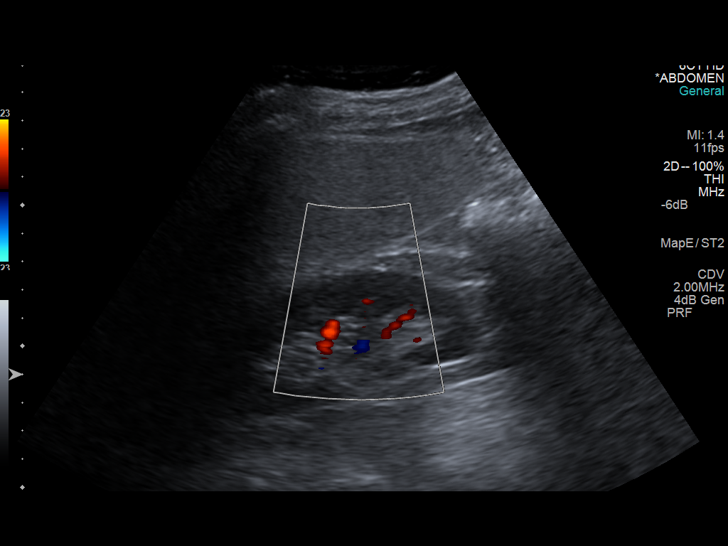
[im 34/51]
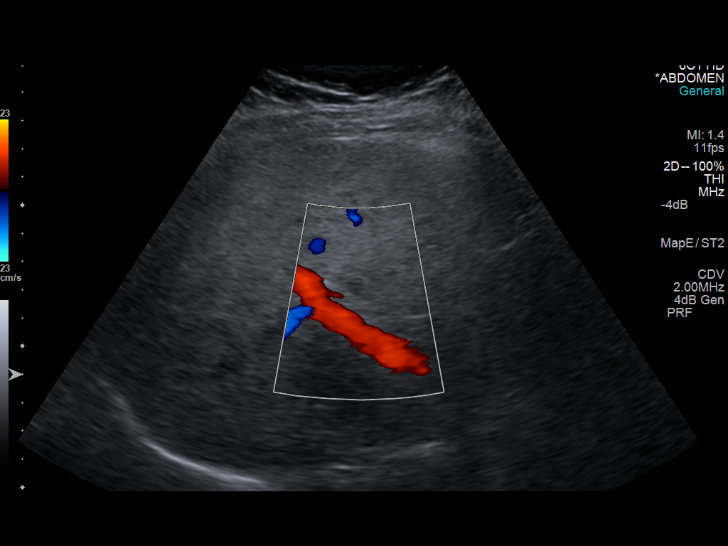
[im 38/51]
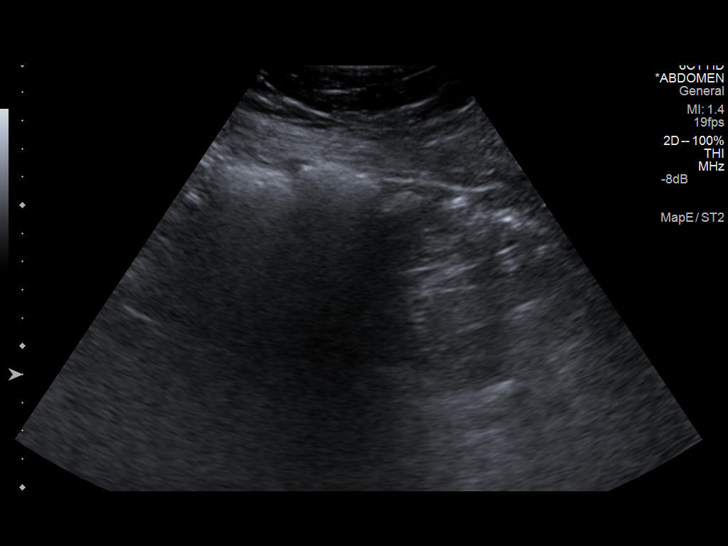
[im 42/51]
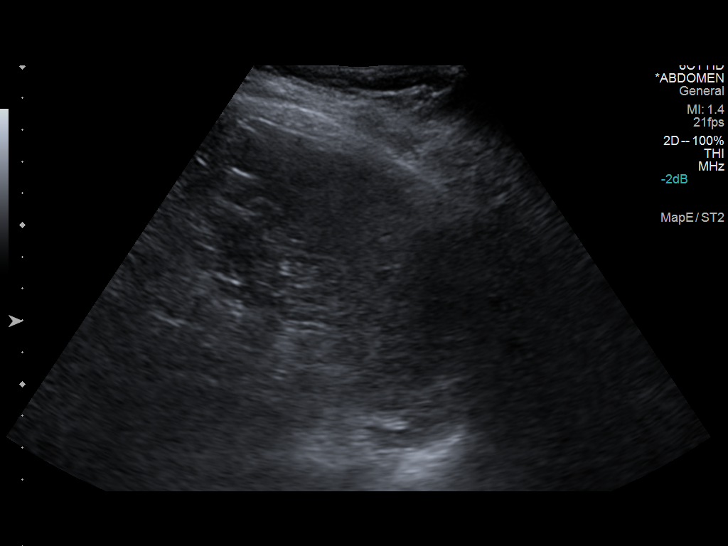
[im 46/51]
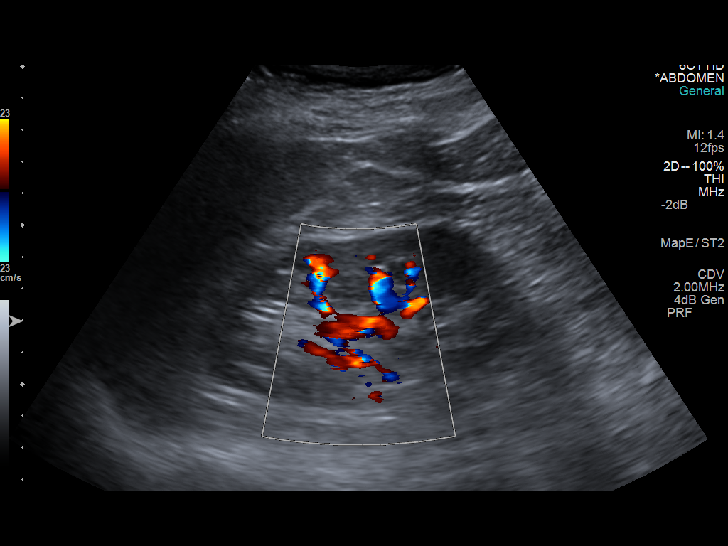
[im 51/51]
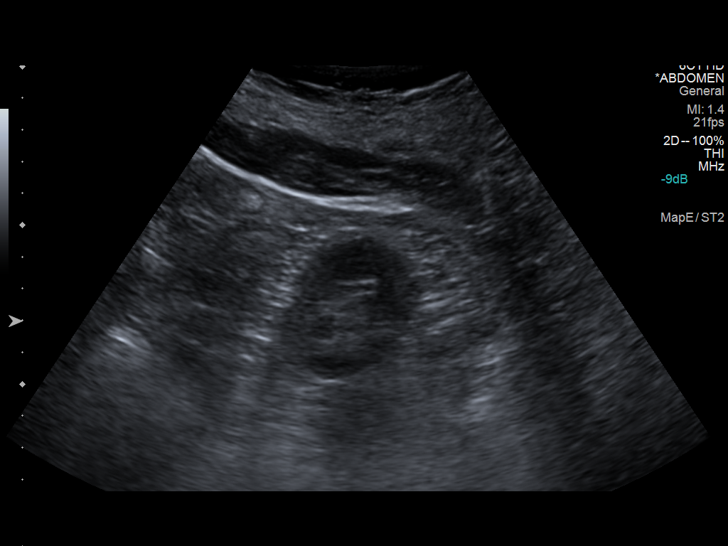

[13 of 25 positions shown; findings below may reference images not displayed]

FINDINGS: Gallbladder:  Surgically absent.  There are no discrete fluid
collections within the gallbladder fossa.

Common bile duct:  Normal in size measuring 4.9 mm in diameter

Liver:  There is diffuse increased echogenicity hepatic parenchyma
suggestive of hepatic steatosis.  No discrete hepatic lesions.  No
intrahepatic biliary duct dilatation.  No ascites.

IVC:  Appears normal.

Pancreas:  Limited visualization of the pancreatic head and neck is
normal.  Visualization of the pancreatic body and tail is obscured
by bowel gas.

Spleen:  Normal in size measuring 8.1 cm in length

Right Kidney:  Normal cortical thickness, echogenicity and size,
measuring 10.9 cm in length.  No focal renal lesions.  No echogenic
renal stones.  No urinary obstruction.

Left Kidney:  Normal cortical thickness, echogenicity and size,
measuring 11.6 cm in length.  No focal renal lesions.  No echogenic
renal stones.  No urinary obstruction.

Abdominal aorta:  No aneurysm identified.
IMPRESSION: 1.  Post cholecystectomy.  There is no discrete fluid collection
within the gallbladder fossa to suggest a postoperative abscess.
Further evaluation may be performed with abdominal CT as clinically
indicated.
2.  Hepatic steatosis.

## 2014-07-16 ENCOUNTER — Telehealth: Payer: Self-pay | Admitting: *Deleted

## 2014-07-16 NOTE — Telephone Encounter (Signed)
Pt informed with negative urine results from [facility] 06/29/14

## 2014-08-13 DIAGNOSIS — M8580 Other specified disorders of bone density and structure, unspecified site: Secondary | ICD-10-CM

## 2014-08-13 HISTORY — DX: Other specified disorders of bone density and structure, unspecified site: M85.80

## 2014-08-27 ENCOUNTER — Ambulatory Visit (INDEPENDENT_AMBULATORY_CARE_PROVIDER_SITE_OTHER): Payer: Medicare Other

## 2014-08-27 ENCOUNTER — Encounter: Payer: Self-pay | Admitting: Gynecology

## 2014-08-27 ENCOUNTER — Other Ambulatory Visit: Payer: Self-pay | Admitting: Gynecology

## 2014-08-27 DIAGNOSIS — M8580 Other specified disorders of bone density and structure, unspecified site: Secondary | ICD-10-CM

## 2014-08-27 DIAGNOSIS — Z13820 Encounter for screening for osteoporosis: Secondary | ICD-10-CM

## 2014-09-10 DIAGNOSIS — K210 Gastro-esophageal reflux disease with esophagitis: Secondary | ICD-10-CM | POA: Diagnosis not present

## 2014-09-10 DIAGNOSIS — E559 Vitamin D deficiency, unspecified: Secondary | ICD-10-CM | POA: Diagnosis not present

## 2014-09-10 DIAGNOSIS — Z79899 Other long term (current) drug therapy: Secondary | ICD-10-CM | POA: Diagnosis not present

## 2014-09-10 DIAGNOSIS — M8580 Other specified disorders of bone density and structure, unspecified site: Secondary | ICD-10-CM | POA: Diagnosis not present

## 2014-10-11 DIAGNOSIS — Z0000 Encounter for general adult medical examination without abnormal findings: Secondary | ICD-10-CM | POA: Diagnosis not present

## 2014-10-24 DIAGNOSIS — H9202 Otalgia, left ear: Secondary | ICD-10-CM | POA: Diagnosis not present

## 2014-10-24 DIAGNOSIS — J329 Chronic sinusitis, unspecified: Secondary | ICD-10-CM | POA: Diagnosis not present

## 2014-10-24 DIAGNOSIS — M2662 Arthralgia of temporomandibular joint: Secondary | ICD-10-CM | POA: Diagnosis not present

## 2014-11-01 DIAGNOSIS — J309 Allergic rhinitis, unspecified: Secondary | ICD-10-CM | POA: Diagnosis not present

## 2014-11-01 DIAGNOSIS — J3489 Other specified disorders of nose and nasal sinuses: Secondary | ICD-10-CM | POA: Diagnosis not present

## 2014-11-01 DIAGNOSIS — R300 Dysuria: Secondary | ICD-10-CM | POA: Diagnosis not present

## 2014-12-03 DIAGNOSIS — J0110 Acute frontal sinusitis, unspecified: Secondary | ICD-10-CM | POA: Diagnosis not present

## 2014-12-03 DIAGNOSIS — J309 Allergic rhinitis, unspecified: Secondary | ICD-10-CM | POA: Diagnosis not present

## 2014-12-17 ENCOUNTER — Telehealth: Payer: Self-pay

## 2014-12-17 DIAGNOSIS — K7581 Nonalcoholic steatohepatitis (NASH): Secondary | ICD-10-CM

## 2014-12-17 NOTE — Telephone Encounter (Signed)
-----   Message from Marzella Schlein, [place] sent at 12/03/2014  8:48 AM EDT -----   ----- Message -----    From: Larina Bras, CMA    Sent: 12/03/2014      To: Larina Bras, CMA  See office note from 05/14/14. Patient needs liver ultrasound (liver only) around 12/2014

## 2014-12-17 NOTE — Telephone Encounter (Signed)
Ultrasound scheduled at [facility] for 12/24/13 10:00am arriving 9:45am. Pt instructed date/time of appt and to have nothing by mouth after midnight. Pt verbalized understanding.

## 2014-12-18 ENCOUNTER — Encounter: Payer: Self-pay | Admitting: Internal Medicine

## 2014-12-25 ENCOUNTER — Other Ambulatory Visit: Payer: Self-pay | Admitting: Internal Medicine

## 2014-12-25 ENCOUNTER — Ambulatory Visit (HOSPITAL_COMMUNITY)
Admission: RE | Admit: 2014-12-25 | Discharge: 2014-12-25 | Disposition: A | Discharge status: Home / Self Care | Payer: Medicare Other | Source: Ambulatory Visit | Attending: Internal Medicine | Admitting: Internal Medicine

## 2014-12-25 DIAGNOSIS — K760 Fatty (change of) liver, not elsewhere classified: Secondary | ICD-10-CM | POA: Diagnosis not present

## 2014-12-25 DIAGNOSIS — K7581 Nonalcoholic steatohepatitis (NASH): Secondary | ICD-10-CM

## 2015-03-10 DIAGNOSIS — J309 Allergic rhinitis, unspecified: Secondary | ICD-10-CM | POA: Diagnosis not present

## 2015-03-10 DIAGNOSIS — F419 Anxiety disorder, unspecified: Secondary | ICD-10-CM | POA: Diagnosis not present

## 2015-03-10 DIAGNOSIS — I10 Essential (primary) hypertension: Secondary | ICD-10-CM | POA: Diagnosis not present

## 2015-04-23 ENCOUNTER — Telehealth: Payer: Self-pay | Admitting: Gastroenterology

## 2015-04-29 NOTE — Telephone Encounter (Signed)
OK as long as she understands that our APPs see patients for office visits when MD schedules are busy.

## 2015-05-07 NOTE — Telephone Encounter (Signed)
Left message on Vmail for pt to c-b.

## 2015-05-14 DIAGNOSIS — L603 Nail dystrophy: Secondary | ICD-10-CM | POA: Diagnosis not present

## 2015-05-14 DIAGNOSIS — R21 Rash and other nonspecific skin eruption: Secondary | ICD-10-CM | POA: Diagnosis not present

## 2015-06-03 DIAGNOSIS — J069 Acute upper respiratory infection, unspecified: Secondary | ICD-10-CM | POA: Diagnosis not present

## 2015-06-22 ENCOUNTER — Encounter (HOSPITAL_COMMUNITY): Payer: Self-pay | Admitting: Nurse Practitioner

## 2015-06-22 ENCOUNTER — Emergency Department (HOSPITAL_COMMUNITY)
Admission: EM | Admit: 2015-06-22 | Discharge: 2015-06-22 | Disposition: A | Discharge status: Home / Self Care | Payer: Medicare Other | Attending: Emergency Medicine | Admitting: Emergency Medicine

## 2015-06-22 DIAGNOSIS — M1990 Unspecified osteoarthritis, unspecified site: Secondary | ICD-10-CM | POA: Diagnosis not present

## 2015-06-22 DIAGNOSIS — Z880 Allergy status to penicillin: Secondary | ICD-10-CM | POA: Insufficient documentation

## 2015-06-22 DIAGNOSIS — B029 Zoster without complications: Secondary | ICD-10-CM | POA: Diagnosis not present

## 2015-06-22 DIAGNOSIS — Z79899 Other long term (current) drug therapy: Secondary | ICD-10-CM | POA: Diagnosis not present

## 2015-06-22 DIAGNOSIS — Z86718 Personal history of other venous thrombosis and embolism: Secondary | ICD-10-CM | POA: Insufficient documentation

## 2015-06-22 DIAGNOSIS — Z87891 Personal history of nicotine dependence: Secondary | ICD-10-CM | POA: Diagnosis not present

## 2015-06-22 DIAGNOSIS — I10 Essential (primary) hypertension: Secondary | ICD-10-CM | POA: Insufficient documentation

## 2015-06-22 DIAGNOSIS — Z8541 Personal history of malignant neoplasm of cervix uteri: Secondary | ICD-10-CM | POA: Diagnosis not present

## 2015-06-22 DIAGNOSIS — Z7982 Long term (current) use of aspirin: Secondary | ICD-10-CM | POA: Diagnosis not present

## 2015-06-22 DIAGNOSIS — Z8659 Personal history of other mental and behavioral disorders: Secondary | ICD-10-CM | POA: Insufficient documentation

## 2015-06-22 DIAGNOSIS — K219 Gastro-esophageal reflux disease without esophagitis: Secondary | ICD-10-CM | POA: Insufficient documentation

## 2015-06-22 DIAGNOSIS — R21 Rash and other nonspecific skin eruption: Secondary | ICD-10-CM | POA: Diagnosis present

## 2015-06-22 HISTORY — DX: Zoster without complications: B02.9

## 2015-06-22 MED ORDER — ACYCLOVIR 400 MG PO TABS: 400.0000 mg | ORAL_TABLET | Freq: Four times a day (QID) | ORAL | Status: DC

## 2015-06-22 NOTE — ED Provider Notes (Signed)
CSN: [id number]     Arrival date & time 06/22/15  1640 History   First MD Initiated Contact with Patient 06/22/15 1806     Chief Complaint  Patient presents with  . Rash     (Consider location/radiation/quality/duration/timing/severity/associated sxs/prior Treatment) HPI   75 year old female with prior history of shingle presenting for evaluation of a rash. Patient report 2 days history of rash to her left anterior shoulder into her left neck and left ear. She described her rash has a burning tingling sensation, moderate in severity, persistent not improving despite using cortisone spray and Benadryl. She reports a recent sinus infection for the past 1-2 weeks which was treated symptomatically by her PCP but sxs persists.  She did requested for antibiotic from her PCP but no antibody was given. She also complaining of a rash to the left roof of her mouth that is tender to eat and drink. She denies any fever, eye pain, vision changes, hearing loss, throat pain, chest pain, difficulty breathing. No other medication changes, or environmental changes.   Past Medical History  Diagnosis Date  . Severe dysplasia of cervix (CIN III)   . DVT (deep venous thrombosis) ([place])   . Anticoagulated   . Osteopenia 08/2014    T score -2.1 FRAX 13%/1.5%  . Fatty liver   . GERD (gastroesophageal reflux disease)   . External hemorrhoid   . Hiatal hernia   . Chronic gastritis   . Depression   . Arthritis   . Hypertension   . Shingles    Past Surgical History  Procedure Laterality Date  . Knee arthroscopy Right   . Tmj arthroplasty    . Tubal ligation    . Laparoscopic cholecystectomy  03/16/2013  . Diagnostic laparoscopic liver biopsy  03/16/2013  . Vaginal hysterectomy  1979    Partial for high-grade dysplasia and dysfunctional bleeding historically   Family History  Problem Relation Age of Onset  . Melanoma Mother   . Stroke Mother   . Heart disease Mother   . Cancer Mother     melonoma  .  Heart disease Father   . Breast cancer Sister 35  . Cancer Sister     breast  . Lung cancer Brother   . Cancer Brother     throat, lung  . Breast cancer Maternal Aunt     Age unknown  . Heart disease Sister   . Throat cancer Brother   . Ovarian cancer      aunt  . Colon cancer Neg Hx    Social History  Substance Use Topics  . Smoking status: Former Smoker    Types: Cigarettes  . Smokeless tobacco: Never Used  . Alcohol Use: No   OB History    Gravida Para Term Preterm AB TAB SAB Ectopic Multiple Living   3 3 3       3      Review of Systems  All other systems reviewed and are negative.     Allergies  Amoxicillin; Cephalexin; Codeine; Nsaids; Penicillins; and Sulfonamide derivatives  Home Medications   Prior to Admission medications   Medication Sig Start Date End Date Taking? Authorizing Provider  aspirin 81 MG chewable tablet Chew 81 mg by mouth daily.     Historical Provider, MD  B Complex Vitamins (VITAMIN B COMPLEX PO) Take 3 capsules by mouth daily.    Historical Provider, MD  Cholecalciferol (VITAMIN D PO) Take 1 tablet by mouth daily.     Historical Provider, MD  clindamycin (CLINDAGEL) 1 % gel Apply 1 application topically 2 (two) times daily as needed (facial rash).  10/27/12   Historical Provider, MD  diazepam (VALIUM) 10 MG tablet Take 1 tablet (10 mg total) by mouth as needed. 02/22/12   Bennetta Laos, MD  dicyclomine (BENTYL) 10 MG capsule Take 1 capsule (10 mg total) by mouth 3 (three) times daily as needed for spasms. Before meals 06/04/14   Lafayette Dragon, MD  fish oil-omega-3 fatty acids 1000 MG capsule Take 1 g by mouth daily.     Historical Provider, MD  furosemide (LASIX) 40 MG tablet Take 60 mg by mouth daily.     Historical Provider, MD  ketoconazole (NIZORAL) 2 % cream Apply 1 application topically daily.  04/10/13   Historical Provider, MD  lansoprazole (PREVACID) 30 MG capsule Take 1 capsule (30 mg total) by mouth daily. 05/14/14   Lafayette Dragon, MD  lisinopril (PRINIVIL,ZESTRIL) 5 MG tablet Take 5 mg by mouth daily.    Historical Provider, MD  VITAMIN E PO Take 1 tablet by mouth daily.     Historical Provider, MD   BP 159/86 mmHg  Pulse 79  Temp(Src) 98.1 F (36.7 C) (Oral)  Resp 16  Ht 5\' 1"  (1.549 m)  Wt 77.629 kg  BMI 32.35 kg/m2  SpO2 95% Physical Exam  Constitutional: She appears well-developed and well-nourished. No distress.  HENT:  Head: Atraumatic.  Right Ear: External ear normal.  Left Ear: External ear normal.  Nose: Nose normal.  Mouth/Throat: Oropharynx is clear and moist.  Mouth: Small erythematous blisters noted to the left upper roof of mouth tender to palpation  Eyes: Conjunctivae and EOM are normal. Pupils are equal, round, and reactive to light.  No dendritic lesions noted on exam, normal conjunctiva and sclera to both eyes  Neck: Neck supple.  Mild tenderness to left trapezius and left paracervical spinal muscle on palpation without any rash.  Neurological: She is alert.  Skin: Rash (Several small confluent erythematous rash noted to left anterior shoulder that is tender to palpation but no obvious abscess and no obvious blister. No petechiae.) noted.  Psychiatric: She has a normal mood and affect.  Nursing note and vitals reviewed.   ED Course  Procedures (including critical care time)   MDM   Final diagnoses:  Shingles outbreak    BP 159/86 mmHg  Pulse 79  Temp(Src) 98.1 F (36.7 C) (Oral)  Resp 16  Ht 5\' 1"  (1.549 m)  Wt 77.629 kg  BMI 32.35 kg/m2  SpO2 95%   6:23 PM patient recently had cold symptoms here with a burning rash involving the left side of her shoulder and neck and ear with rash in the left side of the roof of her mouth. This is likely suggestive of a shingle outbreak. She does not have any eye involvement concerning for herpetic ophthalmicus. No rash to the tip of her nose. My plan is to prescribe acyclovir.  I offer pain medication but pt declined.  Pt will  also f/u with PCP for further care.  Return precaution discussed.   Domenic Moras, PA-C 06/22/15 [place], MD 06/23/15 [id number]

## 2015-06-22 NOTE — Discharge Instructions (Signed)

## 2015-06-22 NOTE — ED Notes (Addendum)
She c/o 2 day history of rash to L anterior shoulder into L neck and L ear pain, feels like there is fluid under the rash. She has had shingles in the past but this does not feel the same. She tried benadryl and cortisone 10 spray with no relief. She was recently treated for sinus infection with abx by pcp but feels those symptoms persist.

## 2015-06-25 DIAGNOSIS — B029 Zoster without complications: Secondary | ICD-10-CM | POA: Diagnosis not present

## 2015-06-25 DIAGNOSIS — K120 Recurrent oral aphthae: Secondary | ICD-10-CM | POA: Diagnosis not present

## 2015-06-28 DIAGNOSIS — Z803 Family history of malignant neoplasm of breast: Secondary | ICD-10-CM | POA: Diagnosis not present

## 2015-06-28 DIAGNOSIS — Z1231 Encounter for screening mammogram for malignant neoplasm of breast: Secondary | ICD-10-CM | POA: Diagnosis not present

## 2015-07-01 ENCOUNTER — Encounter: Payer: Self-pay | Admitting: Gynecology

## 2015-08-19 DIAGNOSIS — M2011 Hallux valgus (acquired), right foot: Secondary | ICD-10-CM | POA: Diagnosis not present

## 2015-08-19 DIAGNOSIS — M7662 Achilles tendinitis, left leg: Secondary | ICD-10-CM | POA: Diagnosis not present

## 2015-08-19 DIAGNOSIS — M722 Plantar fascial fibromatosis: Secondary | ICD-10-CM | POA: Diagnosis not present

## 2015-08-19 DIAGNOSIS — M7661 Achilles tendinitis, right leg: Secondary | ICD-10-CM | POA: Diagnosis not present

## 2015-08-19 DIAGNOSIS — L03031 Cellulitis of right toe: Secondary | ICD-10-CM | POA: Diagnosis not present

## 2015-08-19 DIAGNOSIS — M2042 Other hammer toe(s) (acquired), left foot: Secondary | ICD-10-CM | POA: Diagnosis not present

## 2015-08-19 DIAGNOSIS — M79674 Pain in right toe(s): Secondary | ICD-10-CM | POA: Diagnosis not present

## 2015-08-19 DIAGNOSIS — M2012 Hallux valgus (acquired), left foot: Secondary | ICD-10-CM | POA: Diagnosis not present

## 2015-09-08 DIAGNOSIS — I10 Essential (primary) hypertension: Secondary | ICD-10-CM | POA: Diagnosis not present

## 2015-09-08 DIAGNOSIS — F419 Anxiety disorder, unspecified: Secondary | ICD-10-CM | POA: Diagnosis not present

## 2015-09-08 DIAGNOSIS — J309 Allergic rhinitis, unspecified: Secondary | ICD-10-CM | POA: Diagnosis not present

## 2015-09-10 DIAGNOSIS — M79674 Pain in right toe(s): Secondary | ICD-10-CM | POA: Diagnosis not present

## 2015-09-10 DIAGNOSIS — M722 Plantar fascial fibromatosis: Secondary | ICD-10-CM | POA: Diagnosis not present

## 2015-09-10 DIAGNOSIS — L03031 Cellulitis of right toe: Secondary | ICD-10-CM | POA: Diagnosis not present

## 2015-10-28 DIAGNOSIS — J309 Allergic rhinitis, unspecified: Secondary | ICD-10-CM | POA: Diagnosis not present

## 2015-10-28 DIAGNOSIS — J019 Acute sinusitis, unspecified: Secondary | ICD-10-CM | POA: Diagnosis not present

## 2015-11-07 DIAGNOSIS — D2339 Other benign neoplasm of skin of other parts of face: Secondary | ICD-10-CM | POA: Diagnosis not present

## 2015-12-04 DIAGNOSIS — R21 Rash and other nonspecific skin eruption: Secondary | ICD-10-CM | POA: Diagnosis not present

## 2015-12-15 DIAGNOSIS — R221 Localized swelling, mass and lump, neck: Secondary | ICD-10-CM | POA: Diagnosis not present

## 2015-12-17 ENCOUNTER — Other Ambulatory Visit: Payer: Self-pay | Admitting: Otolaryngology

## 2015-12-17 DIAGNOSIS — R221 Localized swelling, mass and lump, neck: Secondary | ICD-10-CM

## 2015-12-24 ENCOUNTER — Ambulatory Visit
Admission: RE | Admit: 2015-12-24 | Discharge: 2015-12-24 | Disposition: A | Discharge status: Home / Self Care | Payer: Medicare Other | Source: Ambulatory Visit | Attending: Otolaryngology | Admitting: Otolaryngology

## 2015-12-24 DIAGNOSIS — R221 Localized swelling, mass and lump, neck: Secondary | ICD-10-CM | POA: Diagnosis not present

## 2015-12-24 MED ORDER — IOPAMIDOL (ISOVUE-300) INJECTION 61%
75.0000 mL | Freq: Once | INTRAVENOUS | Status: AC | PRN
  Administered 2015-12-24: 75 mL via INTRAVENOUS

## 2016-01-30 DIAGNOSIS — M71571 Other bursitis, not elsewhere classified, right ankle and foot: Secondary | ICD-10-CM | POA: Diagnosis not present

## 2016-01-30 DIAGNOSIS — M7731 Calcaneal spur, right foot: Secondary | ICD-10-CM | POA: Diagnosis not present

## 2016-01-30 DIAGNOSIS — M722 Plantar fascial fibromatosis: Secondary | ICD-10-CM | POA: Diagnosis not present

## 2016-01-30 DIAGNOSIS — M76821 Posterior tibial tendinitis, right leg: Secondary | ICD-10-CM | POA: Diagnosis not present

## 2016-01-30 DIAGNOSIS — M7732 Calcaneal spur, left foot: Secondary | ICD-10-CM | POA: Diagnosis not present

## 2016-01-30 DIAGNOSIS — M71572 Other bursitis, not elsewhere classified, left ankle and foot: Secondary | ICD-10-CM | POA: Diagnosis not present

## 2016-01-30 DIAGNOSIS — M76822 Posterior tibial tendinitis, left leg: Secondary | ICD-10-CM | POA: Diagnosis not present

## 2016-02-05 DIAGNOSIS — M71571 Other bursitis, not elsewhere classified, right ankle and foot: Secondary | ICD-10-CM | POA: Diagnosis not present

## 2016-02-05 DIAGNOSIS — M722 Plantar fascial fibromatosis: Secondary | ICD-10-CM | POA: Diagnosis not present

## 2016-02-05 DIAGNOSIS — M71572 Other bursitis, not elsewhere classified, left ankle and foot: Secondary | ICD-10-CM | POA: Diagnosis not present

## 2016-03-10 DIAGNOSIS — I10 Essential (primary) hypertension: Secondary | ICD-10-CM | POA: Diagnosis not present

## 2016-03-10 DIAGNOSIS — J309 Allergic rhinitis, unspecified: Secondary | ICD-10-CM | POA: Diagnosis not present

## 2016-03-10 DIAGNOSIS — R05 Cough: Secondary | ICD-10-CM | POA: Diagnosis not present

## 2016-03-10 DIAGNOSIS — F419 Anxiety disorder, unspecified: Secondary | ICD-10-CM | POA: Diagnosis not present

## 2016-03-31 DIAGNOSIS — L821 Other seborrheic keratosis: Secondary | ICD-10-CM | POA: Diagnosis not present

## 2016-03-31 DIAGNOSIS — L570 Actinic keratosis: Secondary | ICD-10-CM | POA: Diagnosis not present

## 2016-03-31 DIAGNOSIS — L819 Disorder of pigmentation, unspecified: Secondary | ICD-10-CM | POA: Diagnosis not present

## 2016-04-16 ENCOUNTER — Telehealth: Payer: Self-pay | Admitting: Gastroenterology

## 2016-04-16 NOTE — Telephone Encounter (Signed)
OK 

## 2016-04-16 NOTE — Telephone Encounter (Signed)
Dr. Nandigam will you accept this patient? °

## 2016-04-19 NOTE — Telephone Encounter (Signed)
Ok, please inform patient I haven't been here long time, I think she wanted to see someone who has been here for a long period according to one of the previous telephone notes. Thanks

## 2016-04-21 NOTE — Telephone Encounter (Signed)
LM on Vmail to CB  °

## 2016-04-27 DIAGNOSIS — R221 Localized swelling, mass and lump, neck: Secondary | ICD-10-CM | POA: Diagnosis not present

## 2016-06-25 DIAGNOSIS — R05 Cough: Secondary | ICD-10-CM | POA: Diagnosis not present

## 2016-06-28 DIAGNOSIS — Z1231 Encounter for screening mammogram for malignant neoplasm of breast: Secondary | ICD-10-CM | POA: Diagnosis not present

## 2016-06-28 DIAGNOSIS — Z803 Family history of malignant neoplasm of breast: Secondary | ICD-10-CM | POA: Diagnosis not present

## 2016-08-18 ENCOUNTER — Other Ambulatory Visit: Payer: Self-pay | Admitting: Otolaryngology

## 2016-08-18 DIAGNOSIS — R208 Other disturbances of skin sensation: Secondary | ICD-10-CM

## 2016-08-18 DIAGNOSIS — R519 Headache, unspecified: Secondary | ICD-10-CM

## 2016-08-18 DIAGNOSIS — G8929 Other chronic pain: Secondary | ICD-10-CM | POA: Diagnosis not present

## 2016-08-18 DIAGNOSIS — R51 Headache: Secondary | ICD-10-CM | POA: Diagnosis not present

## 2016-08-23 ENCOUNTER — Ambulatory Visit: Payer: BLUE CROSS/BLUE SHIELD | Admitting: Gastroenterology

## 2016-08-25 ENCOUNTER — Other Ambulatory Visit (INDEPENDENT_AMBULATORY_CARE_PROVIDER_SITE_OTHER): Payer: Medicare Other

## 2016-08-25 ENCOUNTER — Encounter: Payer: Self-pay | Admitting: Gastroenterology

## 2016-08-25 ENCOUNTER — Encounter (INDEPENDENT_AMBULATORY_CARE_PROVIDER_SITE_OTHER): Payer: Self-pay

## 2016-08-25 ENCOUNTER — Ambulatory Visit (INDEPENDENT_AMBULATORY_CARE_PROVIDER_SITE_OTHER): Payer: Medicare Other | Admitting: Gastroenterology

## 2016-08-25 VITALS — BP 118/70 | HR 74 | Resp 18 | Ht 60.0 in | Wt 170.0 lb

## 2016-08-25 DIAGNOSIS — R109 Unspecified abdominal pain: Secondary | ICD-10-CM

## 2016-08-25 DIAGNOSIS — K760 Fatty (change of) liver, not elsewhere classified: Secondary | ICD-10-CM | POA: Diagnosis not present

## 2016-08-25 DIAGNOSIS — R140 Abdominal distension (gaseous): Secondary | ICD-10-CM

## 2016-08-25 LAB — CBC WITH DIFFERENTIAL/PLATELET
Basophils Absolute: 0.1 10*3/uL (ref 0.0–0.1)
Basophils Relative: 1.5 % (ref 0.0–3.0)
Eosinophils Absolute: 0.4 10*3/uL (ref 0.0–0.7)
Eosinophils Relative: 3.9 % (ref 0.0–5.0)
HCT: 44.1 % (ref 36.0–46.0)
Hemoglobin: 15 g/dL (ref 12.0–15.0)
Lymphocytes Relative: 37 % (ref 12.0–46.0)
Lymphs Abs: 3.6 10*3/uL (ref 0.7–4.0)
MCHC: 34.1 g/dL (ref 30.0–36.0)
MCV: 89 fl (ref 78.0–100.0)
MONO ABS: 0.6 10*3/uL (ref 0.1–1.0)
Monocytes Relative: 6.1 % (ref 3.0–12.0)
NEUTROS ABS: 5 10*3/uL (ref 1.4–7.7)
Neutrophils Relative %: 51.5 % (ref 43.0–77.0)
PLATELETS: 352 10*3/uL (ref 150.0–400.0)
RBC: 4.95 Mil/uL (ref 3.87–5.11)
RDW: 13.3 % (ref 11.5–15.5)
WBC: 9.7 10*3/uL (ref 4.0–10.5)

## 2016-08-25 LAB — COMPREHENSIVE METABOLIC PANEL
ALT: 36 U/L — AB (ref 0–35)
AST: 29 U/L (ref 0–37)
Albumin: 4.6 g/dL (ref 3.5–5.2)
Alkaline Phosphatase: 46 U/L (ref 39–117)
BILIRUBIN TOTAL: 0.6 mg/dL (ref 0.2–1.2)
BUN: 12 mg/dL (ref 6–23)
CO2: 31 mEq/L (ref 19–32)
Calcium: 10.1 mg/dL (ref 8.4–10.5)
Chloride: 101 mEq/L (ref 96–112)
Creatinine, Ser: 0.79 mg/dL (ref 0.40–1.20)
GFR: 75.57 mL/min (ref >60.00)
GLUCOSE: 104 mg/dL — AB (ref 70–99)
Potassium: 4.3 mEq/L (ref 3.5–5.1)
Sodium: 140 mEq/L (ref 135–145)
Total Protein: 7.6 g/dL (ref 6.0–8.3)

## 2016-08-25 LAB — FOLATE: Folate: >23.5 ng/mL (ref >5.9)

## 2016-08-25 LAB — FERRITIN: Ferritin: 124.2 ng/mL (ref 10.0–291.0)

## 2016-08-25 LAB — PROTIME-INR
INR: 1 ratio (ref 0.8–1.0)
Prothrombin Time: 10.8 s (ref 9.6–13.1)

## 2016-08-25 LAB — VITAMIN B12

## 2016-08-25 NOTE — Patient Instructions (Signed)
Go to the basement for labs today  Follow up as needed  Use FD Donald Prose three times a day as needed

## 2016-08-25 NOTE — Progress Notes (Signed)
Katelyn Taylor    [id number]    [birth year]  Primary Care Physician:HEPLER,MARK, PA-C  Referring Physician: Corine Shelter, PA-C [address] [place], [place] [address]  Chief complaint:  Fatty liver  HPI: 76 year old female previously followed by Dr. Olevia Perches, as seen in office in December 2015 is here to reestablish care. She has history of steatohepatitis diagnosed on liver biopsy in October 2015 when she underwent cholecystectomy. Last colonoscopy in October 2010 was normal and random biopsies negative for microscopic colitis. She was on Prevacid 30 mg daily for dyspepsia symptoms but has stopped taking it in the past 2 years because of potential side effects. She is currently taking Tums as needed.  She sprained her back yesterday when she lifted mattress while changing sheets. She also complained of pain on the left side of her face associated with numbness and burning, was evaluated by Dr. Erik Obey (ENT) and is scheduled to undergo MRI of the head.  Denies any nausea, vomiting, abdominal pain, melena or bright red blood per rectum    Outpatient Encounter Prescriptions as of 08/25/2016  Medication Sig  . aspirin 81 MG chewable tablet Chew 81 mg by mouth daily.   . B Complex Vitamins (VITAMIN B COMPLEX PO) Take 3 capsules by mouth daily.  . Cholecalciferol (VITAMIN D PO) Take 1 tablet by mouth daily.   . diazepam (VALIUM) 10 MG tablet Take 1 tablet (10 mg total) by mouth as needed.  . fish oil-omega-3 fatty acids 1000 MG capsule Take 1 g by mouth daily.   . furosemide (LASIX) 40 MG tablet Take 60 mg by mouth daily.   Marland Kitchen ketoconazole (NIZORAL) 2 % cream Apply 1 application topically daily.   Marland Kitchen lisinopril (PRINIVIL,ZESTRIL) 5 MG tablet Take 5 mg by mouth daily.  Marland Kitchen VITAMIN E PO Take 1 tablet by mouth daily.   Marland Kitchen acyclovir (ZOVIRAX) 400 MG tablet Take 1 tablet (400 mg total) by mouth 4 (four) times daily. (Patient not taking: Reported on 08/25/2016)  . clindamycin  (CLINDAGEL) 1 % gel Apply 1 application topically 2 (two) times daily as needed (facial rash).   Marland Kitchen dicyclomine (BENTYL) 10 MG capsule Take 1 capsule (10 mg total) by mouth 3 (three) times daily as needed for spasms. Before meals (Patient not taking: Reported on 08/25/2016)  . lansoprazole (PREVACID) 30 MG capsule Take 1 capsule (30 mg total) by mouth daily. (Patient not taking: Reported on 08/25/2016)   No facility-administered encounter medications on file as of 08/25/2016.     Allergies as of 08/25/2016 - Review Complete 08/25/2016  Allergen Reaction Noted  . Amoxicillin Other (See Comments)   . Cephalexin Other (See Comments) 01/28/2009  . Codeine Nausea And Vomiting 10/21/2010  . Nsaids Other (See Comments) 04/14/2013  . Penicillins Other (See Comments) 10/21/2010  . Sulfonamide derivatives Other (See Comments)     Past Medical History:  Diagnosis Date  . Anticoagulated   . Arthritis   . Chronic gastritis   . Depression   . DVT (deep venous thrombosis) ([place])   . External hemorrhoid   . Fatty liver   . GERD (gastroesophageal reflux disease)   . Hiatal hernia   . Hypertension   . Osteopenia 08/2014   T score -2.1 FRAX 13%/1.5%  . Severe dysplasia of cervix (CIN III)   . Shingles     Past Surgical History:  Procedure Laterality Date  . DIAGNOSTIC LAPAROSCOPIC LIVER BIOPSY  03/16/2013  .  KNEE ARTHROSCOPY Right   . LAPAROSCOPIC CHOLECYSTECTOMY  03/16/2013  . TMJ ARTHROPLASTY    . TUBAL LIGATION    . VAGINAL HYSTERECTOMY  1979   Partial for high-grade dysplasia and dysfunctional bleeding historically    Family History  Problem Relation Age of Onset  . Melanoma Mother   . Stroke Mother   . Heart disease Mother   . Cancer Mother     melonoma  . Heart disease Father   . Breast cancer Sister 77  . Cancer Sister     breast  . Lung cancer Brother   . Cancer Brother     throat, lung  . Breast cancer Maternal Aunt     Age unknown  . Heart disease Sister   . Throat  cancer Brother   . Ovarian cancer      aunt  . Colon cancer Neg Hx     Social History   Social History  . Marital status: Married    Spouse name: N/A  . Number of children: 3  . Years of education: N/A   Occupational History  . Housewife Retired  . caregiver    Social History Main Topics  . Smoking status: Former Smoker    Types: Cigarettes  . Smokeless tobacco: Never Used  . Alcohol use No  . Drug use: No  . Sexual activity: No     Comment: 1st encounter over age 53; 3 partners lifetime   Other Topics Concern  . Not on file   Social History Narrative  . No narrative on file      Review of systems: Review of Systems  Constitutional: Negative for fever and chills.  positive for lack of energy HENT: Positive for sinus problems, runny nose   Eyes: Negative for blurred vision.  Respiratory: Negative for cough, shortness of breath and wheezing.   Cardiovascular: Negative for chest pain and palpitations.  Gastrointestinal: as per HPI Genitourinary: Negative for dysuria, urgency, frequency and hematuria.  Musculoskeletal: Positive for myalgias, back pain and joint pain.  Skin: Negative for itching and rash.  Neurological: Negative for dizziness, tremors, focal weakness, seizures and loss of consciousness.  positive for frequent headaches Endo/Heme/Allergies: Positive for seasonal allergies.  Psychiatric/Behavioral: Negative for depression, suicidal ideas and hallucinations.  positive for anxiety and panic attacks All other systems reviewed and are negative.   Physical Exam: Vitals:   08/25/16 0915  BP: 118/70  Pulse: 74  Resp: 18   Body mass index is 33.2 kg/m. Gen:      No acute distress HEENT:  EOMI, sclera anicteric Neck:     No masses; no thyromegaly Lungs:    Clear to auscultation bilaterally; normal respiratory effort CV:         Regular rate and rhythm; no murmurs Abd:      + bowel sounds; soft, non-tender; no palpable masses, no distension Ext:     No edema; adequate peripheral perfusion Skin:      Warm and dry; no rash Neuro: alert and oriented x 3 Psych: normal mood and affect  Data Reviewed:  Reviewed labs, radiology imaging, old records and pertinent past GI work up   Assessment and Plan/Recommendations:  76 year old female with history of chronic gastritis, dyspepsia and steatohepatitis here for follow-up visit  We will check CBC, BMP, LFT, PT /INR to monitor fatty liver If has any significant elevation of transaminases will consider liver ultrasound  Chronic gastritis and dyspepsia Follow-up ferritin and B12 level FD Gard 1 capsule TID  as needed  Return as needed  25 minutes was spent face-to-face with the patient. Greater than 50% of the time used for counseling as well as treatment plan and follow-up. She had multiple questions which were answered to her satisfaction  K. Denzil Magnuson , MD [phone] Mon-Fri 8a-5p [phone] after 5p, weekends, holidays  CC: Corine Shelter, PA-C

## 2016-08-26 DIAGNOSIS — S39012A Strain of muscle, fascia and tendon of lower back, initial encounter: Secondary | ICD-10-CM | POA: Diagnosis not present

## 2016-08-27 ENCOUNTER — Other Ambulatory Visit: Payer: Self-pay

## 2016-08-27 DIAGNOSIS — K7581 Nonalcoholic steatohepatitis (NASH): Secondary | ICD-10-CM

## 2016-08-27 DIAGNOSIS — K760 Fatty (change of) liver, not elsewhere classified: Secondary | ICD-10-CM

## 2016-08-27 DIAGNOSIS — R109 Unspecified abdominal pain: Secondary | ICD-10-CM

## 2016-09-01 ENCOUNTER — Other Ambulatory Visit: Payer: BLUE CROSS/BLUE SHIELD

## 2016-09-06 DIAGNOSIS — F419 Anxiety disorder, unspecified: Secondary | ICD-10-CM | POA: Diagnosis not present

## 2016-09-06 DIAGNOSIS — I10 Essential (primary) hypertension: Secondary | ICD-10-CM | POA: Diagnosis not present

## 2016-09-06 DIAGNOSIS — E538 Deficiency of other specified B group vitamins: Secondary | ICD-10-CM | POA: Diagnosis not present

## 2016-09-06 DIAGNOSIS — Z0000 Encounter for general adult medical examination without abnormal findings: Secondary | ICD-10-CM | POA: Diagnosis not present

## 2016-09-07 ENCOUNTER — Ambulatory Visit
Admission: RE | Admit: 2016-09-07 | Discharge: 2016-09-07 | Disposition: A | Discharge status: Home / Self Care | Payer: Medicare Other | Source: Ambulatory Visit | Attending: Otolaryngology | Admitting: Otolaryngology

## 2016-09-07 DIAGNOSIS — R519 Headache, unspecified: Secondary | ICD-10-CM

## 2016-09-07 DIAGNOSIS — R208 Other disturbances of skin sensation: Secondary | ICD-10-CM

## 2016-09-07 DIAGNOSIS — R51 Headache: Secondary | ICD-10-CM | POA: Diagnosis not present

## 2016-09-21 DIAGNOSIS — H8111 Benign paroxysmal vertigo, right ear: Secondary | ICD-10-CM | POA: Diagnosis not present

## 2016-10-06 ENCOUNTER — Encounter: Payer: Self-pay | Admitting: Diagnostic Neuroimaging

## 2016-10-06 ENCOUNTER — Ambulatory Visit (INDEPENDENT_AMBULATORY_CARE_PROVIDER_SITE_OTHER): Payer: Medicare Other | Admitting: Diagnostic Neuroimaging

## 2016-10-06 VITALS — BP 118/68 | HR 68 | Ht 60.0 in | Wt 172.6 lb

## 2016-10-06 DIAGNOSIS — R209 Unspecified disturbances of skin sensation: Secondary | ICD-10-CM | POA: Diagnosis not present

## 2016-10-06 DIAGNOSIS — R220 Localized swelling, mass and lump, head: Secondary | ICD-10-CM | POA: Diagnosis not present

## 2016-10-06 DIAGNOSIS — R202 Paresthesia of skin: Secondary | ICD-10-CM

## 2016-10-06 NOTE — Patient Instructions (Signed)
Thank you for coming to see [place] at [facility]. I hope we have been able to provide you high quality care today.  You may receive a patient satisfaction survey over the next few weeks. We would appreciate your feedback and comments so that we may continue to improve ourselves and the health of our patients.  - monitor symptoms; no further neuro testing advised  - may consider gabapentin or topical cream for pain control if symptoms worsen  - recommend PT for gait training and vestibular PT for vertigo exercises   ~~~~~~~~~~~~~~~~~~~~~~~~~~~~~~~~~~~~~~~~~~~~~~~~~~~~~~~~~~~~~~~~~  DR. PENUMALLI'S GUIDE TO HAPPY AND HEALTHY LIVING These are some of my general health and wellness recommendations. Some of them may apply to you better than others. Please use common sense as you try these suggestions and feel free to ask me any questions.   ACTIVITY/FITNESS Mental, social, emotional and physical stimulation are very important for brain and body health. Try learning a new activity (arts, music, language, sports, games).  Keep moving your body to the best of your abilities. You can do this at home, inside or outside, the park, community center, gym or anywhere you like. Consider a physical therapist or personal trainer to get started. Consider the app Sworkit. Fitness trackers such as smart-watches, smart-phones or Fitbits can help as well.   NUTRITION Eat more plants: colorful vegetables, nuts, seeds and berries.  Eat less sugar, salt, preservatives and processed foods.  Avoid toxins such as cigarettes and alcohol.  Drink water when you are thirsty. Warm water with a slice of lemon is an excellent morning drink to start the day.  Consider these websites for more information The Nutrition Source (https://www.henry-hernandez.biz/) Precision Nutrition (WindowBlog.ch)   RELAXATION Consider practicing mindfulness meditation or  other relaxation techniques such as deep breathing, prayer, yoga, tai chi, massage. See website mindful.org or the apps Headspace or Calm to help get started.   SLEEP Try to get at least 7-8+ hours sleep per day. Regular exercise and reduced caffeine will help you sleep better. Practice good sleep hygeine techniques. See website sleep.org for more information.   PLANNING Prepare estate planning, living will, healthcare POA documents. Sometimes this is best planned with the help of an attorney. Theconversationproject.org and agingwithdignity.org are excellent resources.

## 2016-10-06 NOTE — Progress Notes (Signed)
[facility]  PATIENT: Katelyn Taylor DOB: 03-Jun-1941  REFERRING CLINICIAN: Wolicki HISTORY FROM: patient  REASON FOR VISIT: new consult    HISTORICAL  CHIEF COMPLAINT:  Chief Complaint  Patient presents with  . NP Wolicki  . Possible Trigeminal Neuralgia    L facial knot ( tender to touch, burning).  Has had previous MRI    HISTORY OF PRESENT ILLNESS:   76 year old female here for evaluation of left facial fullness and numbness sensation.  Patient reports at least one to 2 year onset of swollen appearance to her left cheek/mandibular region within the soft tissue. Patient has had extensive evaluation with CT scan, MRI, ENT evaluation. No specific causes been found. In addition patient has history of left facial skin cancer resection from 8-10 years ago.  Patient also reports son and skin sensitivity in the left facial region. Patient also feels intermittent numbness and burning sensation in this region.  Patient referred to me for evaluation of possible left trigeminal neuralgia.    REVIEW OF SYSTEMS: Full 14 system review of systems performed and negative with exception of: Weight gain fatigue swelling in legs blurred vision sleepiness restless legs headache weakness dizziness and hot and cold increased thirst flushing joint pain aching muscles racing thoughts anxiety attacks.   ALLERGIES: Allergies  Allergen Reactions  . Amoxicillin Other (See Comments)    Yeast infection  . Cephalexin Other (See Comments)    headache  . Codeine Nausea And Vomiting  . Nsaids Other (See Comments)    Headache, can take aspirin  . Penicillins Other (See Comments)    Yeast infections  . Sulfonamide Derivatives Other (See Comments)    headache    HOME MEDICATIONS: Outpatient Medications Prior to Visit  Medication Sig Dispense Refill  . aspirin 81 MG chewable tablet Chew 81 mg by mouth daily.     . B Complex Vitamins (VITAMIN B COMPLEX PO) Take 3 capsules by  mouth daily.    . Cholecalciferol (VITAMIN D PO) Take 1 tablet by mouth daily.     . clindamycin (CLINDAGEL) 1 % gel Apply 1 application topically 2 (two) times daily as needed (facial rash).     . diazepam (VALIUM) 10 MG tablet Take 1 tablet (10 mg total) by mouth as needed. 30 tablet 3  . fish oil-omega-3 fatty acids 1000 MG capsule Take 1 g by mouth daily.     . furosemide (LASIX) 40 MG tablet Take 60 mg by mouth daily.     Marland Kitchen ketoconazole (NIZORAL) 2 % cream Apply 1 application topically daily.     Marland Kitchen lisinopril (PRINIVIL,ZESTRIL) 5 MG tablet Take 5 mg by mouth daily.    Marland Kitchen VITAMIN E PO Take 1 tablet by mouth daily.     Marland Kitchen acyclovir (ZOVIRAX) 400 MG tablet Take 1 tablet (400 mg total) by mouth 4 (four) times daily. (Patient not taking: Reported on 08/25/2016) 50 tablet 0  . dicyclomine (BENTYL) 10 MG capsule Take 1 capsule (10 mg total) by mouth 3 (three) times daily as needed for spasms. Before meals (Patient not taking: Reported on 08/25/2016) 90 capsule 3  . lansoprazole (PREVACID) 30 MG capsule Take 1 capsule (30 mg total) by mouth daily. (Patient not taking: Reported on 08/25/2016) 90 capsule 2   No facility-administered medications prior to visit.     PAST MEDICAL HISTORY: Past Medical History:  Diagnosis Date  . Anticoagulated   . Anxiety   . Arthritis   . Chronic gastritis   .  Depression   . DVT (deep venous thrombosis) ([place])   . External hemorrhoid   . Fatty liver   . GERD (gastroesophageal reflux disease)   . Hiatal hernia   . Hx of migraines   . Hypertension   . Osteopenia 08/2014   T score -2.1 FRAX 13%/1.5%  . Severe dysplasia of cervix (CIN III)   . Shingles     PAST SURGICAL HISTORY: Past Surgical History:  Procedure Laterality Date  . DIAGNOSTIC LAPAROSCOPIC LIVER BIOPSY  03/16/2013  . KNEE ARTHROSCOPY Right   . LAPAROSCOPIC CHOLECYSTECTOMY  03/16/2013  . TMJ ARTHROPLASTY    . TUBAL LIGATION    . VAGINAL HYSTERECTOMY  1979   Partial for high-grade dysplasia and  dysfunctional bleeding historically    FAMILY HISTORY: Family History  Problem Relation Age of Onset  . Melanoma Mother   . Stroke Mother   . Heart disease Mother   . Cancer Mother     melonoma  . Heart disease Father   . Breast cancer Sister 49  . Cancer Sister     breast  . Lung cancer Brother   . Cancer Brother     throat, lung  . Breast cancer Maternal Aunt     Age unknown  . Heart disease Sister   . Throat cancer Brother   . Ovarian cancer      aunt  . Colon cancer Neg Hx     SOCIAL HISTORY:  Social History   Social History  . Marital status: Married    Spouse name: N/A  . Number of children: 3  . Years of education: N/A   Occupational History  . Housewife Retired  . caregiver    Social History Main Topics  . Smoking status: Former Smoker    Types: Cigarettes  . Smokeless tobacco: Never Used  . Alcohol use No  . Drug use: No  . Sexual activity: No     Comment: 1st encounter over age 39; 3 partners lifetime   Other Topics Concern  . Not on file   Social History Narrative   Lives home with husband, son, grandson.  Children 3.      PHYSICAL EXAM  GENERAL EXAM/CONSTITUTIONAL: Vitals:  Vitals:   10/06/16 1102  BP: 118/68  Pulse: 68  Weight: 172 lb 9.6 oz (78.3 kg)  Height: 5' (1.524 m)     Body mass index is 33.71 kg/m.  Visual Acuity Screening   Right eye Left eye Both eyes  Without correction:     With correction: 20/20 20/30      Patient is in no distress; well developed, nourished and groomed; neck is supple  SLIGHT FULLNESS IN LEFT MANDIBULAR BUCCAL FATTY TISSUE; NO NODULE OR TENDERNESS  CARDIOVASCULAR:  Examination of carotid arteries is normal; no carotid bruits  Regular rate and rhythm, no murmurs  Examination of peripheral vascular system by observation and palpation is normal  EYES:  Ophthalmoscopic exam of optic discs and posterior segments is normal; no papilledema or hemorrhages  MUSCULOSKELETAL:  Gait,  strength, tone, movements noted in Neurologic exam below  NEUROLOGIC: MENTAL STATUS:  No flowsheet data found.  awake, alert, oriented to person, place and time  recent and remote memory intact  normal attention and concentration  language fluent, comprehension intact, naming intact,   fund of knowledge appropriate  CRANIAL NERVE:   2nd - no papilledema on fundoscopic exam  2nd, 3rd, 4th, 6th - pupils equal and reactive to light, visual fields full to confrontation,  extraocular muscles intact, no nystagmus  5th - facial sensation symmetric  7th - facial strength symmetric  8th - hearing intact  9th - palate elevates symmetrically, uvula midline  11th - shoulder shrug symmetric  12th - tongue protrusion midline  MOTOR:   normal bulk and tone, full strength in the BUE, BLE  SENSORY:   normal and symmetric to light touch, temperature, vibration  COORDINATION:   finger-nose-finger, fine finger movements normal  REFLEXES:   deep tendon reflexes present and symmetric  GAIT/STATION:   narrow based gait; romberg is negative     DIAGNOSTIC DATA (LABS, IMAGING, TESTING) - I reviewed patient records, labs, notes, testing and imaging myself where available.  Lab Results  Component Value Date   WBC 9.7 08/25/2016   HGB 15.0 08/25/2016   HCT 44.1 08/25/2016   MCV 89.0 08/25/2016   PLT 352.0 08/25/2016      Component Value Date/Time   NA 140 08/25/2016 1026   K 4.3 08/25/2016 1026   CL 101 08/25/2016 1026   CO2 31 08/25/2016 1026   GLUCOSE 104 (H) 08/25/2016 1026   BUN 12 08/25/2016 1026   CREATININE 0.79 08/25/2016 1026   CREATININE 0.74 03/26/2013 0851   CALCIUM 10.1 08/25/2016 1026   PROT 7.6 08/25/2016 1026   ALBUMIN 4.6 08/25/2016 1026   AST 29 08/25/2016 1026   ALT 36 (H) 08/25/2016 1026   ALKPHOS 46 08/25/2016 1026   BILITOT 0.6 08/25/2016 1026   GFRNONAA 88 (L) 04/13/2013 2320   GFRAA >90 04/13/2013 2320   Lab Results  Component Value  Date   CHOL 135 02/22/2012   HDL 57 02/22/2012   LDLCALC 62 02/22/2012   TRIG 78 02/22/2012   CHOLHDL 2.4 02/22/2012   No results found for: HGBA1C Lab Results  Component Value Date   VITAMINB12 >1500 (H) 08/25/2016   No results found for: TSH  09/07/16 MRI brain [I reviewed images myself and agree with interpretation. Slight asymm of buccal fatty tissue more noticeable on left than right side. No underlying nodule or tumor. -VRP]  - No acute abnormality.  No cause for facial pain identified. - Periventricular and deep white matter lesions appear chronic and likely related to microvascular ischemia.  12/24/15 CT neck [I reviewed images myself and agree with interpretation. -VRP]  - Normal CT of the neck. No evidence of mass or lymphadenopathy. No visible asymmetry of the normal structures. No imaging explanation for the clinical impression of swelling of the left cheek and face.     ASSESSMENT AND PLAN  76 y.o. year old female here with subjective left facial swelling and left facial paresthesias for past 2 years. Neurologic examination unremarkable. MRI of the brain and CT scan of the neck also unremarkable which I reviewed. There is slight asymmetric enlargement of her left face subcutaneous fatty tissue compared to the right side, which I can see on physical exam as well as imaging studies, but no underlying lesion, nodule or other pathology. I suspect this is a benign asymmetry of patient's facial structures, which could be partially related to her history of left facial skin cancer resection. The subjective sensation she is feeling in this region could be due to local swelling and asymmetry. I do not think patient has underlying trigeminal neuralgia.   Ddx: left facial swelling (fatty deposit) + left facial paresthesia (due to local swelling)  1. Left facial swelling   2. Facial paresthesia      PLAN: - monitor symptoms; no further  neuro testing advised - may consider  gabapentin or topical cream for pain control if symptoms worsen - recommend PT for gait training and vestibular PT for vertigo exercises  Return if symptoms worsen or fail to improve, for return to PCP and ENT.    Penni Bombard, MD 11/22/6433, 39:12 AM Certified in Neurology, Neurophysiology and Neuroimaging  [facility] [address] [place], [place] [address] [phone]

## 2016-11-17 DIAGNOSIS — R3989 Other symptoms and signs involving the genitourinary system: Secondary | ICD-10-CM | POA: Diagnosis not present

## 2016-11-17 DIAGNOSIS — M545 Low back pain: Secondary | ICD-10-CM | POA: Diagnosis not present

## 2016-11-17 DIAGNOSIS — R42 Dizziness and giddiness: Secondary | ICD-10-CM | POA: Diagnosis not present

## 2016-11-29 ENCOUNTER — Telehealth: Payer: Self-pay

## 2016-11-29 ENCOUNTER — Other Ambulatory Visit: Payer: Self-pay

## 2016-11-29 DIAGNOSIS — R7989 Other specified abnormal findings of blood chemistry: Secondary | ICD-10-CM

## 2016-11-29 DIAGNOSIS — R945 Abnormal results of liver function studies: Principal | ICD-10-CM

## 2016-11-29 NOTE — Telephone Encounter (Signed)
-----   Message from Barron Alvine, RN sent at 08/27/2016 10:28 AM EDT ----- Pt to get labs see 08/27/16 note order in [facility]

## 2016-11-29 NOTE — Telephone Encounter (Signed)
Patient requests a recheck of her LFT. Please advise.

## 2016-11-29 NOTE — Telephone Encounter (Signed)
Advised patient that it was ordered.

## 2016-11-29 NOTE — Telephone Encounter (Signed)
Ok to recheck LFT. Thanks

## 2016-11-30 DIAGNOSIS — R42 Dizziness and giddiness: Secondary | ICD-10-CM | POA: Diagnosis not present

## 2016-12-07 ENCOUNTER — Other Ambulatory Visit (INDEPENDENT_AMBULATORY_CARE_PROVIDER_SITE_OTHER): Payer: Medicare Other

## 2016-12-07 DIAGNOSIS — R42 Dizziness and giddiness: Secondary | ICD-10-CM | POA: Diagnosis not present

## 2016-12-07 DIAGNOSIS — R109 Unspecified abdominal pain: Secondary | ICD-10-CM

## 2016-12-07 DIAGNOSIS — K7581 Nonalcoholic steatohepatitis (NASH): Secondary | ICD-10-CM | POA: Diagnosis not present

## 2016-12-07 DIAGNOSIS — R7989 Other specified abnormal findings of blood chemistry: Secondary | ICD-10-CM | POA: Diagnosis not present

## 2016-12-07 DIAGNOSIS — R945 Abnormal results of liver function studies: Secondary | ICD-10-CM

## 2016-12-07 DIAGNOSIS — K760 Fatty (change of) liver, not elsewhere classified: Secondary | ICD-10-CM

## 2016-12-07 LAB — HEPATIC FUNCTION PANEL
ALT: 41 U/L — ABNORMAL HIGH (ref 0–35)
AST: 35 U/L (ref 0–37)
Albumin: 4.1 g/dL (ref 3.5–5.2)
Alkaline Phosphatase: 44 U/L (ref 39–117)
BILIRUBIN DIRECT: 0.1 mg/dL (ref 0.0–0.3)
TOTAL PROTEIN: 6.6 g/dL (ref 6.0–8.3)
Total Bilirubin: 0.4 mg/dL (ref 0.2–1.2)

## 2016-12-07 LAB — SEDIMENTATION RATE: SED RATE: 9 mm/h (ref 0–30)

## 2016-12-07 LAB — VITAMIN B12: Vitamin B-12: 966 pg/mL — ABNORMAL HIGH (ref 211–911)

## 2016-12-07 LAB — HIGH SENSITIVITY CRP: CRP, High Sensitivity: 6.29 mg/L — ABNORMAL HIGH (ref 0.000–5.000)

## 2017-01-21 DIAGNOSIS — L821 Other seborrheic keratosis: Secondary | ICD-10-CM | POA: Diagnosis not present

## 2017-01-21 DIAGNOSIS — D1801 Hemangioma of skin and subcutaneous tissue: Secondary | ICD-10-CM | POA: Diagnosis not present

## 2017-01-21 DIAGNOSIS — D224 Melanocytic nevi of scalp and neck: Secondary | ICD-10-CM | POA: Diagnosis not present

## 2017-01-21 DIAGNOSIS — L570 Actinic keratosis: Secondary | ICD-10-CM | POA: Diagnosis not present

## 2017-01-21 DIAGNOSIS — L814 Other melanin hyperpigmentation: Secondary | ICD-10-CM | POA: Diagnosis not present

## 2017-01-21 DIAGNOSIS — L280 Lichen simplex chronicus: Secondary | ICD-10-CM | POA: Diagnosis not present

## 2017-03-08 DIAGNOSIS — J309 Allergic rhinitis, unspecified: Secondary | ICD-10-CM | POA: Diagnosis not present

## 2017-03-08 DIAGNOSIS — F419 Anxiety disorder, unspecified: Secondary | ICD-10-CM | POA: Diagnosis not present

## 2017-03-08 DIAGNOSIS — I10 Essential (primary) hypertension: Secondary | ICD-10-CM | POA: Diagnosis not present

## 2017-03-08 DIAGNOSIS — E7800 Pure hypercholesterolemia, unspecified: Secondary | ICD-10-CM | POA: Diagnosis not present

## 2017-03-08 DIAGNOSIS — K760 Fatty (change of) liver, not elsewhere classified: Secondary | ICD-10-CM | POA: Diagnosis not present

## 2017-03-08 DIAGNOSIS — E538 Deficiency of other specified B group vitamins: Secondary | ICD-10-CM | POA: Diagnosis not present

## 2017-07-01 DIAGNOSIS — R300 Dysuria: Secondary | ICD-10-CM | POA: Diagnosis not present

## 2017-07-01 DIAGNOSIS — J069 Acute upper respiratory infection, unspecified: Secondary | ICD-10-CM | POA: Diagnosis not present

## 2017-07-01 DIAGNOSIS — E2839 Other primary ovarian failure: Secondary | ICD-10-CM | POA: Diagnosis not present

## 2017-07-07 DIAGNOSIS — Z803 Family history of malignant neoplasm of breast: Secondary | ICD-10-CM | POA: Diagnosis not present

## 2017-07-07 DIAGNOSIS — M8589 Other specified disorders of bone density and structure, multiple sites: Secondary | ICD-10-CM | POA: Diagnosis not present

## 2017-07-07 DIAGNOSIS — Z1231 Encounter for screening mammogram for malignant neoplasm of breast: Secondary | ICD-10-CM | POA: Diagnosis not present

## 2017-08-03 DIAGNOSIS — R6889 Other general symptoms and signs: Secondary | ICD-10-CM | POA: Diagnosis not present

## 2017-08-03 DIAGNOSIS — F419 Anxiety disorder, unspecified: Secondary | ICD-10-CM | POA: Diagnosis not present

## 2017-08-17 DIAGNOSIS — H5203 Hypermetropia, bilateral: Secondary | ICD-10-CM | POA: Diagnosis not present

## 2017-08-17 DIAGNOSIS — H2513 Age-related nuclear cataract, bilateral: Secondary | ICD-10-CM | POA: Diagnosis not present

## 2017-08-17 DIAGNOSIS — H3500 Unspecified background retinopathy: Secondary | ICD-10-CM | POA: Diagnosis not present

## 2017-09-05 DIAGNOSIS — E7800 Pure hypercholesterolemia, unspecified: Secondary | ICD-10-CM | POA: Diagnosis not present

## 2017-09-05 DIAGNOSIS — K760 Fatty (change of) liver, not elsewhere classified: Secondary | ICD-10-CM | POA: Diagnosis not present

## 2017-09-05 DIAGNOSIS — I10 Essential (primary) hypertension: Secondary | ICD-10-CM | POA: Diagnosis not present

## 2017-09-05 DIAGNOSIS — F419 Anxiety disorder, unspecified: Secondary | ICD-10-CM | POA: Diagnosis not present

## 2017-09-22 DIAGNOSIS — H2511 Age-related nuclear cataract, right eye: Secondary | ICD-10-CM | POA: Diagnosis not present

## 2017-09-22 DIAGNOSIS — H25811 Combined forms of age-related cataract, right eye: Secondary | ICD-10-CM | POA: Diagnosis not present

## 2017-10-25 DIAGNOSIS — Z0000 Encounter for general adult medical examination without abnormal findings: Secondary | ICD-10-CM | POA: Diagnosis not present

## 2017-10-25 DIAGNOSIS — J0100 Acute maxillary sinusitis, unspecified: Secondary | ICD-10-CM | POA: Diagnosis not present

## 2017-10-27 DIAGNOSIS — H2512 Age-related nuclear cataract, left eye: Secondary | ICD-10-CM | POA: Diagnosis not present

## 2017-10-27 DIAGNOSIS — H25812 Combined forms of age-related cataract, left eye: Secondary | ICD-10-CM | POA: Diagnosis not present

## 2017-11-01 DIAGNOSIS — J069 Acute upper respiratory infection, unspecified: Secondary | ICD-10-CM | POA: Diagnosis not present

## 2017-12-01 DIAGNOSIS — R05 Cough: Secondary | ICD-10-CM | POA: Diagnosis not present

## 2017-12-01 DIAGNOSIS — R21 Rash and other nonspecific skin eruption: Secondary | ICD-10-CM | POA: Diagnosis not present

## 2018-01-31 DIAGNOSIS — R21 Rash and other nonspecific skin eruption: Secondary | ICD-10-CM | POA: Diagnosis not present

## 2018-03-02 DIAGNOSIS — E559 Vitamin D deficiency, unspecified: Secondary | ICD-10-CM | POA: Diagnosis not present

## 2018-03-02 DIAGNOSIS — F419 Anxiety disorder, unspecified: Secondary | ICD-10-CM | POA: Diagnosis not present

## 2018-03-02 DIAGNOSIS — E538 Deficiency of other specified B group vitamins: Secondary | ICD-10-CM | POA: Diagnosis not present

## 2018-03-02 DIAGNOSIS — M25551 Pain in right hip: Secondary | ICD-10-CM | POA: Diagnosis not present

## 2018-03-02 DIAGNOSIS — F439 Reaction to severe stress, unspecified: Secondary | ICD-10-CM | POA: Diagnosis not present

## 2018-03-02 DIAGNOSIS — E7800 Pure hypercholesterolemia, unspecified: Secondary | ICD-10-CM | POA: Diagnosis not present

## 2018-03-02 DIAGNOSIS — R079 Chest pain, unspecified: Secondary | ICD-10-CM | POA: Diagnosis not present

## 2018-03-02 DIAGNOSIS — I10 Essential (primary) hypertension: Secondary | ICD-10-CM | POA: Diagnosis not present

## 2018-03-02 DIAGNOSIS — R05 Cough: Secondary | ICD-10-CM | POA: Diagnosis not present

## 2018-03-02 DIAGNOSIS — R5383 Other fatigue: Secondary | ICD-10-CM | POA: Diagnosis not present

## 2018-03-08 DIAGNOSIS — H02403 Unspecified ptosis of bilateral eyelids: Secondary | ICD-10-CM | POA: Diagnosis not present

## 2018-03-10 DIAGNOSIS — D2261 Melanocytic nevi of right upper limb, including shoulder: Secondary | ICD-10-CM | POA: Diagnosis not present

## 2018-03-10 DIAGNOSIS — L820 Inflamed seborrheic keratosis: Secondary | ICD-10-CM | POA: Diagnosis not present

## 2018-03-10 DIAGNOSIS — L814 Other melanin hyperpigmentation: Secondary | ICD-10-CM | POA: Diagnosis not present

## 2018-03-10 DIAGNOSIS — L570 Actinic keratosis: Secondary | ICD-10-CM | POA: Diagnosis not present

## 2018-03-10 DIAGNOSIS — L821 Other seborrheic keratosis: Secondary | ICD-10-CM | POA: Diagnosis not present

## 2018-03-10 DIAGNOSIS — L720 Epidermal cyst: Secondary | ICD-10-CM | POA: Diagnosis not present

## 2018-03-10 DIAGNOSIS — D2262 Melanocytic nevi of left upper limb, including shoulder: Secondary | ICD-10-CM | POA: Diagnosis not present

## 2018-04-19 ENCOUNTER — Encounter: Payer: Self-pay | Admitting: Gastroenterology

## 2018-04-19 ENCOUNTER — Ambulatory Visit (INDEPENDENT_AMBULATORY_CARE_PROVIDER_SITE_OTHER): Payer: Medicare Other | Admitting: Gastroenterology

## 2018-04-19 VITALS — BP 130/68 | HR 64 | Ht 60.0 in | Wt 169.8 lb

## 2018-04-19 DIAGNOSIS — Z1211 Encounter for screening for malignant neoplasm of colon: Secondary | ICD-10-CM | POA: Diagnosis not present

## 2018-04-19 DIAGNOSIS — K588 Other irritable bowel syndrome: Secondary | ICD-10-CM

## 2018-04-19 DIAGNOSIS — R159 Full incontinence of feces: Secondary | ICD-10-CM | POA: Diagnosis not present

## 2018-04-19 DIAGNOSIS — Z1212 Encounter for screening for malignant neoplasm of rectum: Secondary | ICD-10-CM

## 2018-04-19 DIAGNOSIS — K760 Fatty (change of) liver, not elsewhere classified: Secondary | ICD-10-CM

## 2018-04-19 NOTE — Patient Instructions (Addendum)
Take benefiber 1 teaspoon three times a day with meals  Have your Primary Doctor fax your labs (Liver test) to Dr Silverio Decamp at [phone]   Kegel Exercises Kegel exercises help strengthen the muscles that support the rectum, vagina, small intestine, bladder, and uterus. Doing Kegel exercises can help:  Improve bladder and bowel control.  Improve sexual response.  Reduce problems and discomfort during pregnancy.  Kegel exercises involve squeezing your pelvic floor muscles, which are the same muscles you squeeze when you try to stop the flow of urine. The exercises can be done while sitting, standing, or lying down, but it is best to vary your position. Phase 1 exercises 1. Squeeze your pelvic floor muscles tight. You should feel a tight lift in your rectal area. If you are a female, you should also feel a tightness in your vaginal area. Keep your stomach, buttocks, and legs relaxed. 2. Hold the muscles tight for up to 10 seconds. 3. Relax your muscles. Repeat this exercise 50 times a day or as many times as told by your health care provider. Continue to do this exercise for at least 4-6 weeks or for as long as told by your health care provider. This information is not intended to replace advice given to you by your health care provider. Make sure you discuss any questions you have with your health care provider. Document Released: 05/17/2012 Document Revised: 01/24/2016 Document Reviewed: 04/20/2015 Elsevier Interactive Patient Education  [employer].

## 2018-04-19 NOTE — Progress Notes (Signed)
Katelyn Taylor    [id number]    25-Mar-1941  Primary Care Physician:Hepler, Truddie Coco  Referring Physician: Corine Shelter, [place], [place] [address]  Chief complaint:  Fecal incontinence  HPI: 77 yr F with complaints of bloating and fecal incontinence intermittent episodes for past 1 to 2 years. She has 1-2 bowel movement daily on most day but more irregular recently. She has small bowel movement everytime she urinatines, sometimes she passes only gas or liquid stool when she urinates. No incontinence when she walks or is sleeping. No blood or mucus in stool.  Denies diarrhea.  No loss of appetite or weight loss.  Colonoscopy March 18, 2009 by Dr. Olevia Perches good prep, internal hemorrhoids, mild nonspecific erythema in sigmoid colon biopsies were taken, showed benign colonic mucosa based on path report.  EGD March 18, 2009 by Dr. Olevia Perches showed esophagitis and gastritis.  Biopsies negative for Barrett's esophagus, H. pylori infection.  Outpatient Encounter Medications as of 04/19/2018  Medication Sig  . aspirin 81 MG chewable tablet Chew 81 mg by mouth daily.   . B Complex Vitamins (VITAMIN B COMPLEX PO) Take 3 capsules by mouth daily.  . calcium carbonate (TUMS EX) 750 MG chewable tablet Chew 1 tablet by mouth daily as needed for heartburn.  . Cholecalciferol (VITAMIN D PO) Take 1 tablet by mouth daily.   . clindamycin (CLINDAGEL) 1 % gel Apply 1 application topically 2 (two) times daily as needed (facial rash).   . diazepam (VALIUM) 10 MG tablet Take 1 tablet (10 mg total) by mouth as needed.  . fish oil-omega-3 fatty acids 1000 MG capsule Take 1 g by mouth daily.   . furosemide (LASIX) 40 MG tablet Take 60 mg by mouth daily.   Marland Kitchen ketoconazole (NIZORAL) 2 % cream Apply 1 application topically daily.   Marland Kitchen lisinopril (PRINIVIL,ZESTRIL) 5 MG tablet Take 5 mg by mouth daily.  . Multiple Vitamin (MULTIVITAMIN) tablet Take 2 tablets by mouth daily.  Marland Kitchen  VITAMIN E PO Take 1 tablet by mouth daily.    No facility-administered encounter medications on file as of 04/19/2018.     Allergies as of 04/19/2018 - Review Complete 04/19/2018  Allergen Reaction Noted  . Amoxicillin Other (See Comments)   . Cephalexin Other (See Comments) 01/28/2009  . Codeine Nausea And Vomiting 10/21/2010  . Nsaids Other (See Comments) 04/14/2013  . Penicillins Other (See Comments) 10/21/2010  . Sulfonamide derivatives Other (See Comments)     Past Medical History:  Diagnosis Date  . Anticoagulated   . Anxiety   . Arthritis   . Chronic gastritis   . Depression   . DVT (deep venous thrombosis) ([place])   . External hemorrhoid   . Fatty liver   . GERD (gastroesophageal reflux disease)   . Hiatal hernia   . Hx of migraines   . Hypertension   . Osteopenia 08/2014   T score -2.1 FRAX 13%/1.5%  . Severe dysplasia of cervix (CIN III)   . Shingles     Past Surgical History:  Procedure Laterality Date  . CATARACT EXTRACTION, BILATERAL    . DIAGNOSTIC LAPAROSCOPIC LIVER BIOPSY  03/16/2013  . KNEE ARTHROSCOPY Right   . LAPAROSCOPIC CHOLECYSTECTOMY  03/16/2013  . TMJ ARTHROPLASTY    . TUBAL LIGATION    . VAGINAL HYSTERECTOMY  1979   Partial for high-grade dysplasia and dysfunctional bleeding historically    Family History  Problem  Relation Age of Onset  . Melanoma Mother   . Stroke Mother   . Heart disease Mother   . Cancer Mother        melonoma  . Heart disease Father   . Breast cancer Sister 30  . Cancer Sister        breast  . Lung cancer Brother   . Cancer Brother        throat, lung  . Breast cancer Maternal Aunt        Age unknown  . Heart disease Sister   . Throat cancer Brother   . Ovarian cancer Unknown        aunt  . Colon cancer Neg Hx     Social History   Socioeconomic History  . Marital status: Married    Spouse name: Not on file  . Number of children: 3  . Years of education: Not on file  . Highest education level: Not on  file  Occupational History  . Occupation: Arboriculturist: RETIRED  . Occupation: caregiver  Social Needs  . Financial resource strain: Not on file  . Food insecurity:    Worry: Not on file    Inability: Not on file  . Transportation needs:    Medical: Not on file    Non-medical: Not on file  Tobacco Use  . Smoking status: Former Smoker    Types: Cigarettes  . Smokeless tobacco: Never Used  Substance and Sexual Activity  . Alcohol use: No  . Drug use: No  . Sexual activity: Never    Birth control/protection: Surgical    Comment: 1st encounter over age 63; 3 partners lifetime  Lifestyle  . Physical activity:    Days per week: Not on file    Minutes per session: Not on file  . Stress: Not on file  Relationships  . Social connections:    Talks on phone: Not on file    Gets together: Not on file    Attends religious service: Not on file    Active member of club or organization: Not on file    Attends meetings of clubs or organizations: Not on file    Relationship status: Not on file  . Intimate partner violence:    Fear of current or ex partner: Not on file    Emotionally abused: Not on file    Physically abused: Not on file    Forced sexual activity: Not on file  Other Topics Concern  . Not on file  Social History Narrative   Lives home with husband, son, grandson.  Children 3.       Review of systems: Review of Systems  Constitutional: Negative for fever and chills.  Positive for lack of energy, weight gain and decreased appetite HENT: Positive for sinus problem Eyes: Negative for blurred vision.  Respiratory: Positive for cough, negative for shortness of breath and wheezing.   Cardiovascular: Negative for chest pain and palpitations.  Gastrointestinal: as per HPI Genitourinary: Negative for dysuria, urgency, frequency and hematuria.  Musculoskeletal: Positive for myalgias, back pain and joint pain.  Skin: Negative for itching and rash.  Neurological:  Negative for dizziness, tremors, focal weakness, seizures and loss of consciousness.  Endo/Heme/Allergies: Positive for seasonal allergies.  Psychiatric/Behavioral: Negative for depression, suicidal ideas and hallucinations.  Positive for anxiety All other systems reviewed and are negative.   Physical Exam: Vitals:   04/19/18 1019  BP: 130/68  Pulse: 64   Body mass index is  33.16 kg/m. Gen:      No acute distress HEENT:  EOMI, sclera anicteric Neck:     No masses; no thyromegaly Lungs:    Clear to auscultation bilaterally; normal respiratory effort CV:         Regular rate and rhythm; no murmurs Abd:      + bowel sounds; soft, non-tender; no palpable masses, no distension Ext: Bilateral edema; adequate peripheral perfusion Skin:      Warm and dry; no rash Neuro: alert and oriented x 3 Psych: normal mood and affect Rectal exam: Decreased anal sphincter tone, no anal fissure or external hemorrhoids   Data Reviewed:  Reviewed labs, radiology imaging, old records and pertinent past GI work up   Assessment and Plan/Recommendations:  77 year old female with complaints of irregular bowel habits, bloating and fecal incontinence episodes when she is urinating Likely etiology weak internal anal sphincter and pelvic floor dysfunction for fecal incontinence Advised patient to start doing Keagle exercises, if continues to have persistent episodes of incontinence, will refer to pelvic floor physical therapy  Irregular bowel habits secondary to underlying irritable bowel syndrome Start Benefiber 1 teaspoon 3 times daily with meals Increase water intake to 60 ounces daily  Steatohepatitis: Stable Will request LFT and recent labs from PMD  Due for screening colonoscopy October 2020  25 minutes was spent face-to-face with the patient. Greater than 50% of the time used for counseling as well as treatment plan and follow-up. She had multiple questions which were answered to her  satisfaction  K. Denzil Magnuson , MD [phone]    CC: Corine Shelter, PA-C

## 2018-05-02 ENCOUNTER — Encounter: Payer: Self-pay | Admitting: Gastroenterology

## 2018-05-19 DIAGNOSIS — J069 Acute upper respiratory infection, unspecified: Secondary | ICD-10-CM | POA: Diagnosis not present

## 2018-05-19 DIAGNOSIS — R300 Dysuria: Secondary | ICD-10-CM | POA: Diagnosis not present

## 2018-06-11 DIAGNOSIS — R05 Cough: Secondary | ICD-10-CM | POA: Diagnosis not present

## 2018-06-11 DIAGNOSIS — J209 Acute bronchitis, unspecified: Secondary | ICD-10-CM | POA: Diagnosis not present

## 2018-06-14 DIAGNOSIS — R05 Cough: Secondary | ICD-10-CM | POA: Diagnosis not present

## 2018-06-14 DIAGNOSIS — R0602 Shortness of breath: Secondary | ICD-10-CM | POA: Diagnosis not present

## 2018-06-14 DIAGNOSIS — Z87891 Personal history of nicotine dependence: Secondary | ICD-10-CM | POA: Diagnosis not present

## 2018-06-14 DIAGNOSIS — Z882 Allergy status to sulfonamides status: Secondary | ICD-10-CM | POA: Diagnosis not present

## 2018-06-14 DIAGNOSIS — J1089 Influenza due to other identified influenza virus with other manifestations: Secondary | ICD-10-CM | POA: Diagnosis not present

## 2018-06-14 DIAGNOSIS — J111 Influenza due to unidentified influenza virus with other respiratory manifestations: Secondary | ICD-10-CM | POA: Diagnosis not present

## 2018-06-22 DIAGNOSIS — J069 Acute upper respiratory infection, unspecified: Secondary | ICD-10-CM | POA: Diagnosis not present

## 2018-06-22 DIAGNOSIS — Z09 Encounter for follow-up examination after completed treatment for conditions other than malignant neoplasm: Secondary | ICD-10-CM | POA: Diagnosis not present

## 2018-06-22 DIAGNOSIS — R05 Cough: Secondary | ICD-10-CM | POA: Diagnosis not present

## 2018-07-08 DIAGNOSIS — Z1231 Encounter for screening mammogram for malignant neoplasm of breast: Secondary | ICD-10-CM | POA: Diagnosis not present

## 2018-08-03 ENCOUNTER — Telehealth: Payer: Self-pay | Admitting: Gastroenterology

## 2018-08-03 NOTE — Telephone Encounter (Signed)
A user error has taken place: ERROR °

## 2018-08-30 DIAGNOSIS — F419 Anxiety disorder, unspecified: Secondary | ICD-10-CM | POA: Diagnosis not present

## 2018-08-30 DIAGNOSIS — J069 Acute upper respiratory infection, unspecified: Secondary | ICD-10-CM | POA: Diagnosis not present

## 2018-08-30 DIAGNOSIS — H65199 Other acute nonsuppurative otitis media, unspecified ear: Secondary | ICD-10-CM | POA: Diagnosis not present

## 2018-12-06 DIAGNOSIS — H52201 Unspecified astigmatism, right eye: Secondary | ICD-10-CM | POA: Diagnosis not present

## 2018-12-06 DIAGNOSIS — H26493 Other secondary cataract, bilateral: Secondary | ICD-10-CM | POA: Diagnosis not present

## 2018-12-06 DIAGNOSIS — H02403 Unspecified ptosis of bilateral eyelids: Secondary | ICD-10-CM | POA: Diagnosis not present

## 2019-03-01 DIAGNOSIS — H02423 Myogenic ptosis of bilateral eyelids: Secondary | ICD-10-CM | POA: Diagnosis not present

## 2019-03-01 DIAGNOSIS — H02831 Dermatochalasis of right upper eyelid: Secondary | ICD-10-CM | POA: Diagnosis not present

## 2019-03-01 DIAGNOSIS — H53483 Generalized contraction of visual field, bilateral: Secondary | ICD-10-CM | POA: Diagnosis not present

## 2019-03-01 DIAGNOSIS — H57813 Brow ptosis, bilateral: Secondary | ICD-10-CM | POA: Diagnosis not present

## 2019-03-01 DIAGNOSIS — H02832 Dermatochalasis of right lower eyelid: Secondary | ICD-10-CM | POA: Diagnosis not present

## 2019-03-01 DIAGNOSIS — H0279 Other degenerative disorders of eyelid and periocular area: Secondary | ICD-10-CM | POA: Diagnosis not present

## 2019-03-01 DIAGNOSIS — H02835 Dermatochalasis of left lower eyelid: Secondary | ICD-10-CM | POA: Diagnosis not present

## 2019-03-01 DIAGNOSIS — H02413 Mechanical ptosis of bilateral eyelids: Secondary | ICD-10-CM | POA: Diagnosis not present

## 2019-03-01 DIAGNOSIS — H02834 Dermatochalasis of left upper eyelid: Secondary | ICD-10-CM | POA: Diagnosis not present

## 2019-03-08 DIAGNOSIS — F419 Anxiety disorder, unspecified: Secondary | ICD-10-CM | POA: Diagnosis not present

## 2019-03-08 DIAGNOSIS — I10 Essential (primary) hypertension: Secondary | ICD-10-CM | POA: Diagnosis not present

## 2019-03-08 DIAGNOSIS — E559 Vitamin D deficiency, unspecified: Secondary | ICD-10-CM | POA: Diagnosis not present

## 2019-03-08 DIAGNOSIS — E7800 Pure hypercholesterolemia, unspecified: Secondary | ICD-10-CM | POA: Diagnosis not present

## 2019-03-13 DIAGNOSIS — H53481 Generalized contraction of visual field, right eye: Secondary | ICD-10-CM | POA: Diagnosis not present

## 2019-03-13 DIAGNOSIS — H53483 Generalized contraction of visual field, bilateral: Secondary | ICD-10-CM | POA: Diagnosis not present

## 2019-03-13 DIAGNOSIS — H53482 Generalized contraction of visual field, left eye: Secondary | ICD-10-CM | POA: Diagnosis not present

## 2019-03-22 ENCOUNTER — Encounter: Payer: Self-pay | Admitting: Gynecology

## 2019-03-23 DIAGNOSIS — D485 Neoplasm of uncertain behavior of skin: Secondary | ICD-10-CM | POA: Diagnosis not present

## 2019-03-23 DIAGNOSIS — D2261 Melanocytic nevi of right upper limb, including shoulder: Secondary | ICD-10-CM | POA: Diagnosis not present

## 2019-03-23 DIAGNOSIS — B079 Viral wart, unspecified: Secondary | ICD-10-CM | POA: Diagnosis not present

## 2019-03-23 DIAGNOSIS — L814 Other melanin hyperpigmentation: Secondary | ICD-10-CM | POA: Diagnosis not present

## 2019-03-23 DIAGNOSIS — L720 Epidermal cyst: Secondary | ICD-10-CM | POA: Diagnosis not present

## 2019-03-23 DIAGNOSIS — L570 Actinic keratosis: Secondary | ICD-10-CM | POA: Diagnosis not present

## 2019-03-23 DIAGNOSIS — L820 Inflamed seborrheic keratosis: Secondary | ICD-10-CM | POA: Diagnosis not present

## 2019-03-23 DIAGNOSIS — L821 Other seborrheic keratosis: Secondary | ICD-10-CM | POA: Diagnosis not present

## 2019-03-27 ENCOUNTER — Other Ambulatory Visit: Payer: Self-pay | Admitting: Physician Assistant

## 2019-03-27 DIAGNOSIS — R0789 Other chest pain: Secondary | ICD-10-CM

## 2019-04-30 DIAGNOSIS — H53453 Other localized visual field defect, bilateral: Secondary | ICD-10-CM | POA: Diagnosis not present

## 2019-04-30 DIAGNOSIS — H02831 Dermatochalasis of right upper eyelid: Secondary | ICD-10-CM | POA: Diagnosis not present

## 2019-04-30 DIAGNOSIS — H02832 Dermatochalasis of right lower eyelid: Secondary | ICD-10-CM | POA: Diagnosis not present

## 2019-04-30 DIAGNOSIS — H02413 Mechanical ptosis of bilateral eyelids: Secondary | ICD-10-CM | POA: Diagnosis not present

## 2019-04-30 DIAGNOSIS — H02423 Myogenic ptosis of bilateral eyelids: Secondary | ICD-10-CM | POA: Diagnosis not present

## 2019-04-30 DIAGNOSIS — H53483 Generalized contraction of visual field, bilateral: Secondary | ICD-10-CM | POA: Diagnosis not present

## 2019-04-30 DIAGNOSIS — H02834 Dermatochalasis of left upper eyelid: Secondary | ICD-10-CM | POA: Diagnosis not present

## 2019-04-30 DIAGNOSIS — H0279 Other degenerative disorders of eyelid and periocular area: Secondary | ICD-10-CM | POA: Diagnosis not present

## 2019-04-30 DIAGNOSIS — H57813 Brow ptosis, bilateral: Secondary | ICD-10-CM | POA: Diagnosis not present

## 2019-04-30 DIAGNOSIS — H02835 Dermatochalasis of left lower eyelid: Secondary | ICD-10-CM | POA: Diagnosis not present

## 2019-05-16 ENCOUNTER — Other Ambulatory Visit: Payer: Self-pay

## 2019-06-18 ENCOUNTER — Encounter: Payer: Self-pay | Admitting: Gastroenterology

## 2019-07-06 ENCOUNTER — Telehealth: Payer: Self-pay | Admitting: *Deleted

## 2019-07-06 NOTE — Telephone Encounter (Signed)
Called patient and she wants to wait till after 2022-09-16, Her husband passed away last year and has been through an ordeal   Will put in a reminder in to call patient after 2022-09-16

## 2019-08-16 DIAGNOSIS — Z0000 Encounter for general adult medical examination without abnormal findings: Secondary | ICD-10-CM | POA: Diagnosis not present

## 2019-08-16 DIAGNOSIS — F419 Anxiety disorder, unspecified: Secondary | ICD-10-CM | POA: Diagnosis not present

## 2019-08-16 DIAGNOSIS — I10 Essential (primary) hypertension: Secondary | ICD-10-CM | POA: Diagnosis not present

## 2019-08-16 DIAGNOSIS — K589 Irritable bowel syndrome without diarrhea: Secondary | ICD-10-CM | POA: Diagnosis not present

## 2019-08-16 DIAGNOSIS — F439 Reaction to severe stress, unspecified: Secondary | ICD-10-CM | POA: Diagnosis not present

## 2019-08-16 DIAGNOSIS — E7800 Pure hypercholesterolemia, unspecified: Secondary | ICD-10-CM | POA: Diagnosis not present

## 2019-08-16 DIAGNOSIS — K760 Fatty (change of) liver, not elsewhere classified: Secondary | ICD-10-CM | POA: Diagnosis not present

## 2019-08-22 DIAGNOSIS — M8588 Other specified disorders of bone density and structure, other site: Secondary | ICD-10-CM | POA: Diagnosis not present

## 2019-08-22 DIAGNOSIS — Z1231 Encounter for screening mammogram for malignant neoplasm of breast: Secondary | ICD-10-CM | POA: Diagnosis not present

## 2019-09-07 DIAGNOSIS — M545 Low back pain: Secondary | ICD-10-CM | POA: Diagnosis not present

## 2019-09-07 DIAGNOSIS — R197 Diarrhea, unspecified: Secondary | ICD-10-CM | POA: Diagnosis not present

## 2019-09-13 ENCOUNTER — Other Ambulatory Visit: Payer: Self-pay

## 2019-10-26 DIAGNOSIS — M25512 Pain in left shoulder: Secondary | ICD-10-CM | POA: Diagnosis not present

## 2019-10-26 DIAGNOSIS — H60502 Unspecified acute noninfective otitis externa, left ear: Secondary | ICD-10-CM | POA: Diagnosis not present

## 2019-10-29 ENCOUNTER — Encounter: Payer: Self-pay | Admitting: Gastroenterology

## 2019-10-29 ENCOUNTER — Ambulatory Visit (INDEPENDENT_AMBULATORY_CARE_PROVIDER_SITE_OTHER): Payer: Medicare Other | Admitting: Gastroenterology

## 2019-10-29 DIAGNOSIS — R159 Full incontinence of feces: Secondary | ICD-10-CM | POA: Diagnosis not present

## 2019-10-29 DIAGNOSIS — R1013 Epigastric pain: Secondary | ICD-10-CM | POA: Diagnosis not present

## 2019-10-29 DIAGNOSIS — Z01818 Encounter for other preprocedural examination: Secondary | ICD-10-CM | POA: Diagnosis not present

## 2019-10-29 DIAGNOSIS — Z1211 Encounter for screening for malignant neoplasm of colon: Secondary | ICD-10-CM

## 2019-10-29 DIAGNOSIS — K7581 Nonalcoholic steatohepatitis (NASH): Secondary | ICD-10-CM

## 2019-10-29 DIAGNOSIS — K219 Gastro-esophageal reflux disease without esophagitis: Secondary | ICD-10-CM

## 2019-10-29 MED ORDER — PANTOPRAZOLE SODIUM 40 MG PO TBEC: 40.0000 mg | DELAYED_RELEASE_TABLET | Freq: Every day | ORAL | 3 refills | Status: DC | PRN

## 2019-10-29 MED ORDER — SUPREP BOWEL PREP KIT 17.5-3.13-1.6 GM/177ML PO SOLN: 1.0000 | ORAL | 0 refills | Status: DC

## 2019-10-29 NOTE — Progress Notes (Signed)
Katelyn Taylor    [id number]    1941/04/11  Primary Care Physician:Aura Dials, MD  Referring Physician: Corine Shelter, PA-C [address] [place],  [place] [address]   Chief complaint: Fecal incontinence HPI:  79 year old female here for follow-up visit for fecal incontinence, bloating and diarrhea.  Last office visit November 2019  Fecal incontinence episodes mostly in the morning, has more loose BM in the morning.  On average has 1-2 BM per day  Benefiber didn't help hence she stopped taking it  She was taking Omeprazole before but she stopped because of potential side effects.  She has heartburn and regurgitation.  History of fatty liver and steatohepatitis. Last labs at [facility] in March 2021. Stable per patient, not available to review during this visit  S/p cholecystectomy  Colonoscopy March 18, 2009 by Dr. Olevia Perches good prep, internal hemorrhoids, mild nonspecific erythema in sigmoid colon biopsies were taken, showed benign colonic mucosa based on path report.  EGD March 18, 2009 by Dr. Olevia Perches showed esophagitis and gastritis.  Biopsies negative for Barrett's esophagus, H. pylori infection.  Outpatient Encounter Medications as of 10/29/2019  Medication Sig  . aspirin 81 MG chewable tablet Chew 81 mg by mouth daily.   . B Complex Vitamins (VITAMIN B COMPLEX PO) Take 3 capsules by mouth daily.  . calcium carbonate (TUMS EX) 750 MG chewable tablet Chew 1 tablet by mouth daily as needed for heartburn.  . Cholecalciferol (VITAMIN D PO) Take 1 tablet by mouth daily.   . clindamycin (CLINDAGEL) 1 % gel Apply 1 application topically 2 (two) times daily as needed (facial rash).   . diazepam (VALIUM) 10 MG tablet Take 1 tablet (10 mg total) by mouth as needed.  . fish oil-omega-3 fatty acids 1000 MG capsule Take 1 g by mouth daily.   . furosemide (LASIX) 40 MG tablet Take 60 mg by mouth daily.   Marland Kitchen ketoconazole (NIZORAL) 2 % cream Apply 1 application topically  daily.   Marland Kitchen lisinopril (PRINIVIL,ZESTRIL) 5 MG tablet Take 5 mg by mouth daily.  . Multiple Vitamin (MULTIVITAMIN) tablet Take 2 tablets by mouth daily.  Marland Kitchen VITAMIN E PO Take 1 tablet by mouth daily.    No facility-administered encounter medications on file as of 10/29/2019.    Allergies as of 10/29/2019 - Review Complete 05/02/2018  Allergen Reaction Noted  . Amoxicillin Other (See Comments)   . Cephalexin Other (See Comments) 01/28/2009  . Codeine Nausea And Vomiting 10/21/2010  . Nsaids Other (See Comments) 04/14/2013  . Penicillins Other (See Comments) 10/21/2010  . Sulfonamide derivatives Other (See Comments)     Past Medical History:  Diagnosis Date  . Anticoagulated   . Anxiety   . Arthritis   . Chronic gastritis   . Depression   . DVT (deep venous thrombosis) ([place])   . External hemorrhoid   . Fatty liver   . GERD (gastroesophageal reflux disease)   . Hiatal hernia   . Hx of migraines   . Hypertension   . Osteopenia 08/2014   T score -2.1 FRAX 13%/1.5%  . Severe dysplasia of cervix (CIN III)   . Shingles     Past Surgical History:  Procedure Laterality Date  . CATARACT EXTRACTION, BILATERAL    . DIAGNOSTIC LAPAROSCOPIC LIVER BIOPSY  03/16/2013  . KNEE ARTHROSCOPY Right   . LAPAROSCOPIC CHOLECYSTECTOMY  03/16/2013  . TMJ ARTHROPLASTY    . TUBAL LIGATION    .  VAGINAL HYSTERECTOMY  1979   Partial for high-grade dysplasia and dysfunctional bleeding historically    Family History  Problem Relation Age of Onset  . Melanoma Mother   . Stroke Mother   . Heart disease Mother   . Cancer Mother        melonoma  . Heart disease Father   . Breast cancer Sister 25  . Cancer Sister        breast  . Lung cancer Brother   . Cancer Brother        throat, lung  . Breast cancer Maternal Aunt        Age unknown  . Heart disease Sister   . Throat cancer Brother   . Ovarian cancer Other        aunt  . Colon cancer Neg Hx     Social History   Socioeconomic History    . Marital status: Married    Spouse name: Not on file  . Number of children: 3  . Years of education: Not on file  . Highest education level: Not on file  Occupational History  . Occupation: Arboriculturist: RETIRED  . Occupation: caregiver  Tobacco Use  . Smoking status: Former Smoker    Types: Cigarettes  . Smokeless tobacco: Never Used  Substance and Sexual Activity  . Alcohol use: No  . Drug use: No  . Sexual activity: Never    Birth control/protection: Surgical    Comment: 1st encounter over age 23; 3 partners lifetime  Other Topics Concern  . Not on file  Social History Narrative   Lives home with husband, son, grandson.  Children 3.    Social Determinants of Health   Financial Resource Strain:   . Difficulty of Paying Living Expenses:   Food Insecurity:   . Worried About Charity fundraiser in the Last Year:   . Arboriculturist in the Last Year:   Transportation Needs:   . Film/video editor (Medical):   Marland Kitchen Lack of Transportation (Non-Medical):   Physical Activity:   . Days of Exercise per Week:   . Minutes of Exercise per Session:   Stress:   . Feeling of Stress :   Social Connections:   . Frequency of Communication with Friends and Family:   . Frequency of Social Gatherings with Friends and Family:   . Attends Religious Services:   . Active Member of Clubs or Organizations:   . Attends Archivist Meetings:   Marland Kitchen Marital Status:   Intimate Partner Violence:   . Fear of Current or Ex-Partner:   . Emotionally Abused:   Marland Kitchen Physically Abused:   . Sexually Abused:       Review of systems: All other review of systems negative except as mentioned in the HPI.   Physical Exam: Vitals:   10/29/19 0819  BP: 120/70  Pulse: 68  Temp: (!) 97 F (36.1 C)   Body mass index is 34.25 kg/m. Gen:      No acute distress Neuro: alert and oriented x 3 Psych: normal mood and affect  Data Reviewed:  Reviewed labs, radiology imaging, old  records and pertinent past GI work up   Assessment and Plan/Recommendations:  79 year old very pleasant female here for follow-up visit with complaints of heartburn, regurgitation abdominal bloating, chronic irritable bowel syndrome predominant diarrhea and intermittent fecal incontinence  She has history of chronic GERD, discontinued PPI due to potential side effects.  She has  recurrent symptoms History of esophagitis and gastritis We will schedule for EGD for further evaluation of recurrent reflux symptoms and epigastric abdominal pain.  Will need to exclude erosive esophagitis, hiatal hernia, gastritis and peptic ulcer disease Patient agreed to start Protonix 40 mg daily as needed Discussed antireflux measures  Due for colorectal cancer screening, will schedule EGD along with colonoscopy  Fecal incontinence and bowel habits improved with dietary changes Continue high-fiber diet Avoid lactose  History of fatty liver disease/nonalcoholic steatohepatitis Will obtain labs from [facility] to review  Return in 3 months or sooner if needed  The risks and benefits as well as alternatives of endoscopic procedure(s) have been discussed and reviewed. All questions answered. The patient agrees to proceed.   The patient was provided an opportunity to ask questions and all were answered. The patient agreed with the plan and demonstrated an understanding of the instructions.  Damaris Hippo , MD    CC: Corine Shelter, PA-C

## 2019-10-29 NOTE — Patient Instructions (Addendum)
If you are age 78 or older, your body mass index should be between 23-30. Your Body mass index is 34.83 kg/m. If this is out of the aforementioned range listed, please consider follow up with your Primary Care Provider.  If you are age 6 or younger, your body mass index should be between 19-25. Your Body mass index is 34.83 kg/m. If this is out of the aformentioned range listed, please consider follow up with your Primary Care Provider.   You have been scheduled for an endoscopy and colonoscopy. Please follow the written instructions given to you at your visit today. Please pick up your prep supplies at the pharmacy within the next 1-3 days. If you use inhalers (even only as needed), please bring them with you on the day of your procedure.  Due to recent changes in healthcare laws, you may see the results of your imaging and laboratory studies on MyChart before your provider has had a chance to review them.  We understand that in some cases there may be results that are confusing or concerning to you. Not all laboratory results come back in the same time frame and the provider may be waiting for multiple results in order to interpret others.  Please give [place] 48 hours in order for your provider to thoroughly review all the results before contacting the office for clarification of your results.   We have sent the following medications to your pharmacy for you to pick up at your convenience:  START: Protonix 40mg  one tablet daily as needed   Patient advised to avoid spicy, acidic, citrus, chocolate, mints, fruit and fruit juices.  Limit the intake of caffeine, alcohol and Soda.  Don't exercise too soon after eating.  Don't lie down within 3-4 hours of eating.  Elevate the head of your bed.  We will obtain your recent lab work from [facility].  You will be seen in follow up in the office in 3 months (August 2021).  I appreciate the opportunity to care for you. Thank you for choosing me and  [facility],  Dr. Harl Bowie

## 2019-11-08 DIAGNOSIS — I788 Other diseases of capillaries: Secondary | ICD-10-CM | POA: Diagnosis not present

## 2019-11-08 DIAGNOSIS — L820 Inflamed seborrheic keratosis: Secondary | ICD-10-CM | POA: Diagnosis not present

## 2019-11-08 DIAGNOSIS — I8391 Asymptomatic varicose veins of right lower extremity: Secondary | ICD-10-CM | POA: Diagnosis not present

## 2019-11-08 DIAGNOSIS — L814 Other melanin hyperpigmentation: Secondary | ICD-10-CM | POA: Diagnosis not present

## 2019-11-08 DIAGNOSIS — L570 Actinic keratosis: Secondary | ICD-10-CM | POA: Diagnosis not present

## 2019-11-13 DIAGNOSIS — J0100 Acute maxillary sinusitis, unspecified: Secondary | ICD-10-CM | POA: Diagnosis not present

## 2019-11-13 DIAGNOSIS — M545 Low back pain: Secondary | ICD-10-CM | POA: Diagnosis not present

## 2019-11-30 ENCOUNTER — Telehealth: Payer: Self-pay | Admitting: Gastroenterology

## 2019-11-30 NOTE — Telephone Encounter (Signed)
Dr. Silverio Decamp, this patient rescheduled her 12/05/19 EGD and colon to 01/04/20.  She has a conflict in appt with cardiologist.

## 2019-12-04 NOTE — Telephone Encounter (Signed)
Please make sure she is aware that she will be charge for late cancellation fee as per [facility] policy. Thanks

## 2019-12-05 ENCOUNTER — Other Ambulatory Visit: Payer: Self-pay

## 2019-12-05 ENCOUNTER — Encounter: Payer: Self-pay | Admitting: Cardiology

## 2019-12-05 ENCOUNTER — Ambulatory Visit (INDEPENDENT_AMBULATORY_CARE_PROVIDER_SITE_OTHER): Payer: Medicare Other | Admitting: Cardiology

## 2019-12-05 ENCOUNTER — Encounter: Payer: Medicare Other | Admitting: Gastroenterology

## 2019-12-05 VITALS — BP 124/68 | HR 77 | Ht 60.0 in | Wt 175.4 lb

## 2019-12-05 DIAGNOSIS — I10 Essential (primary) hypertension: Secondary | ICD-10-CM

## 2019-12-05 DIAGNOSIS — Z8249 Family history of ischemic heart disease and other diseases of the circulatory system: Secondary | ICD-10-CM

## 2019-12-05 DIAGNOSIS — R931 Abnormal findings on diagnostic imaging of heart and coronary circulation: Secondary | ICD-10-CM

## 2019-12-05 DIAGNOSIS — R072 Precordial pain: Secondary | ICD-10-CM

## 2019-12-05 MED ORDER — METOPROLOL TARTRATE 50 MG PO TABS
50.0000 mg | ORAL_TABLET | Freq: Once | ORAL | 0 refills | Status: DC
Start: 2019-12-05 — End: 2020-03-14

## 2019-12-05 NOTE — Patient Instructions (Signed)
Medication Instructions:   Your physician recommends that you continue on your current medications as directed. Please refer to the Current Medication list given to you today.  *If you need a refill on your cardiac medications before your next appointment, please call your pharmacy*  Testing/Procedures:  Your physician has requested that you have an echocardiogram. Echocardiography is a painless test that uses sound waves to create images of your heart. It provides your doctor with information about the size and shape of your heart and how well your heart's chambers and valves are working. This procedure takes approximately one hour. There are no restrictions for this procedure.  Your cardiac CT will be scheduled at one of the below locations:   [facility] [address] [place], [place] [address] [phone]  If scheduled at [facility], please arrive at the [facility] main entrance of [facility] 30 minutes prior to test start time. Proceed to the [facility] Radiology Department (first floor) to check-in and test prep.  Please follow these instructions carefully (unless otherwise directed):  On the Night Before the Test: . Be sure to Drink plenty of water. . Do not consume any caffeinated/decaffeinated beverages or chocolate 12 hours prior to your test. . Do not take any antihistamines 12 hours prior to your test.  On the Day of the Test: . Drink plenty of water. Do not drink any water within one hour of the test. . Do not eat any food 4 hours prior to the test. . You may take your regular medications prior to the test.  . Take metoprolol 50 mg by mouth (Lopressor) two hours prior to test. . HOLD Furosemide morning of the test. . FEMALES- please wear underwire-free bra if available       After the Test: . Drink plenty of water. . After receiving IV contrast, you may experience a mild flushed feeling. This is normal. . On occasion, you may  experience a mild rash up to 24 hours after the test. This is not dangerous. If this occurs, you can take Benadryl 25 mg and increase your fluid intake. . If you experience trouble breathing, this can be serious. If it is severe call 911 IMMEDIATELY. If it is mild, please call our office.  Once we have confirmed authorization from your insurance company, we will call you to set up a date and time for your test.   For non-scheduling related questions, please contact the cardiac imaging nurse navigator should you have any questions/concerns: Marchia Bond, Cardiac Imaging Nurse Navigator Burley Saver, Interim Cardiac Imaging Nurse [facility] Direct Office Dial: [phone]   For scheduling needs, including cancellations and rescheduling, please call [phone].    Follow-Up:  2 MONTHS IN THE OFFICE WITH DR. Meda Coffee OR AN APP

## 2019-12-05 NOTE — Progress Notes (Signed)
Cardiology Office Note:    Date:  12/05/2019   ID:  Katelyn Taylor, DOB [birth year], MRN [id number]  PCP:  Aura Dials, MD  [facility] Cardiologist:  Ena Dawley, MD  [facility] Electrophysiologist:  None   Referring MD: Aura Dials, MD   Chief complaint: Chest pain, dizziness   History of Present Illness:    Katelyn Taylor is a 79 y.o. female with a hx of hypertension, hyperlipidemia with no prior cardiac history, who is coming for complain of chest pain.  Her husband Katelyn Taylor was our patient but sadly passed away about a year ago from leukemia.  The patient is very tearful, she is dealing with a lot of depression after losing her husband, she uses diazepam for anxiety.  She has been experiencing chest pain on exertion as well as dyspnea on exertion.  She has family history of premature coronary artery disease, her father died of myocardial infarction in his 57s.  He was a smoker.  The patient has never smoked.  She has not been also experiencing dizziness consistent with orthostatic hypotension and he she had a fall last week when she got up and walked to the bathroom at night.  She has intermittent lower extremity edema that she controls with Lasix.  Past Medical History:  Diagnosis Date  . Allergic rhinitis   . Anticoagulated   . Anxiety   . Arthritis   . Back pain   . Cataracts, bilateral   . Chronic gastritis   . Depression   . DVT (deep venous thrombosis) ([place])   . Estrogen deficiency   . External hemorrhoid   . Fatty liver   . GERD (gastroesophageal reflux disease)   . Hiatal hernia   . Hx of migraines   . Hypercholesterolemia   . Hypertension   . IBS (irritable bowel syndrome)   . Neck pain   . Osteopenia 08/2014   T score -2.1 FRAX 13%/1.5%  . Panic attacks   . Severe dysplasia of cervix (CIN III)   . Shingles   . Stress   . Vitamin D deficiency     Past Surgical History:  Procedure Laterality Date  . CATARACT EXTRACTION, BILATERAL    .  DIAGNOSTIC LAPAROSCOPIC LIVER BIOPSY  03/16/2013  . KNEE ARTHROSCOPY Right   . LAPAROSCOPIC CHOLECYSTECTOMY  03/16/2013  . TMJ ARTHROPLASTY    . TUBAL LIGATION    . VAGINAL HYSTERECTOMY  1979   Partial for high-grade dysplasia and dysfunctional bleeding historically    Current Medications: Current Meds  Medication Sig  . aspirin 81 MG chewable tablet Chew 81 mg by mouth daily.   . calcium carbonate (TUMS EX) 750 MG chewable tablet Chew 1 tablet by mouth daily as needed for heartburn.  . cetirizine (ZYRTEC) 10 MG tablet Take 10 mg by mouth daily.  . Cholecalciferol (VITAMIN D PO) Take 1 tablet by mouth daily.   . CYANOCOBALAMIN PO Take 1 tablet by mouth daily.  . diazepam (VALIUM) 10 MG tablet Take 1 tablet (10 mg total) by mouth as needed.  . fish oil-omega-3 fatty acids 1000 MG capsule Take 1 g by mouth daily.   . furosemide (LASIX) 40 MG tablet Take 40 mg by mouth daily.   Marland Kitchen lisinopril (PRINIVIL,ZESTRIL) 5 MG tablet Take 5 mg by mouth daily.  . Multiple Vitamins-Minerals (WOMENS MULTIVITAMIN PO) Take by mouth.  . pantoprazole (PROTONIX) 40 MG tablet Take 1 tablet (40 mg total) by mouth daily as needed.  . sodium chloride (  OCEAN) 0.65 % SOLN nasal spray Place 2 sprays into both nostrils as needed for congestion.  . triamcinolone cream (KENALOG) 0.1 % Apply 1 application topically 2 (two) times daily.  Marland Kitchen triamcinolone cream (KENALOG) 0.5 % Apply 1 application topically 2 (two) times daily.  Marland Kitchen VITAMIN E PO Take 1 tablet by mouth daily.      Allergies:   Amoxicillin, Cephalexin, Codeine, Nsaids, Penicillins, and Sulfonamide derivatives   Social History   Socioeconomic History  . Marital status: Married    Spouse name: Not on file  . Number of children: 3  . Years of education: Not on file  . Highest education level: Not on file  Occupational History  . Occupation: Arboriculturist: RETIRED  . Occupation: caregiver  Tobacco Use  . Smoking status: Former Smoker    Types:  Cigarettes  . Smokeless tobacco: Never Used  Substance and Sexual Activity  . Alcohol use: No  . Drug use: No  . Sexual activity: Never    Birth control/protection: Surgical    Comment: 1st encounter over age 16; 3 partners lifetime  Other Topics Concern  . Not on file  Social History Narrative   Lives home with husband, son, grandson.  Children 3.    Social Determinants of Health   Financial Resource Strain:   . Difficulty of Paying Living Expenses:   Food Insecurity:   . Worried About Charity fundraiser in the Last Year:   . Arboriculturist in the Last Year:   Transportation Needs:   . Film/video editor (Medical):   Marland Kitchen Lack of Transportation (Non-Medical):   Physical Activity:   . Days of Exercise per Week:   . Minutes of Exercise per Session:   Stress:   . Feeling of Stress :   Social Connections:   . Frequency of Communication with Friends and Family:   . Frequency of Social Gatherings with Friends and Family:   . Attends Religious Services:   . Active Member of Clubs or Organizations:   . Attends Archivist Meetings:   Marland Kitchen Marital Status:      Family History: The patient's family history includes Breast cancer in her maternal aunt; Breast cancer (age of onset: 35) in her sister; Cancer in her brother, mother, and sister; Heart disease in her father, mother, and sister; Lung cancer in her brother; Melanoma in her mother; Ovarian cancer in an other family member; Stroke in her mother; Throat cancer in her brother. There is no history of Colon cancer.  ROS:   Please see the history of present illness.    All other systems reviewed and are negative.  EKGs/Labs/Other Studies Reviewed:    The following studies were reviewed today:  EKG:  EKG is ordered today.  The ekg ordered today demonstrates normal sinus rhythm, normal EKG, no prior EKG available for comparison.  Recent Labs: No results found for requested labs within last 8760 hours.  Recent Lipid  Panel    Component Value Date/Time   CHOL 135 02/22/2012 1051   TRIG 78 02/22/2012 1051   HDL 57 02/22/2012 1051   CHOLHDL 2.4 02/22/2012 1051   VLDL 16 02/22/2012 1051   LDLCALC 62 02/22/2012 1051    Physical Exam:    VS:  BP 124/68   Pulse 77   Ht 5' (1.524 m)   Wt 175 lb 6.4 oz (79.6 kg)   SpO2 94%   BMI 34.26 kg/m  Wt Readings from Last 3 Encounters:  12/05/19 175 lb 6.4 oz (79.6 kg)  10/29/19 175 lb 6 oz (79.5 kg)  04/19/18 169 lb 12.8 oz (77 kg)     GEN:  Well nourished, well developed in no acute distress HEENT: Normal NECK: No JVD; No carotid bruits LYMPHATICS: No lymphadenopathy CARDIAC: RRR, no murmurs, rubs, gallops RESPIRATORY:  Clear to auscultation without rales, wheezing or rhonchi  ABDOMEN: Soft, non-tender, non-distended MUSCULOSKELETAL:  No edema; No deformity  SKIN: Warm and dry NEUROLOGIC:  Alert and oriented x 3 PSYCHIATRIC:  Normal affect   ASSESSMENT:    1. Essential hypertension   2. Precordial pain   3. Family history of early CAD   70. Abnormal findings on diagnostic imaging of heart and coronary circulation     PLAN:    In order of problems listed above:  1. Chest pain -sounds typical and she has family history of premature coronary artery disease in her father will died in his 53s.  We will obtain coronary CTA to further evaluate. 2. Dizziness, seems like orthostatic hypotension, previously her lisinopril was discontinued but she became hypertensive.  We will obtain echocardiogram to further evaluate.  She is advised to sit on the side of the bed after prolonged horizontal position.   Medication Adjustments/Labs and Tests Ordered: Current medicines are reviewed at length with the patient today.  Concerns regarding medicines are outlined above.  Orders Placed This Encounter  Procedures  . CT CORONARY MORPH W/CTA COR W/SCORE W/CA W/CM &/OR WO/CM  . CT CORONARY FRACTIONAL FLOW RESERVE DATA PREP  . CT CORONARY FRACTIONAL FLOW  RESERVE FLUID ANALYSIS  . Basic metabolic panel  . EKG 12-Lead  . ECHOCARDIOGRAM COMPLETE   Meds ordered this encounter  Medications  . metoprolol tartrate (LOPRESSOR) 50 MG tablet    Sig: Take 1 tablet (50 mg total) by mouth once for 1 dose. Take 2 hours prior to your Coronary CT.    Dispense:  1 tablet    Refill:  0    Patient Instructions  Medication Instructions:   Your physician recommends that you continue on your current medications as directed. Please refer to the Current Medication list given to you today.  *If you need a refill on your cardiac medications before your next appointment, please call your pharmacy*  Testing/Procedures:  Your physician has requested that you have an echocardiogram. Echocardiography is a painless test that uses sound waves to create images of your heart. It provides your doctor with information about the size and shape of your heart and how well your heart's chambers and valves are working. This procedure takes approximately one hour. There are no restrictions for this procedure.  Your cardiac CT will be scheduled at one of the below locations:   [facility] [address] [place], [place] [address] [phone]  If scheduled at [facility], please arrive at the [facility] main entrance of [facility] 30 minutes prior to test start time. Proceed to the [facility] Radiology Department (first floor) to check-in and test prep.  Please follow these instructions carefully (unless otherwise directed):  On the Night Before the Test: . Be sure to Drink plenty of water. . Do not consume any caffeinated/decaffeinated beverages or chocolate 12 hours prior to your test. . Do not take any antihistamines 12 hours prior to your test.  On the Day of the Test: . Drink plenty of water. Do not drink any water within one hour  of the test. . Do not eat any food 4 hours prior to the test. . You may take your regular  medications prior to the test.  . Take metoprolol 50 mg by mouth (Lopressor) two hours prior to test. . HOLD Furosemide morning of the test. . FEMALES- please wear underwire-free bra if available       After the Test: . Drink plenty of water. . After receiving IV contrast, you may experience a mild flushed feeling. This is normal. . On occasion, you may experience a mild rash up to 24 hours after the test. This is not dangerous. If this occurs, you can take Benadryl 25 mg and increase your fluid intake. . If you experience trouble breathing, this can be serious. If it is severe call 911 IMMEDIATELY. If it is mild, please call our office.  Once we have confirmed authorization from your insurance company, we will call you to set up a date and time for your test.   For non-scheduling related questions, please contact the cardiac imaging nurse navigator should you have any questions/concerns: Marchia Bond, Cardiac Imaging Nurse Navigator Burley Saver, Interim Cardiac Imaging Nurse [facility] Direct Office Dial: [phone]   For scheduling needs, including cancellations and rescheduling, please call [phone].    Follow-Up:  2 MONTHS IN THE OFFICE WITH DR. Meda Coffee OR AN APP     Signed, Ena Dawley, MD  12/05/2019 11:27 AM    [place]

## 2019-12-11 DIAGNOSIS — H524 Presbyopia: Secondary | ICD-10-CM | POA: Diagnosis not present

## 2019-12-11 DIAGNOSIS — H01004 Unspecified blepharitis left upper eyelid: Secondary | ICD-10-CM | POA: Diagnosis not present

## 2019-12-11 DIAGNOSIS — H01001 Unspecified blepharitis right upper eyelid: Secondary | ICD-10-CM | POA: Diagnosis not present

## 2019-12-11 DIAGNOSIS — H26493 Other secondary cataract, bilateral: Secondary | ICD-10-CM | POA: Diagnosis not present

## 2019-12-19 ENCOUNTER — Telehealth: Payer: Self-pay | Admitting: *Deleted

## 2019-12-19 NOTE — Telephone Encounter (Signed)
-----   Message from Roosvelt Maser sent at 12/19/2019  9:58 AM EDT ----- Regarding: ct heart   Patient is scheduled on 12/31/19 @ 1:45.  She is aware to call office to schedule her lab  Thanks, Vivien Rota

## 2019-12-19 NOTE — Telephone Encounter (Signed)
Called the pt and scheduled her to come in for BMET, per Coronary CT protocol, for 12/26/19, at our office.  BMET scheduled a couple days prior to CT, incase changes need to be made, for prep of this test.  She is aware of all her other scheduled appts.  Pt verbalized understanding and agrees with this plan.

## 2019-12-26 ENCOUNTER — Other Ambulatory Visit: Payer: Medicare Other | Admitting: *Deleted

## 2019-12-26 ENCOUNTER — Other Ambulatory Visit: Payer: Self-pay

## 2019-12-26 DIAGNOSIS — Z8249 Family history of ischemic heart disease and other diseases of the circulatory system: Secondary | ICD-10-CM

## 2019-12-26 DIAGNOSIS — R072 Precordial pain: Secondary | ICD-10-CM

## 2019-12-26 DIAGNOSIS — I10 Essential (primary) hypertension: Secondary | ICD-10-CM | POA: Diagnosis not present

## 2019-12-26 LAB — BASIC METABOLIC PANEL
BUN/Creatinine Ratio: 16 (ref 12–28)
BUN: 11 mg/dL (ref 8–27)
CO2: 29 mmol/L (ref 20–29)
Calcium: 9.7 mg/dL (ref 8.7–10.3)
Chloride: 104 mmol/L (ref 96–106)
Creatinine, Ser: 0.67 mg/dL (ref 0.57–1.00)
GFR calc Af Amer: 98 mL/min/{1.73_m2} (ref >59)
GFR calc non Af Amer: 85 mL/min/{1.73_m2} (ref >59)
Glucose: 103 mg/dL — ABNORMAL HIGH (ref 65–99)
Potassium: 4.3 mmol/L (ref 3.5–5.2)
Sodium: 141 mmol/L (ref 134–144)

## 2019-12-28 ENCOUNTER — Ambulatory Visit (HOSPITAL_COMMUNITY): Payer: Medicare Other | Attending: Cardiovascular Disease

## 2019-12-28 ENCOUNTER — Other Ambulatory Visit: Payer: Self-pay

## 2019-12-28 ENCOUNTER — Telehealth (HOSPITAL_COMMUNITY): Payer: Self-pay | Admitting: *Deleted

## 2019-12-28 DIAGNOSIS — R072 Precordial pain: Secondary | ICD-10-CM | POA: Insufficient documentation

## 2019-12-28 DIAGNOSIS — I10 Essential (primary) hypertension: Secondary | ICD-10-CM | POA: Diagnosis not present

## 2019-12-28 DIAGNOSIS — Z8249 Family history of ischemic heart disease and other diseases of the circulatory system: Secondary | ICD-10-CM | POA: Diagnosis not present

## 2019-12-28 LAB — ECHOCARDIOGRAM COMPLETE
Area-P 1/2: 4.15 cm2
S' Lateral: 2.4 cm

## 2019-12-28 NOTE — Telephone Encounter (Signed)
Attempted to call patient regarding upcoming cardiac CT appointment. Left message on voicemail with name and callback number  Katelyn Hills RN Navigator Cardiac [facility] [phone] Office [phone] Cell

## 2019-12-28 NOTE — Telephone Encounter (Signed)
Pt returning call regarding upcoming cardiac imaging study; pt verbalizes understanding of appt date/time, parking situation and where to check in, pre-test NPO status and medications ordered, and verified current allergies; name and call back number provided for further questions should they arise ° °Zailyn Rowser Tai RN Navigator Cardiac Imaging °[facility] °[phone] office °[phone] cell ° °

## 2019-12-31 ENCOUNTER — Ambulatory Visit (HOSPITAL_COMMUNITY)
Admission: RE | Admit: 2019-12-31 | Discharge: 2019-12-31 | Disposition: A | Discharge status: Home / Self Care | Payer: Medicare Other | Source: Ambulatory Visit | Attending: Cardiology | Admitting: Cardiology

## 2019-12-31 ENCOUNTER — Telehealth: Payer: Self-pay

## 2019-12-31 ENCOUNTER — Other Ambulatory Visit: Payer: Self-pay

## 2019-12-31 DIAGNOSIS — R072 Precordial pain: Secondary | ICD-10-CM

## 2019-12-31 DIAGNOSIS — I10 Essential (primary) hypertension: Secondary | ICD-10-CM

## 2019-12-31 DIAGNOSIS — Z8249 Family history of ischemic heart disease and other diseases of the circulatory system: Secondary | ICD-10-CM

## 2019-12-31 MED ORDER — NITROGLYCERIN 0.4 MG SL SUBL
SUBLINGUAL_TABLET | SUBLINGUAL | Status: AC
  Filled 2019-12-31: qty 2

## 2019-12-31 MED ORDER — IOHEXOL 350 MG/ML SOLN
80.0000 mL | Freq: Once | INTRAVENOUS | Status: AC | PRN
  Administered 2019-12-31: 80 mL via INTRAVENOUS

## 2019-12-31 MED ORDER — NITROGLYCERIN 0.4 MG SL SUBL
0.8000 mg | SUBLINGUAL_TABLET | Freq: Once | SUBLINGUAL | Status: AC
  Administered 2019-12-31: 0.8 mg via SUBLINGUAL

## 2020-01-01 NOTE — Telephone Encounter (Signed)
Discussed COVID test to be done tomorrow. Please recent cardiology and related studies.

## 2020-01-02 ENCOUNTER — Telehealth: Payer: Self-pay | Admitting: Gastroenterology

## 2020-01-02 ENCOUNTER — Telehealth: Payer: Self-pay | Admitting: *Deleted

## 2020-01-02 ENCOUNTER — Other Ambulatory Visit: Payer: Self-pay | Admitting: Gastroenterology

## 2020-01-02 ENCOUNTER — Telehealth: Payer: Self-pay | Admitting: Cardiology

## 2020-01-02 ENCOUNTER — Ambulatory Visit (INDEPENDENT_AMBULATORY_CARE_PROVIDER_SITE_OTHER): Payer: Medicare Other

## 2020-01-02 DIAGNOSIS — J189 Pneumonia, unspecified organism: Secondary | ICD-10-CM

## 2020-01-02 DIAGNOSIS — Z1159 Encounter for screening for other viral diseases: Secondary | ICD-10-CM | POA: Diagnosis not present

## 2020-01-02 DIAGNOSIS — R911 Solitary pulmonary nodule: Secondary | ICD-10-CM

## 2020-01-02 DIAGNOSIS — J984 Other disorders of lung: Secondary | ICD-10-CM

## 2020-01-02 DIAGNOSIS — R9389 Abnormal findings on diagnostic imaging of other specified body structures: Secondary | ICD-10-CM

## 2020-01-02 NOTE — Telephone Encounter (Signed)
Thanks Beth, will need to reschedule the procedure until after she completes the cardiac work up for chest pain and has clearance from cardiology. Please inform patient

## 2020-01-02 NOTE — Telephone Encounter (Signed)
Message Received: Ruben Im, Jamse Belfast, MD  Nuala Alpha, LPN Minimal non-obstructive CAD (0-24%) in the proximal  RCA and distal left main. Consider non-atherosclerotic causes of  chest pain. Consider preventive therapy and risk factor  modification.

## 2020-01-02 NOTE — Telephone Encounter (Signed)
-----   Message from Dorothy Spark, MD sent at 12/31/2019  6:57 PM EDT ----- Nodular area of architectural distortion in the posterior aspect of the left lower lobe favored to represent an area of post infectious or inflammatory scarring. However, repeat noncontrast chest CT is recommended in 3 months to ensure the stability or resolution of this finding. - PLEASE ARRANGE, thank you!

## 2020-01-02 NOTE — Addendum Note (Signed)
Addended by: Nuala Alpha on: 01/02/2020 10:54 AM   Modules accepted: Orders

## 2020-01-02 NOTE — Telephone Encounter (Signed)
Spoke with the pt and informed her of her Cardiac CT results and recommendations per Dr. Meda Coffee. Informed the pt that per Dr. Meda Coffee, based on the over read CT portion of the test, noted was nodular area of architectural distortion in the posterior aspect of the left lower lobe favored to represent an area of post infectious or inflammatory scarring.  Informed the pt that per Dr. Meda Coffee, it is recommended that she have a Chest CT without Contrast to be done in 3 months, to further evaluate this.  Informed the pt this is to ensure stability or resolution of this finding.  Pt states she did have a URI back in May, and this could be the contributing factor.  Informed the pt that I will place the order for the Chest CT in the system to have done in 3 months, and send a message to our [facility] schedulers, to call her back and arrange this test.  Pt verbalized understanding and agrees with this plan.

## 2020-01-02 NOTE — Telephone Encounter (Signed)
Patient is calling to follow up with Ivy in regards to CT. She states she has additional questions. Please call.

## 2020-01-02 NOTE — Telephone Encounter (Signed)
   [facility] Pre-operative Risk Assessment    HEARTCARE STAFF: - Please ensure there is not already an duplicate clearance open for this procedure. - Under Visit Info/Reason for Call, type in Other and utilize the format Clearance MM/DD/YY or Clearance TBD. Do not use dashes or single digits. - If request is for dental extraction, please clarify the # of teeth to be extracted.  Request for surgical clearance:  1. What type of surgery is being performed? Colonoscopy/ Upper Endoscopy  2. When is this surgery scheduled? Friday 01/04/20  3. What type of clearance is required (medical clearance vs. Pharmacy clearance to hold med vs. Both)? medical  4. Are there any medications that need to be held prior to surgery and how long?no  5. Practice name and name of physician performing surgery? Dr. Teodoro Kil Gam  6. What is the office phone number? [phone]   7.   What is the office fax number? [phone]  8.   Anesthesia type (None, local, MAC, general) ? Propofol    Johnna Acosta 01/02/2020, 2:59 PM  _________________________________________________________________   (provider comments below)

## 2020-01-02 NOTE — Telephone Encounter (Signed)
Pt is asking if we received a clearance request from her GI MD for her upcoming colonoscopy on this Friday.  Informed the pt that we did receive the clearance request, and we have already advised on this, and faxed this back to her Gastroenterologist office.  Below is documentation from our Pre-op Provider, Coletta Memos NP.  Pt verbalized understanding and agrees with this plan.    [name], NP     01/02/20 3:33 PM Note    Primary Cardiologist: Ena Dawley, MD  Chart reviewed as part of pre-operative protocol coverage. Given past medical history and time since last visit, based on ACC/AHA guidelines, Katelyn Taylor would be at acceptable risk for the planned procedure without further cardiovascular testing.   I will route this recommendation to the requesting party via Epic fax function and remove from pre-op pool.  Please call with questions.  Jossie Ng. Cleaver NP-C    01/02/2020, 3:33 PM [place] [address] Office [phone] Fax [phone]

## 2020-01-02 NOTE — Telephone Encounter (Signed)
Pt has some questions regarding the colonoscopy she is to have on Friday 7/23

## 2020-01-02 NOTE — Telephone Encounter (Signed)
° °  Primary Cardiologist: Ena Dawley, MD  Chart reviewed as part of pre-operative protocol coverage. Given past medical history and time since last visit, based on ACC/AHA guidelines, Katelyn Taylor would be at acceptable risk for the planned procedure without further cardiovascular testing.   I will route this recommendation to the requesting party via Epic fax function and remove from pre-op pool.  Please call with questions.  Jossie Ng. Silas Sedam NP-C    01/02/2020, 3:33 PM [place] [address] Office [phone] Fax [phone]

## 2020-01-02 NOTE — Telephone Encounter (Signed)
Call placed to cardiology asking if she is cleared to have the EGD/Colon on 01/04/20. Spoke with the patient and the daughter in law. They are aware this is being questioned and she could be rescheduled depending on cardiology recommendations.

## 2020-01-03 LAB — SARS CORONAVIRUS 2 (TAT 6-24 HRS): SARS Coronavirus 2: NEGATIVE

## 2020-01-03 NOTE — Telephone Encounter (Signed)
Confirmed with the patient her procedure is still to be tomorrow as scheduled. Brief review of the times and instructions.

## 2020-01-03 NOTE — Telephone Encounter (Signed)
Ok thanks 

## 2020-01-04 ENCOUNTER — Other Ambulatory Visit: Payer: Self-pay

## 2020-01-04 ENCOUNTER — Encounter: Payer: Self-pay | Admitting: Gastroenterology

## 2020-01-04 ENCOUNTER — Ambulatory Visit (AMBULATORY_SURGERY_CENTER): Payer: Medicare Other | Admitting: Gastroenterology

## 2020-01-04 VITALS — BP 133/52 | HR 73 | Temp 97.5°F | Resp 17 | Ht 60.0 in | Wt 175.0 lb

## 2020-01-04 DIAGNOSIS — K2970 Gastritis, unspecified, without bleeding: Secondary | ICD-10-CM | POA: Diagnosis not present

## 2020-01-04 DIAGNOSIS — Z1211 Encounter for screening for malignant neoplasm of colon: Secondary | ICD-10-CM | POA: Diagnosis not present

## 2020-01-04 DIAGNOSIS — K2100 Gastro-esophageal reflux disease with esophagitis, without bleeding: Secondary | ICD-10-CM | POA: Diagnosis not present

## 2020-01-04 DIAGNOSIS — R1013 Epigastric pain: Secondary | ICD-10-CM | POA: Diagnosis not present

## 2020-01-04 DIAGNOSIS — K319 Disease of stomach and duodenum, unspecified: Secondary | ICD-10-CM

## 2020-01-04 DIAGNOSIS — R159 Full incontinence of feces: Secondary | ICD-10-CM | POA: Diagnosis not present

## 2020-01-04 DIAGNOSIS — K219 Gastro-esophageal reflux disease without esophagitis: Secondary | ICD-10-CM

## 2020-01-04 DIAGNOSIS — D123 Benign neoplasm of transverse colon: Secondary | ICD-10-CM

## 2020-01-04 DIAGNOSIS — K3189 Other diseases of stomach and duodenum: Secondary | ICD-10-CM | POA: Diagnosis not present

## 2020-01-04 DIAGNOSIS — K2210 Ulcer of esophagus without bleeding: Secondary | ICD-10-CM

## 2020-01-04 MED ORDER — SUCRALFATE 1 G PO TABS
1.0000 g | ORAL_TABLET | Freq: Two times a day (BID) | ORAL | 0 refills | Status: DC
Start: 2020-01-04 — End: 2020-03-14

## 2020-01-04 MED ORDER — SODIUM CHLORIDE 0.9 % IV SOLN: 500.0000 mL | Freq: Once | INTRAVENOUS | Status: DC

## 2020-01-04 MED ORDER — PANTOPRAZOLE SODIUM 40 MG PO TBEC
40.0000 mg | DELAYED_RELEASE_TABLET | Freq: Every day | ORAL | 3 refills | Status: DC
Start: 2020-01-04 — End: 2020-03-14

## 2020-01-04 NOTE — Op Note (Signed)
[facility] Patient Name: Katelyn Taylor Procedure Date: 01/04/2020 3:21 PM MRN: [id number] Endoscopist: Mauri Pole , MD Age: 79 Referring MD:  Date of Birth: Oct 31, 1940 Gender: Female Account #: [id number] Procedure:                Upper GI endoscopy Indications:              Epigastric abdominal pain, Suspected reflux                            esophagitis, Esophageal reflux symptoms that                            persist despite appropriate therapy Medicines:                Monitored Anesthesia Care Procedure:                Pre-Anesthesia Assessment:                           - Prior to the procedure, a History and Physical                            was performed, and patient medications and                            allergies were reviewed. The patient's tolerance of                            previous anesthesia was also reviewed. The risks                            and benefits of the procedure and the sedation                            options and risks were discussed with the patient.                            All questions were answered, and informed consent                            was obtained. Prior Anticoagulants: The patient has                            taken no previous anticoagulant or antiplatelet                            agents. ASA Grade Assessment: II - A patient with                            mild systemic disease. After reviewing the risks                            and benefits, the patient was deemed in  satisfactory condition to undergo the procedure.                           After obtaining informed consent, the endoscope was                            passed under direct vision. Throughout the                            procedure, the patient's blood pressure, pulse, and                            oxygen saturations were monitored continuously. The                            Endoscope was introduced  through the mouth, and                            advanced to the second part of duodenum. The upper                            GI endoscopy was accomplished without difficulty.                            The patient tolerated the procedure well. Scope In: Scope Out: Findings:                 LA Grade C (one or more mucosal breaks continuous                            between tops of 2 or more mucosal folds, less than                            75% circumference) esophagitis with no bleeding was                            found 33 to 35 cm from the incisors.                           The gastroesophageal flap valve was visualized                            endoscopically and classified as Hill Grade III                            (minimal fold, loose to endoscope, hiatal hernia                            likely).                           Patchy mild inflammation characterized by                            congestion (edema),  erosions and erythema was found                            in the entire examined stomach. Biopsies were taken                            with a cold forceps for Helicobacter pylori testing.                           The examined duodenum was normal. Complications:            No immediate complications. Estimated Blood Loss:     Estimated blood loss was minimal. Impression:               - LA Grade C reflux esophagitis with no bleeding.                           - Gastroesophageal flap valve classified as Hill                            Grade III (minimal fold, loose to endoscope, hiatal                            hernia likely).                           - Gastritis. Biopsied.                           - Normal examined duodenum. Recommendation:           - Patient has a contact number available for                            emergencies. The signs and symptoms of potential                            delayed complications were discussed with the                             patient. Return to normal activities tomorrow.                            Written discharge instructions were provided to the                            patient.                           - Resume previous diet.                           - Continue present medications.                           - Await pathology results.                           -  Follow an antireflux regimen indefinitely.                           - Use Protonix (pantoprazole) 40 mg PO daily                            indefinitely.                           - Use sucralfate tablets 1 gram PO BID for 1 month. Mauri Pole, MD 01/04/2020 4:06:56 PM This report has been signed electronically.

## 2020-01-04 NOTE — Patient Instructions (Addendum)
Handouts Provided:  Esophagitis, Hiatal Hernia,  Gastritis and Polyps  YOU HAD AN ENDOSCOPIC PROCEDURE TODAY AT [facility]:   Refer to the procedure report that was given to you for any specific questions about what was found during the examination.  If the procedure report does not answer your questions, please call your gastroenterologist to clarify.  If you requested that your care partner not be given the details of your procedure findings, then the procedure report has been included in a sealed envelope for you to review at your convenience later.  YOU SHOULD EXPECT: Some feelings of bloating in the abdomen. Passage of more gas than usual.  Walking can help get rid of the air that was put into your GI tract during the procedure and reduce the bloating. If you had a lower endoscopy (such as a colonoscopy or flexible sigmoidoscopy) you may notice spotting of blood in your stool or on the toilet paper. If you underwent a bowel prep for your procedure, you may not have a normal bowel movement for a few days.  Please Note:  You might notice some irritation and congestion in your nose or some drainage.  This is from the oxygen used during your procedure.  There is no need for concern and it should clear up in a day or so.  SYMPTOMS TO REPORT IMMEDIATELY:   Following lower endoscopy (colonoscopy or flexible sigmoidoscopy):  Excessive amounts of blood in the stool  Significant tenderness or worsening of abdominal pains  Swelling of the abdomen that is new, acute  Fever of 100F or higher   Following upper endoscopy (EGD)  Vomiting of blood or coffee ground material  New chest pain or pain under the shoulder blades  Painful or persistently difficult swallowing  New shortness of breath  Fever of 100F or higher  Black, tarry-looking stools  For urgent or emergent issues, a gastroenterologist can be reached at any hour by calling [phone]. Do not use MyChart messaging for  urgent concerns.    DIET:  We do recommend a small meal at first, but then you may proceed to your regular diet.  Drink plenty of fluids but you should avoid alcoholic beverages for 24 hours.  ACTIVITY:  You should plan to take it easy for the rest of today and you should NOT DRIVE or use heavy machinery until tomorrow (because of the sedation medicines used during the test).    FOLLOW UP: Our staff will call the number listed on your records 48-72 hours following your procedure to check on you and address any questions or concerns that you may have regarding the information given to you following your procedure. If we do not reach you, we will leave a message.  We will attempt to reach you two times.  During this call, we will ask if you have developed any symptoms of COVID 19. If you develop any symptoms (ie: fever, flu-like symptoms, shortness of breath, cough etc.) before then, please call [phone].  If you test positive for Covid 19 in the 2 weeks post procedure, please call and report this information to [place].    If any biopsies were taken you will be contacted by phone or by letter within the next 1-3 weeks.  Please call us at [phone] if you have not heard about the biopsies in 3 weeks.    SIGNATURES/CONFIDENTIALITY: You and/or your care partner have signed paperwork which will be entered into your electronic medical record.  These  signatures attest to the fact that that the information above on your After Visit Summary has been reviewed and is understood.  Full responsibility of the confidentiality of this discharge information lies with you and/or your care-partner.

## 2020-01-04 NOTE — Progress Notes (Signed)
liocaine 100 mg iv given to reduce gag reflex

## 2020-01-04 NOTE — Op Note (Signed)
[place] [facility] Patient Name: Katelyn Taylor Procedure Date: 01/04/2020 3:20 PM MRN: [id number] Endoscopist: Mauri Pole , MD Age: 79 Referring MD:  Date of Birth: April 06, 1941 Gender: Female Account #: [id number] Procedure:                Colonoscopy Indications:              Screening for colorectal malignant neoplasm Medicines:                Monitored Anesthesia Care Procedure:                Pre-Anesthesia Assessment:                           - Prior to the procedure, a History and Physical                            was performed, and patient medications and                            allergies were reviewed. The patient's tolerance of                            previous anesthesia was also reviewed. The risks                            and benefits of the procedure and the sedation                            options and risks were discussed with the patient.                            All questions were answered, and informed consent                            was obtained. Prior Anticoagulants: The patient has                            taken no previous anticoagulant or antiplatelet                            agents. ASA Grade Assessment: II - A patient with                            mild systemic disease. After reviewing the risks                            and benefits, the patient was deemed in                            satisfactory condition to undergo the procedure.                           After obtaining informed consent, the colonoscope  was passed under direct vision. Throughout the                            procedure, the patient's blood pressure, pulse, and                            oxygen saturations were monitored continuously. The                            Colonoscope was introduced through the anus and                            advanced to the the cecum, identified by                            appendiceal orifice and  ileocecal valve. The                            colonoscopy was performed without difficulty. The                            patient tolerated the procedure well. The quality                            of the bowel preparation was good. The ileocecal                            valve, appendiceal orifice, and rectum were                            photographed. Scope In: 3:42:02 PM Scope Out: 3:59:30 PM Scope Withdrawal Time: 0 hours 11 minutes 59 seconds  Total Procedure Duration: 0 hours 17 minutes 28 seconds  Findings:                 The perianal and digital rectal examinations were                            normal.                           Two sessile polyps were found in the transverse                            colon. The polyps were 5 to 7 mm in size. These                            polyps were removed with a cold snare. Resection                            and retrieval were complete.                           Non-bleeding internal hemorrhoids were found during  retroflexion. The hemorrhoids were small. Complications:            No immediate complications. Estimated Blood Loss:     Estimated blood loss was minimal. Impression:               - Two 5 to 7 mm polyps in the transverse colon,                            removed with a cold snare. Resected and retrieved.                           - Non-bleeding internal hemorrhoids. Recommendation:           - Patient has a contact number available for                            emergencies. The signs and symptoms of potential                            delayed complications were discussed with the                            patient. Return to normal activities tomorrow.                            Written discharge instructions were provided to the                            patient.                           - Resume previous diet.                           - Continue present medications.                            - Await pathology results.                           - Repeat colonoscopy date to be determined after                            pending pathology results are reviewed for                            surveillance based on pathology results. Mauri Pole, MD 01/04/2020 4:08:56 PM This report has been signed electronically.

## 2020-01-04 NOTE — Progress Notes (Signed)
Called to room to assist during endoscopic procedure.  Patient ID and intended procedure confirmed with present staff. Received instructions for my participation in the procedure from the performing physician.  

## 2020-01-04 NOTE — Progress Notes (Signed)
Pt Drowsy. VSS. To PACU, report to RN. No anesthetic complications noted.  

## 2020-01-04 NOTE — Progress Notes (Signed)
VS by [place]. 

## 2020-01-08 ENCOUNTER — Telehealth: Payer: Self-pay

## 2020-01-08 NOTE — Telephone Encounter (Signed)
°  Follow up Call-  Call back number 01/04/2020  Post procedure Call Back phone  # [phone]  Permission to leave phone message Yes  Some recent data might be hidden     Patient questions:  Do you have a fever, pain , or abdominal swelling? No. Pain Score  0 *  Have you tolerated food without any problems? Yes.    Have you been able to return to your normal activities? Yes.    Do you have any questions about your discharge instructions: Diet   No. Medications  No. Follow up visit  No.  Do you have questions or concerns about your Care? No.  Actions: * If pain score is 4 or above: No action needed, pain <4. 1. Have you developed a fever since your procedure? no  2.   Have you had an respiratory symptoms (SOB or cough) since your procedure? no  3.   Have you tested positive for COVID 19 since your procedure no  4.   Have you had any family members/close contacts diagnosed with the COVID 19 since your procedure?  no   If yes to any of these questions please route to Joylene John, RN and Erenest Rasher, RN

## 2020-01-11 DIAGNOSIS — K219 Gastro-esophageal reflux disease without esophagitis: Secondary | ICD-10-CM | POA: Diagnosis not present

## 2020-01-11 DIAGNOSIS — R9389 Abnormal findings on diagnostic imaging of other specified body structures: Secondary | ICD-10-CM | POA: Diagnosis not present

## 2020-01-11 DIAGNOSIS — R079 Chest pain, unspecified: Secondary | ICD-10-CM | POA: Diagnosis not present

## 2020-01-11 DIAGNOSIS — F419 Anxiety disorder, unspecified: Secondary | ICD-10-CM | POA: Diagnosis not present

## 2020-01-22 ENCOUNTER — Encounter: Payer: Self-pay | Admitting: Gastroenterology

## 2020-01-30 NOTE — Progress Notes (Signed)
Cardiology Office Note    Date:  02/05/2020   ID:  Katelyn, Taylor 1940-06-22, MRN [id number]  PCP:  Aura Dials, MD  Cardiologist: Ena Dawley, MD EPS: None  Chief Complaint  Patient presents with  . Follow-up    History of Present Illness:  Katelyn Taylor is a 79 y.o. female with history of hypertension, HLD, family history of CAD with her father dying of an MI in his 72s, orthostatic hypotension.  She saw Dr. Meda Coffee for some chest pain and ordered a coronary CTA 7/20/21which showed calcium score of 8, minimal nonobstructive CAD 0 to 24% in the proximal RCA and distal left main.  Chest pain felt to be noncardiac.  Orthostatic hypotension symptoms and 2D echo was ordered and showed normal LV function 60 to 65% with moderate concentric LVH and grade 1 DD.  CT over read showed left lower lobe nodular area could be due to infectious or inflammatory scarring.  Recommend repeat CT in 3 months which is scheduled in October.  Patient comes in for f/u. Under a lot of stress. Tearful about husbands death last year. She has declined counseling and is relying on God getting her through it. Has occasional sharp shooting chest pain but no tightness. Has occasional dizziness when allergies are bothering her. Had Epley procedure for dizziness several years ago and no problems since.    Past Medical History:  Diagnosis Date  . Allergic rhinitis   . Anticoagulated   . Anxiety   . Arthritis   . Back pain   . Cataracts, bilateral   . Chronic gastritis   . Clotting disorder ([place])   . Depression   . DVT (deep venous thrombosis) ([place])   . Estrogen deficiency   . External hemorrhoid   . Fatty liver   . GERD (gastroesophageal reflux disease)   . Hiatal hernia   . Hx of migraines   . Hypercholesterolemia   . Hypertension   . IBS (irritable bowel syndrome)   . Neck pain   . Osteopenia 08/2014   T score -2.1 FRAX 13%/1.5%  . Panic attacks   . Severe dysplasia of cervix (CIN III)   .  Shingles   . Stress   . Vitamin D deficiency     Past Surgical History:  Procedure Laterality Date  . CATARACT EXTRACTION, BILATERAL    . DIAGNOSTIC LAPAROSCOPIC LIVER BIOPSY  03/16/2013  . KNEE ARTHROSCOPY Right   . LAPAROSCOPIC CHOLECYSTECTOMY  03/16/2013  . TMJ ARTHROPLASTY    . TUBAL LIGATION    . VAGINAL HYSTERECTOMY  1979   Partial for high-grade dysplasia and dysfunctional bleeding historically    Current Medications: Current Meds  Medication Sig  . aspirin 81 MG chewable tablet Chew 81 mg by mouth daily.   . calcium carbonate (TUMS EX) 750 MG chewable tablet Chew 1 tablet by mouth daily as needed for heartburn.  . cetirizine (ZYRTEC) 10 MG tablet Take 10 mg by mouth daily.  . Cholecalciferol (VITAMIN D PO) Take 1 tablet by mouth daily.   . CYANOCOBALAMIN PO Take 1 tablet by mouth daily.  . diazepam (VALIUM) 10 MG tablet Take 1 tablet (10 mg total) by mouth as needed.  . fish oil-omega-3 fatty acids 1000 MG capsule Take 1 g by mouth daily.   . furosemide (LASIX) 40 MG tablet Take 40 mg by mouth daily.   Marland Kitchen lisinopril (PRINIVIL,ZESTRIL) 5 MG tablet Take 5 mg by mouth daily.  . Multiple Vitamins-Minerals (WOMENS MULTIVITAMIN PO)  Take by mouth.  . pantoprazole (PROTONIX) 40 MG tablet Take 1 tablet (40 mg total) by mouth daily.  . sodium chloride (OCEAN) 0.65 % SOLN nasal spray Place 2 sprays into both nostrils as needed for congestion.  . sucralfate (CARAFATE) 1 g tablet Take 1 tablet (1 g total) by mouth 2 (two) times daily.  Marland Kitchen VITAMIN E PO Take 1 tablet by mouth daily.      Allergies:   Amoxicillin, Cephalexin, Codeine, Nsaids, Penicillins, and Sulfonamide derivatives   Social History   Socioeconomic History  . Marital status: Married    Spouse name: Not on file  . Number of children: 3  . Years of education: Not on file  . Highest education level: Not on file  Occupational History  . Occupation: Arboriculturist: RETIRED  . Occupation: caregiver  Tobacco Use   . Smoking status: Former Smoker    Types: Cigarettes  . Smokeless tobacco: Never Used  Vaping Use  . Vaping Use: Never used  Substance and Sexual Activity  . Alcohol use: No  . Drug use: No  . Sexual activity: Never    Birth control/protection: Surgical    Comment: 1st encounter over age 62; 3 partners lifetime  Other Topics Concern  . Not on file  Social History Narrative   Lives home with husband, son, grandson.  Children 3.    Social Determinants of Health   Financial Resource Strain:   . Difficulty of Paying Living Expenses: Not on file  Food Insecurity:   . Worried About Charity fundraiser in the Last Year: Not on file  . Ran Out of Food in the Last Year: Not on file  Transportation Needs:   . Lack of Transportation (Medical): Not on file  . Lack of Transportation (Non-Medical): Not on file  Physical Activity:   . Days of Exercise per Week: Not on file  . Minutes of Exercise per Session: Not on file  Stress:   . Feeling of Stress : Not on file  Social Connections:   . Frequency of Communication with Friends and Family: Not on file  . Frequency of Social Gatherings with Friends and Family: Not on file  . Attends Religious Services: Not on file  . Active Member of Clubs or Organizations: Not on file  . Attends Archivist Meetings: Not on file  . Marital Status: Not on file     Family History:  The patient's   family history includes Breast cancer in her maternal aunt; Breast cancer (age of onset: 68) in her sister; Cancer in her brother, mother, and sister; Heart disease in her father, mother, and sister; Lung cancer in her brother; Melanoma in her mother; Ovarian cancer in an other family member; Stroke in her mother; Throat cancer in her brother.   ROS:   Please see the history of present illness.    ROS All other systems reviewed and are negative.   PHYSICAL EXAM:   VS:  BP (!) 142/62   Pulse 82   Ht 5' (1.524 m)   Wt 174 lb (78.9 kg)   SpO2 96%    BMI 33.98 kg/m   Physical Exam  GEN: Well nourished, well developed, in no acute distress  Neck: no JVD, carotid bruits, or masses Cardiac:RRR; no murmurs, rubs, or gallops  Respiratory:  clear to auscultation bilaterally, normal work of breathing GI: soft, nontender, nondistended, + BS Ext: without cyanosis, clubbing, or edema, Good distal pulses bilaterally Neuro:  Alert and Oriented x 3 Psych: euthymic mood, full affect  Wt Readings from Last 3 Encounters:  02/05/20 174 lb (78.9 kg)  01/04/20 175 lb (79.4 kg)  12/05/19 175 lb 6.4 oz (79.6 kg)      Studies/Labs Reviewed:   EKG:  EKG is not ordered today.   Recent Labs: 12/26/2019: BUN 11; Creatinine, Ser 0.67; Potassium 4.3; Sodium 141   Lipid Panel    Component Value Date/Time   CHOL 135 02/22/2012 1051   TRIG 78 02/22/2012 1051   HDL 57 02/22/2012 1051   CHOLHDL 2.4 02/22/2012 1051   VLDL 16 02/22/2012 1051   LDLCALC 62 02/22/2012 1051    Additional studies/ records that were reviewed today include:  Coronary CTA 12/31/2019 IMPRESSION: 1. Coronary calcium score of 8. This was 95 percentile for age and sex matched control.   2. Normal coronary origin with right dominance.   3. CAD-RADS 1. Minimal non-obstructive CAD (0-24%) in the proximal RCA and distal left main. Consider non-atherosclerotic causes of chest pain. Consider preventive therapy and risk factor modification.     Electronically Signed   By: Ena Dawley   On: 01/01/2020 14:49   2D echo 7/16/2021IMPRESSIONS     1. Left ventricular ejection fraction, by estimation, is 60 to 65%. The  left ventricle has normal function. The left ventricle has no regional  wall motion abnormalities. There is moderate concentric left ventricular  hypertrophy. Left ventricular  diastolic parameters are consistent with Grade I diastolic dysfunction  (impaired relaxation).   2. Right ventricular systolic function is normal. The right ventricular  size is  normal. Tricuspid regurgitation signal is inadequate for assessing  PA pressure.   3. The mitral valve is normal in structure. Trivial mitral valve  regurgitation. No evidence of mitral stenosis.   4. The aortic valve is tricuspid. Aortic valve regurgitation is mild. No  aortic stenosis is present.   5. The inferior vena cava is dilated in size with <50% respiratory  variability, suggesting right atrial pressure of 15 mmHg.   FINDINGS   Left Ventricle: Left ventricular ejection fraction, by estimation, is 60  to 65%. The left ventricle has normal function. The left ventricle has no  regional wall motion abnormalities. The left ventricular internal cavity  size was normal in size. There is   moderate concentric left ventricular hypertrophy. Left ventricular  diastolic parameters are consistent with Grade I diastolic dysfunction  (impaired relaxation). Normal left ventricular filling pressure.   Right Ventricle: The right ventricular size is normal. No increase in  right ventricular wall thickness. Right ventricular systolic function is  normal. Tricuspid regurgitation signal is inadequate for assessing PA  pressure.   Left Atrium: Left atrial size was normal in size.   Right Atrium: Right atrial size was normal in size.   Pericardium: There is no evidence of pericardial effusion.   Mitral Valve: The mitral valve is normal in structure. Normal mobility of  the mitral valve leaflets. Trivial mitral valve regurgitation. No evidence  of mitral valve stenosis.   Tricuspid Valve: The tricuspid valve is normal in structure. Tricuspid  valve regurgitation is not demonstrated. No evidence of tricuspid  stenosis.   Aortic Valve: The aortic valve is tricuspid. Aortic valve regurgitation is  mild. No aortic stenosis is present.   Pulmonic Valve: The pulmonic valve was normal in structure. Pulmonic valve  regurgitation is trivial. No evidence of pulmonic stenosis.   Aorta: The aortic  root is normal in size and structure.  Venous: The inferior vena cava is dilated in size with less than 50%  respiratory variability, suggesting right atrial pressure of 15 mmHg.   IAS/Shunts: No atrial level shunt detected by color flow Doppler.      ASSESSMENT:    1. Coronary artery disease involving native coronary artery of native heart without angina pectoris   2. Essential hypertension   3. Hyperlipidemia, unspecified hyperlipidemia type   4. Family history of early CAD   30. History of orthostatic hypotension   6. Nodule of lower lobe of left lung      PLAN:  In order of problems listed above:  Chest pain coronary CTA showed calcium score of 8-26% age and sex matched control, minimal nonobstructive CAD 0 to 24% in the proximal RCA and distal left main.  Consider nonatherosclerotic causes of chest pain and consider preventative therapy and risk factor modification. Under a lot of stress. 150 min exercise weekly  HTN BP up a little but stressed today, overall controlled on zestril  HLD LDL 71 08/16/19  Family history of CAD father died MI 84's  Orthostatic hypotension 2D echo 12/28/2019 normal LVEF 60 to 65% with grade 1 DD-no recent problems  Lung nodule felt to be scarring or inflammatory on CT 12/2019 recommend repeat in 3 months.  Scheduled for April 04, 2020   Medication Adjustments/Labs and Tests Ordered: Current medicines are reviewed at length with the patient today.  Concerns regarding medicines are outlined above.  Medication changes, Labs and Tests ordered today are listed in the Patient Instructions below. Patient Instructions   Medication Instructions:  Your physician recommends that you continue on your current medications as directed. Please refer to the Current Medication list given to you today.  *If you need a refill on your cardiac medications before your next appointment, please call your pharmacy*   Lab Work: None If you have labs (blood work)  drawn today and your tests are completely normal, you will receive your results only by: Marland Kitchen MyChart Message (if you have MyChart) OR . A paper copy in the mail If you have any lab test that is abnormal or we need to change your treatment, we will call you to review the results.   Testing/Procedures: None   Follow-Up: At [facility], you and your health needs are our priority.  As part of our continuing mission to provide you with exceptional heart care, we have created designated Provider Care Teams.  These Care Teams include your primary Cardiologist (physician) and Advanced Practice Providers (APPs -  Physician Assistants and Nurse Practitioners) who all work together to provide you with the care you need, when you need it.  We recommend signing up for the patient portal called "MyChart".  Sign up information is provided on this After Visit Summary.  MyChart is used to connect with patients for Virtual Visits (Telemedicine).  Patients are able to view lab/test results, encounter notes, upcoming appointments, etc.  Non-urgent messages can be sent to your provider as well.   To learn more about what you can do with MyChart, go to [email or web address].    Your next appointment:   6 month(s)  The format for your next appointment:   In Person  Provider:   You may see Ena Dawley, MD or one of the following Advanced Practice Providers on your designated Care Team:    Melina Copa, PA-C  Ermalinda Barrios, PA-C    Other Instructions Your provider recommends that you maintain 150 minutes per week of  moderate aerobic activity.  Two Gram Sodium Diet 2000 mg  What is Sodium? Sodium is a mineral found naturally in many foods. The most significant source of sodium in the diet is table salt, which is about 40% sodium.  Processed, convenience, and preserved foods also contain a large amount of sodium.  The body needs only 500 mg of sodium daily to function,  A normal diet provides more than  enough sodium even if you do not use salt.  Why Limit Sodium? A build up of sodium in the body can cause thirst, increased blood pressure, shortness of breath, and water retention.  Decreasing sodium in the diet can reduce edema and risk of heart attack or stroke associated with high blood pressure.  Keep in mind that there are many other factors involved in these health problems.  Heredity, obesity, lack of exercise, cigarette smoking, stress and what you eat all play a role.  General Guidelines:  Do not add salt at the table or in cooking.  One teaspoon of salt contains over 2 grams of sodium.  Read food labels  Avoid processed and convenience foods  Ask your dietitian before eating any foods not dicussed in the menu planning guidelines  Consult your physician if you wish to use a salt substitute or a sodium containing medication such as antacids.  Limit milk and milk products to 16 oz (2 cups) per day.  Shopping Hints:  READ LABELS!! "Dietetic" does not necessarily mean low sodium.  Salt and other sodium ingredients are often added to foods during processing.   Menu Planning Guidelines Food Group Choose More Often Avoid  Beverages (see also the milk group All fruit juices, low-sodium, salt-free vegetables juices, low-sodium carbonated beverages Regular vegetable or tomato juices, commercially softened water used for drinking or cooking  Breads and Cereals Enriched white, wheat, rye and pumpernickel bread, hard rolls and dinner rolls; muffins, cornbread and waffles; most dry cereals, cooked cereal without added salt; unsalted crackers and breadsticks; low sodium or homemade bread crumbs Bread, rolls and crackers with salted tops; quick breads; instant hot cereals; pancakes; commercial bread stuffing; self-rising flower and biscuit mixes; regular bread crumbs or cracker crumbs  Desserts and Sweets Desserts and sweets mad with mild should be within allowance Instant pudding mixes and cake  mixes  Fats Butter or margarine; vegetable oils; unsalted salad dressings, regular salad dressings limited to 1 Tbs; light, sour and heavy cream Regular salad dressings containing bacon fat, bacon bits, and salt pork; snack dips made with instant soup mixes or processed cheese; salted nuts  Fruits Most fresh, frozen and canned fruits Fruits processed with salt or sodium-containing ingredient (some dried fruits are processed with sodium sulfites        Vegetables Fresh, frozen vegetables and low- sodium canned vegetables Regular canned vegetables, sauerkraut, pickled vegetables, and others prepared in brine; frozen vegetables in sauces; vegetables seasoned with ham, bacon or salt pork  Condiments, Sauces, Miscellaneous  Salt substitute with physician's approval; pepper, herbs, spices; vinegar, lemon or lime juice; hot pepper sauce; garlic powder, onion powder, low sodium soy sauce (1 Tbs.); low sodium condiments (ketchup, chili sauce, mustard) in limited amounts (1 tsp.) fresh ground horseradish; unsalted tortilla chips, pretzels, potato chips, popcorn, salsa (1/4 cup) Any seasoning made with salt including garlic salt, celery salt, onion salt, and seasoned salt; sea salt, rock salt, kosher salt; meat tenderizers; monosodium glutamate; mustard, regular soy sauce, barbecue, sauce, chili sauce, teriyaki sauce, steak sauce, Worcestershire sauce, and most flavored  vinegars; canned gravy and mixes; regular condiments; salted snack foods, olives, picles, relish, horseradish sauce, catsup   Food preparation: Try these seasonings Meats:    Pork Sage, onion Serve with applesauce  Chicken Poultry seasoning, thyme, parsley Serve with cranberry sauce  Lamb Curry powder, rosemary, garlic, thyme Serve with mint sauce or jelly  Veal Marjoram, basil Serve with current jelly, cranberry sauce  Beef Pepper, bay leaf Serve with dry mustard, unsalted chive butter  Fish [place] leaf, dill Serve with unsalted lemon butter,  unsalted parsley butter  Vegetables:    Asparagus Lemon juice   Broccoli Lemon juice   Carrots Mustard dressing parsley, mint, nutmeg, glazed with unsalted butter and sugar   Green beans Marjoram, lemon juice, nutmeg,dill seed   Tomatoes Basil, marjoram, onion   Spice /blend for [employer]" 4 tsp ground thyme 1 tsp ground sage 3 tsp ground rosemary 4 tsp ground marjoram   Test your knowledge 1. A product that says "Salt Free" may still contain sodium. True or False 2. Garlic Powder and Hot Pepper Sauce an be used as alternative seasonings.True or False 3. Processed foods have more sodium than fresh foods.  True or False 4. Canned Vegetables have less sodium than froze True or False  WAYS TO DECREASE YOUR SODIUM INTAKE 1. Avoid the use of added salt in cooking and at the table.  Table salt (and other prepared seasonings which contain salt) is probably one of the greatest sources of sodium in the diet.  Unsalted foods can gain flavor from the sweet, sour, and butter taste sensations of herbs and spices.  Instead of using salt for seasoning, try the following seasonings with the foods listed.  Remember: how you use them to enhance natural food flavors is limited only by your creativity... Allspice-Meat, fish, eggs, fruit, peas, red and yellow vegetables Almond Extract-Fruit baked goods Anise Seed-Sweet breads, fruit, carrots, beets, cottage cheese, cookies (tastes like licorice) Basil-Meat, fish, eggs, vegetables, rice, vegetables salads, soups, sauces Bay Leaf-Meat, fish, stews, poultry Burnet-Salad, vegetables (cucumber-like flavor) Caraway Seed-Bread, cookies, cottage cheese, meat, vegetables, cheese, rice Cardamon-Baked goods, fruit, soups Celery Powder or seed-Salads, salad dressings, sauces, meatloaf, soup, bread.Do not use  celery salt Chervil-Meats, salads, fish, eggs, vegetables, cottage cheese (parsley-like flavor) Chili Power-Meatloaf, chicken cheese, corn, eggplant, egg  dishes Chives-Salads cottage cheese, egg dishes, soups, vegetables, sauces Cilantro-Salsa, casseroles Cinnamon-Baked goods, fruit, pork, lamb, chicken, carrots Cloves-Fruit, baked goods, fish, pot roast, green beans, beets, carrots Coriander-Pastry, cookies, meat, salads, cheese (lemon-orange flavor) Cumin-Meatloaf, fish,cheese, eggs, cabbage,fruit pie (caraway flavor) [employer], fruit, eggs, fish, poultry, cottage cheese, vegetables Dill Seed-Meat, cottage cheese, poultry, vegetables, fish, salads, bread Fennel Seed-Bread, cookies, apples, pork, eggs, fish, beets, cabbage, cheese, Licorice-like flavor Garlic-(buds or powder) Salads, meat, poultry, fish, bread, butter, vegetables, potatoes.Do not  use garlic salt Ginger-Fruit, vegetables, baked goods, meat, fish, poultry Horseradish Root-Meet, vegetables, butter Lemon Juice or Extract-Vegetables, fruit, tea, baked goods, fish salads Mace-Baked goods fruit, vegetables, fish, poultry (taste like nutmeg) Maple Extract-Syrups Marjoram-Meat, chicken, fish, vegetables, breads, green salads (taste like Sage) Mint-Tea, lamb, sherbet, vegetables, desserts, carrots, cabbage Mustard, Dry or Seed-Cheese, eggs, meats, vegetables, poultry Nutmeg-Baked goods, fruit, chicken, eggs, vegetables, desserts Onion Powder-Meat, fish, poultry, vegetables, cheese, eggs, bread, rice salads (Do not use   Onion salt) Orange Extract-Desserts, baked goods Oregano-Pasta, eggs, cheese, onions, pork, lamb, fish, chicken, vegetables, green salads Paprika-Meat, fish, poultry, eggs, cheese, vegetables Parsley Flakes-Butter, vegetables, meat fish, poultry, eggs, bread, salads (certain forms may   Contain  sodium Pepper-Meat fish, poultry, vegetables, eggs Peppermint Extract-Desserts, baked goods Poppy Seed-Eggs, bread, cheese, fruit dressings, baked goods, noodles, vegetables, cottage  [employer], poultry, meat, fish,  cauliflower, turnips,eggs bread Saffron-Rice, bread, veal, chicken, fish, eggs Sage-Meat, fish, poultry, onions, eggplant, tomateos, pork, stews Savory-Eggs, salads, poultry, meat, rice, vegetables, soups, pork Tarragon-Meat, poultry, fish, eggs, butter, vegetables (licorice-like flavor)  Thyme-Meat, poultry, fish, eggs, vegetables, (clover-like flavor), sauces, soups Tumeric-Salads, butter, eggs, fish, rice, vegetables (saffron-like flavor) Vanilla Extract-Baked goods, candy Vinegar-Salads, vegetables, meat marinades Walnut Extract-baked goods, candy  2. Choose your Foods Wisely   The following is a list of foods to avoid which are high in sodium:  Meats-Avoid all smoked, canned, salt cured, dried and kosher meat and fish as well as Anchovies   [employer] meats:Bologna, Liverwurst, Pastrami Canned meat or fish  Marinated herring Caviar    Pepperoni Corned Beef   Pizza Dried chipped beef  Salami Frozen breaded fish or meat Salt pork Frankfurters or hot dogs  Sardines Gefilte fish   Sausage Ham (boiled ham, Proscuitto Smoked butt    spiced ham)   Spam      TV Dinners Vegetables Canned vegetables (Regular) Relish Canned mushrooms  Sauerkraut Olives    Tomato juice Pickles  Bakery and Dessert Products Canned puddings  Cream pies Cheesecake   Decorated cakes Cookies  Beverages/Juices Tomato juice, regular  Gatorade   V-8 vegetable juice, regular  Breads and Cereals Biscuit mixes   Salted potato chips, corn chips, pretzels Bread stuffing mixes  Salted crackers and rolls Pancake and waffle mixes Self-rising flour  Seasonings Accent    Meat sauces Barbecue sauce  Meat tenderizer Catsup    Monosodium glutamate (MSG) Celery salt   Onion salt Chili sauce   Prepared mustard Garlic salt   Salt, seasoned salt, sea salt Gravy mixes   Soy sauce Horseradish   Steak sauce Ketchup   Tartar sauce Lite salt    Teriyaki sauce Marinade mixes   Worcestershire  sauce  Others Baking powder   Cocoa and cocoa mixes Baking soda   Commercial casserole mixes Candy-caramels, chocolate  Dehydrated soups    Bars, fudge,nougats  Instant rice and pasta mixes Canned broth or soup  Maraschino cherries Cheese, aged and processed cheese and cheese spreads  Learning Assessment Quiz  Indicated T (for True) or F (for False) for each of the following statements:  1. _____ Fresh fruits and vegetables and unprocessed grains are generally low in sodium 2. _____ Water may contain a considerable amount of sodium, depending on the source 3. _____ You can always tell if a food is high in sodium by tasting it 4. _____ Certain laxatives my be high in sodium and should be avoided unless prescribed   by a physician or pharmacist 5. _____ Salt substitutes may be used freely by anyone on a sodium restricted diet 6. _____ Sodium is present in table salt, food additives and as a natural component of   most foods 7. _____ Table salt is approximately 90% sodium 8. _____ Limiting sodium intake may help prevent excess fluid accumulation in the body 9. _____ On a sodium-restricted diet, seasonings such as bouillon soy sauce, and    cooking wine should be used in place of table salt 10. _____ On an ingredient list, a product which lists monosodium glutamate as the first   ingredient is an appropriate food to include on a low sodium diet  Circle the best answer(s) to  the following statements (Hint: there may be more than one correct answer)  11. On a low-sodium diet, some acceptable snack items are:    A. Olives  F. Bean dip   K. Grapefruit juice    B. Salted Pretzels G. Commercial Popcorn   L. Canned peaches    C. Carrot Sticks  H. Bouillon   M. Unsalted nuts   D. [place] fries  I. Peanut butter crackers N. Salami   E. Sweet pickles J. Tomato Juice   O. Pizza  12.  Seasonings that may be used freely on a reduced - sodium diet include   A. Lemon wedges F.Monosodium  glutamate K. Celery seed    B.Soysauce   G. Pepper   L. Mustard powder   C. Sea salt  H. Cooking wine  M. Onion flakes   D. Vinegar  E. Prepared horseradish N. Salsa   E. Sage   J. Worcestershire sauce  O. [address]      Sumner Boast, PA-C  02/05/2020 10:53 AM    [facility] [address], [place], [place]  [address] Phone: [phone]; Fax: [phone]

## 2020-01-31 ENCOUNTER — Telehealth: Payer: Self-pay | Admitting: Gastroenterology

## 2020-02-01 NOTE — Telephone Encounter (Signed)
Per patient request, faxed her procedure reports and pathology report to her PCP.  Discussed the size of the polyps and the what the precancerous state means. Patient understands that she does not have cancer from her polyps.

## 2020-02-05 ENCOUNTER — Ambulatory Visit (INDEPENDENT_AMBULATORY_CARE_PROVIDER_SITE_OTHER): Payer: Medicare Other | Admitting: Physician Assistant

## 2020-02-05 ENCOUNTER — Other Ambulatory Visit: Payer: Self-pay

## 2020-02-05 ENCOUNTER — Encounter: Payer: Self-pay | Admitting: Physician Assistant

## 2020-02-05 VITALS — BP 142/62 | HR 82 | Ht 60.0 in | Wt 174.0 lb

## 2020-02-05 DIAGNOSIS — I10 Essential (primary) hypertension: Secondary | ICD-10-CM | POA: Diagnosis not present

## 2020-02-05 DIAGNOSIS — E785 Hyperlipidemia, unspecified: Secondary | ICD-10-CM | POA: Diagnosis not present

## 2020-02-05 DIAGNOSIS — I2510 Atherosclerotic heart disease of native coronary artery without angina pectoris: Secondary | ICD-10-CM | POA: Diagnosis not present

## 2020-02-05 DIAGNOSIS — R911 Solitary pulmonary nodule: Secondary | ICD-10-CM | POA: Diagnosis not present

## 2020-02-05 DIAGNOSIS — Z8679 Personal history of other diseases of the circulatory system: Secondary | ICD-10-CM | POA: Diagnosis not present

## 2020-02-05 DIAGNOSIS — Z8249 Family history of ischemic heart disease and other diseases of the circulatory system: Secondary | ICD-10-CM | POA: Diagnosis not present

## 2020-02-05 NOTE — Patient Instructions (Signed)
Medication Instructions:  Your physician recommends that you continue on your current medications as directed. Please refer to the Current Medication list given to you today.  *If you need a refill on your cardiac medications before your next appointment, please call your pharmacy*   Lab Work: None If you have labs (blood work) drawn today and your tests are completely normal, you will receive your results only by: Marland Kitchen MyChart Message (if you have MyChart) OR . A paper copy in the mail If you have any lab test that is abnormal or we need to change your treatment, we will call you to review the results.   Testing/Procedures: None   Follow-Up: At [facility], you and your health needs are our priority.  As part of our continuing mission to provide you with exceptional heart care, we have created designated Provider Care Teams.  These Care Teams include your primary Cardiologist (physician) and Advanced Practice Providers (APPs -  Physician Assistants and Nurse Practitioners) who all work together to provide you with the care you need, when you need it.  We recommend signing up for the patient portal called "MyChart".  Sign up information is provided on this After Visit Summary.  MyChart is used to connect with patients for Virtual Visits (Telemedicine).  Patients are able to view lab/test results, encounter notes, upcoming appointments, etc.  Non-urgent messages can be sent to your provider as well.   To learn more about what you can do with MyChart, go to [email or web address].    Your next appointment:   6 month(s)  The format for your next appointment:   In Person  Provider:   You may see Ena Dawley, MD or one of the following Advanced Practice Providers on your designated Care Team:    Melina Copa, PA-C  Ermalinda Barrios, PA-C    Other Instructions Your provider recommends that you maintain 150 minutes per week of moderate aerobic activity.  Two Gram Sodium Diet 2000  mg  What is Sodium? Sodium is a mineral found naturally in many foods. The most significant source of sodium in the diet is table salt, which is about 40% sodium.  Processed, convenience, and preserved foods also contain a large amount of sodium.  The body needs only 500 mg of sodium daily to function,  A normal diet provides more than enough sodium even if you do not use salt.  Why Limit Sodium? A build up of sodium in the body can cause thirst, increased blood pressure, shortness of breath, and water retention.  Decreasing sodium in the diet can reduce edema and risk of heart attack or stroke associated with high blood pressure.  Keep in mind that there are many other factors involved in these health problems.  Heredity, obesity, lack of exercise, cigarette smoking, stress and what you eat all play a role.  General Guidelines:  Do not add salt at the table or in cooking.  One teaspoon of salt contains over 2 grams of sodium.  Read food labels  Avoid processed and convenience foods  Ask your dietitian before eating any foods not dicussed in the menu planning guidelines  Consult your physician if you wish to use a salt substitute or a sodium containing medication such as antacids.  Limit milk and milk products to 16 oz (2 cups) per day.  Shopping Hints:  READ LABELS!! "Dietetic" does not necessarily mean low sodium.  Salt and other sodium ingredients are often added to foods during processing.   Menu Planning  Guidelines Food Group Choose More Often Avoid  Beverages (see also the milk group All fruit juices, low-sodium, salt-free vegetables juices, low-sodium carbonated beverages Regular vegetable or tomato juices, commercially softened water used for drinking or cooking  Breads and Cereals Enriched white, wheat, rye and pumpernickel bread, hard rolls and dinner rolls; muffins, cornbread and waffles; most dry cereals, cooked cereal without added salt; unsalted crackers and breadsticks; low  sodium or homemade bread crumbs Bread, rolls and crackers with salted tops; quick breads; instant hot cereals; pancakes; commercial bread stuffing; self-rising flower and biscuit mixes; regular bread crumbs or cracker crumbs  Desserts and Sweets Desserts and sweets mad with mild should be within allowance Instant pudding mixes and cake mixes  Fats Butter or margarine; vegetable oils; unsalted salad dressings, regular salad dressings limited to 1 Tbs; light, sour and heavy cream Regular salad dressings containing bacon fat, bacon bits, and salt pork; snack dips made with instant soup mixes or processed cheese; salted nuts  Fruits Most fresh, frozen and canned fruits Fruits processed with salt or sodium-containing ingredient (some dried fruits are processed with sodium sulfites        Vegetables Fresh, frozen vegetables and low- sodium canned vegetables Regular canned vegetables, sauerkraut, pickled vegetables, and others prepared in brine; frozen vegetables in sauces; vegetables seasoned with ham, bacon or salt pork  Condiments, Sauces, Miscellaneous  Salt substitute with physician's approval; pepper, herbs, spices; vinegar, lemon or lime juice; hot pepper sauce; garlic powder, onion powder, low sodium soy sauce (1 Tbs.); low sodium condiments (ketchup, chili sauce, mustard) in limited amounts (1 tsp.) fresh ground horseradish; unsalted tortilla chips, pretzels, potato chips, popcorn, salsa (1/4 cup) Any seasoning made with salt including garlic salt, celery salt, onion salt, and seasoned salt; sea salt, rock salt, kosher salt; meat tenderizers; monosodium glutamate; mustard, regular soy sauce, barbecue, sauce, chili sauce, teriyaki sauce, steak sauce, Worcestershire sauce, and most flavored vinegars; canned gravy and mixes; regular condiments; salted snack foods, olives, picles, relish, horseradish sauce, catsup   Food preparation: Try these seasonings Meats:    Pork Sage, onion Serve with applesauce   Chicken Poultry seasoning, thyme, parsley Serve with cranberry sauce  Lamb Curry powder, rosemary, garlic, thyme Serve with mint sauce or jelly  Veal Marjoram, basil Serve with current jelly, cranberry sauce  Beef Pepper, bay leaf Serve with dry mustard, unsalted chive butter  Fish Bay leaf, dill Serve with unsalted lemon butter, unsalted parsley butter  Vegetables:    Asparagus Lemon juice   Broccoli Lemon juice   Carrots Mustard dressing parsley, mint, nutmeg, glazed with unsalted butter and sugar   Green beans Marjoram, lemon juice, nutmeg,dill seed   Tomatoes Basil, marjoram, onion   Spice /blend for [employer]" 4 tsp ground thyme 1 tsp ground sage 3 tsp ground rosemary 4 tsp ground marjoram   Test your knowledge 1. A product that says "Salt Free" may still contain sodium. True or False 2. Garlic Powder and Hot Pepper Sauce an be used as alternative seasonings.True or False 3. Processed foods have more sodium than fresh foods.  True or False 4. Canned Vegetables have less sodium than froze True or False  WAYS TO DECREASE YOUR SODIUM INTAKE 1. Avoid the use of added salt in cooking and at the table.  Table salt (and other prepared seasonings which contain salt) is probably one of the greatest sources of sodium in the diet.  Unsalted foods can gain flavor from the sweet, sour, and butter taste sensations of  herbs and spices.  Instead of using salt for seasoning, try the following seasonings with the foods listed.  Remember: how you use them to enhance natural food flavors is limited only by your creativity... Allspice-Meat, fish, eggs, fruit, peas, red and yellow vegetables Almond Extract-Fruit baked goods Anise Seed-Sweet breads, fruit, carrots, beets, cottage cheese, cookies (tastes like licorice) Basil-Meat, fish, eggs, vegetables, rice, vegetables salads, soups, sauces Bay Leaf-Meat, fish, stews, poultry Burnet-Salad, vegetables (cucumber-like flavor) Caraway Seed-Bread,  cookies, cottage cheese, meat, vegetables, cheese, rice Cardamon-Baked goods, fruit, soups Celery Powder or seed-Salads, salad dressings, sauces, meatloaf, soup, bread.Do not use  celery salt Chervil-Meats, salads, fish, eggs, vegetables, cottage cheese (parsley-like flavor) Chili Power-Meatloaf, chicken cheese, corn, eggplant, egg dishes Chives-Salads cottage cheese, egg dishes, soups, vegetables, sauces Cilantro-Salsa, casseroles Cinnamon-Baked goods, fruit, pork, lamb, chicken, carrots Cloves-Fruit, baked goods, fish, pot roast, green beans, beets, carrots Coriander-Pastry, cookies, meat, salads, cheese (lemon-orange flavor) Cumin-Meatloaf, fish,cheese, eggs, cabbage,fruit pie (caraway flavor) [employer], fruit, eggs, fish, poultry, cottage cheese, vegetables Dill Seed-Meat, cottage cheese, poultry, vegetables, fish, salads, bread Fennel Seed-Bread, cookies, apples, pork, eggs, fish, beets, cabbage, cheese, Licorice-like flavor Garlic-(buds or powder) Salads, meat, poultry, fish, bread, butter, vegetables, potatoes.Do not  use garlic salt Ginger-Fruit, vegetables, baked goods, meat, fish, poultry Horseradish Root-Meet, vegetables, butter Lemon Juice or Extract-Vegetables, fruit, tea, baked goods, fish salads Mace-Baked goods fruit, vegetables, fish, poultry (taste like nutmeg) Maple Extract-Syrups Marjoram-Meat, chicken, fish, vegetables, breads, green salads (taste like Sage) Mint-Tea, lamb, sherbet, vegetables, desserts, carrots, cabbage Mustard, Dry or Seed-Cheese, eggs, meats, vegetables, poultry Nutmeg-Baked goods, fruit, chicken, eggs, vegetables, desserts Onion Powder-Meat, fish, poultry, vegetables, cheese, eggs, bread, rice salads (Do not use   Onion salt) Orange Extract-Desserts, baked goods Oregano-Pasta, eggs, cheese, onions, pork, lamb, fish, chicken, vegetables, green salads Paprika-Meat, fish, poultry, eggs, cheese, vegetables Parsley Flakes-Butter, vegetables,  meat fish, poultry, eggs, bread, salads (certain forms may   Contain sodium Pepper-Meat fish, poultry, vegetables, eggs Peppermint Extract-Desserts, baked goods Poppy Seed-Eggs, bread, cheese, fruit dressings, baked goods, noodles, vegetables, cottage  [employer], poultry, meat, fish, cauliflower, turnips,eggs bread Saffron-Rice, bread, veal, chicken, fish, eggs Sage-Meat, fish, poultry, onions, eggplant, tomateos, pork, stews Savory-Eggs, salads, poultry, meat, rice, vegetables, soups, pork Tarragon-Meat, poultry, fish, eggs, butter, vegetables (licorice-like flavor)  Thyme-Meat, poultry, fish, eggs, vegetables, (clover-like flavor), sauces, soups Tumeric-Salads, butter, eggs, fish, rice, vegetables (saffron-like flavor) Vanilla Extract-Baked goods, candy Vinegar-Salads, vegetables, meat marinades Walnut Extract-baked goods, candy  2. Choose your Foods Wisely   The following is a list of foods to avoid which are high in sodium:  Meats-Avoid all smoked, canned, salt cured, dried and kosher meat and fish as well as Anchovies   [employer] meats:Bologna, Liverwurst, Pastrami Canned meat or fish  Marinated herring Caviar    Pepperoni Corned Beef   Pizza Dried chipped beef  Salami Frozen breaded fish or meat Salt pork Frankfurters or hot dogs  Sardines Gefilte fish   Sausage Ham (boiled ham, Proscuitto Smoked butt    spiced ham)   Spam      TV Dinners Vegetables Canned vegetables (Regular) Relish Canned mushrooms  Sauerkraut Olives    Tomato juice Pickles  Bakery and Dessert Products Canned puddings  Cream pies Cheesecake   Decorated cakes Cookies  Beverages/Juices Tomato juice, regular  Gatorade   V-8 vegetable juice, regular  Breads and Cereals Biscuit mixes   Salted potato chips, corn chips, pretzels Bread stuffing mixes  Salted crackers and rolls Pancake  and waffle mixes Self-rising  flour  Seasonings Accent    Meat sauces Barbecue sauce  Meat tenderizer Catsup    Monosodium glutamate (MSG) Celery salt   Onion salt Chili sauce   Prepared mustard Garlic salt   Salt, seasoned salt, sea salt Gravy mixes   Soy sauce Horseradish   Steak sauce Ketchup   Tartar sauce Lite salt    Teriyaki sauce Marinade mixes   Worcestershire sauce  Others Baking powder   Cocoa and cocoa mixes Baking soda   Commercial casserole mixes Candy-caramels, chocolate  Dehydrated soups    Bars, fudge,nougats  Instant rice and pasta mixes Canned broth or soup  Maraschino cherries Cheese, aged and processed cheese and cheese spreads  Learning Assessment Quiz  Indicated T (for True) or F (for False) for each of the following statements:  1. _____ Fresh fruits and vegetables and unprocessed grains are generally low in sodium 2. _____ Water may contain a considerable amount of sodium, depending on the source 3. _____ You can always tell if a food is high in sodium by tasting it 4. _____ Certain laxatives my be high in sodium and should be avoided unless prescribed   by a physician or pharmacist 5. _____ Salt substitutes may be used freely by anyone on a sodium restricted diet 6. _____ Sodium is present in table salt, food additives and as a natural component of   most foods 7. _____ Table salt is approximately 90% sodium 8. _____ Limiting sodium intake may help prevent excess fluid accumulation in the body 9. _____ On a sodium-restricted diet, seasonings such as bouillon soy sauce, and    cooking wine should be used in place of table salt 10. _____ On an ingredient list, a product which lists monosodium glutamate as the first   ingredient is an appropriate food to include on a low sodium diet  Circle the best answer(s) to the following statements (Hint: there may be more than one correct answer)  11. On a low-sodium diet, some acceptable snack items are:    A. Olives  F. Bean dip   K.  Grapefruit juice    B. Salted Pretzels G. Commercial Popcorn   L. Canned peaches    C. Carrot Sticks  H. Bouillon   M. Unsalted nuts   D. [place] fries  I. Peanut butter crackers N. Salami   E. Sweet pickles J. Tomato Juice   O. Pizza  12.  Seasonings that may be used freely on a reduced - sodium diet include   A. Lemon wedges F.Monosodium glutamate K. Celery seed    B.Soysauce   G. Pepper   L. Mustard powder   C. Sea salt  H. Cooking wine  M. Onion flakes   D. Vinegar  E. Prepared horseradish N. Salsa   E. Sage   J. Worcestershire sauce  O. Chutney

## 2020-03-14 ENCOUNTER — Ambulatory Visit (INDEPENDENT_AMBULATORY_CARE_PROVIDER_SITE_OTHER): Payer: Medicare Other | Admitting: Gastroenterology

## 2020-03-14 ENCOUNTER — Encounter: Payer: Self-pay | Admitting: Gastroenterology

## 2020-03-14 VITALS — BP 120/64 | HR 64 | Ht 60.0 in | Wt 176.1 lb

## 2020-03-14 DIAGNOSIS — I2510 Atherosclerotic heart disease of native coronary artery without angina pectoris: Secondary | ICD-10-CM | POA: Diagnosis not present

## 2020-03-14 DIAGNOSIS — K589 Irritable bowel syndrome without diarrhea: Secondary | ICD-10-CM | POA: Diagnosis not present

## 2020-03-14 DIAGNOSIS — K2100 Gastro-esophageal reflux disease with esophagitis, without bleeding: Secondary | ICD-10-CM

## 2020-03-14 MED ORDER — PANTOPRAZOLE SODIUM 40 MG PO TBEC: 40.0000 mg | DELAYED_RELEASE_TABLET | Freq: Every day | ORAL | 3 refills | Status: DC

## 2020-03-14 MED ORDER — SUCRALFATE 1 G PO TABS: 1.0000 g | ORAL_TABLET | Freq: Two times a day (BID) | ORAL | 3 refills | Status: DC

## 2020-03-14 NOTE — Patient Instructions (Signed)
We have refilled carafate and protonix today  Follow up in 6 months  If you are age 78 or older, your body mass index should be between 23-30. Your Body mass index is 34.39 kg/m. If this is out of the aforementioned range listed, please consider follow up with your Primary Care Provider.  If you are age 2 or younger, your body mass index should be between 19-25. Your Body mass index is 34.39 kg/m. If this is out of the aformentioned range listed, please consider follow up with your Primary Care Provider.    I appreciate the  opportunity to care for you  Thank You   Harl Bowie , MD

## 2020-03-14 NOTE — Progress Notes (Signed)
Katelyn Taylor    [id number]    October 05, 1940  Primary Care Physician:Aura Dials, MD  Referring Physician: Aura Dials, [address] [place],  [place] [address]   Chief complaint:  IBS  HPI: 79 yr very pleasant female here for follow-up visit for GERD and irritable bowel syndrome EGD January 04, 2020: Erosive esophagitis and gastritis, negative for H. pylori  Colonoscopy January 04, 2020: 2 small sessile polyps [tubular adenomas], hemorrhoids  Overall her symptoms are significantly better.  She is no longer having diarrhea.  She is having 1-2 bowel movements per day with no incontinence.  She has intermittent epigastric abdominal discomfort that improves with Protonix and Carafate  Denies any melena, rectal bleeding, dysphagia, vomiting, decreased appetite or weight loss.  S/p cholecystectomy  Colonoscopy March 18, 2009 by Dr. Olevia Perches good prep, internal hemorrhoids, mild nonspecific erythema in sigmoid colon biopsies were taken, showed benign colonic mucosa based on path report.  EGD March 18, 2009 by Dr. Olevia Perches showed esophagitis and gastritis. Biopsies negative for Barrett's esophagus, H. pylori infection.  Outpatient Encounter Medications as of 03/14/2020  Medication Sig  . aspirin 81 MG chewable tablet Chew 81 mg by mouth daily.   . calcium carbonate (TUMS EX) 750 MG chewable tablet Chew 1 tablet by mouth daily as needed for heartburn.  . cetirizine (ZYRTEC) 10 MG tablet Take 10 mg by mouth daily.  . Cholecalciferol (VITAMIN D PO) Take 1 tablet by mouth daily.   . CYANOCOBALAMIN PO Take 1 tablet by mouth daily.  . diazepam (VALIUM) 10 MG tablet Take 1 tablet (10 mg total) by mouth as needed.  . fish oil-omega-3 fatty acids 1000 MG capsule Take 1 g by mouth daily.   . furosemide (LASIX) 40 MG tablet Take 40 mg by mouth daily.   Marland Kitchen lisinopril (PRINIVIL,ZESTRIL) 5 MG tablet Take 5 mg by mouth daily.  . Multiple Vitamins-Minerals (WOMENS MULTIVITAMIN PO) Take  by mouth.  . pantoprazole (PROTONIX) 40 MG tablet Take 1 tablet (40 mg total) by mouth daily.  . sodium chloride (OCEAN) 0.65 % SOLN nasal spray Place 2 sprays into both nostrils as needed for congestion.  . sucralfate (CARAFATE) 1 g tablet Take 1 tablet (1 g total) by mouth 2 (two) times daily.  Marland Kitchen VITAMIN E PO Take 1 tablet by mouth daily.   . [DISCONTINUED] metoprolol tartrate (LOPRESSOR) 50 MG tablet Take 1 tablet (50 mg total) by mouth once for 1 dose. Take 2 hours prior to your Coronary CT.   No facility-administered encounter medications on file as of 03/14/2020.    Allergies as of 03/14/2020 - Review Complete 03/14/2020  Allergen Reaction Noted  . Amoxicillin Other (See Comments)   . Cephalexin Other (See Comments) 01/28/2009  . Codeine Nausea And Vomiting 10/21/2010  . Nsaids Other (See Comments) 04/14/2013  . Penicillins Other (See Comments) 10/21/2010  . Sulfonamide derivatives Other (See Comments)     Past Medical History:  Diagnosis Date  . Allergic rhinitis   . Anticoagulated   . Anxiety   . Arthritis   . Back pain   . Cataracts, bilateral   . Chronic gastritis   . Clotting disorder ([place])   . Depression   . DVT (deep venous thrombosis) ([place])   . Estrogen deficiency   . External hemorrhoid   . Fatty liver   . GERD (gastroesophageal reflux disease)   . Hiatal hernia   . Hx of migraines   .  Hypercholesterolemia   . Hypertension   . IBS (irritable bowel syndrome)   . Neck pain   . Osteopenia 08/2014   T score -2.1 FRAX 13%/1.5%  . Panic attacks   . Severe dysplasia of cervix (CIN III)   . Shingles   . Stress   . Vitamin D deficiency     Past Surgical History:  Procedure Laterality Date  . CATARACT EXTRACTION, BILATERAL    . DIAGNOSTIC LAPAROSCOPIC LIVER BIOPSY  03/16/2013  . KNEE ARTHROSCOPY Right   . LAPAROSCOPIC CHOLECYSTECTOMY  03/16/2013  . TMJ ARTHROPLASTY    . TUBAL LIGATION    . VAGINAL HYSTERECTOMY  1979   Partial for high-grade dysplasia and  dysfunctional bleeding historically    Family History  Problem Relation Age of Onset  . Melanoma Mother   . Stroke Mother   . Heart disease Mother   . Cancer Mother        melonoma  . Heart disease Father   . Breast cancer Sister 67  . Cancer Sister        breast  . Lung cancer Brother   . Cancer Brother        throat, lung  . Breast cancer Maternal Aunt        Age unknown  . Heart disease Sister   . Throat cancer Brother   . Ovarian cancer Other        aunt  . Colon cancer Neg Hx   . Rectal cancer Neg Hx   . Stomach cancer Neg Hx     Social History   Socioeconomic History  . Marital status: Married    Spouse name: Not on file  . Number of children: 3  . Years of education: Not on file  . Highest education level: Not on file  Occupational History  . Occupation: Arboriculturist: RETIRED  . Occupation: caregiver  Tobacco Use  . Smoking status: Former Smoker    Types: Cigarettes  . Smokeless tobacco: Never Used  Vaping Use  . Vaping Use: Never used  Substance and Sexual Activity  . Alcohol use: No  . Drug use: No  . Sexual activity: Never    Birth control/protection: Surgical    Comment: 1st encounter over age 52; 3 partners lifetime  Other Topics Concern  . Not on file  Social History Narrative   Lives home with husband, son, grandson.  Children 3.    Social Determinants of Health   Financial Resource Strain:   . Difficulty of Paying Living Expenses: Not on file  Food Insecurity:   . Worried About Charity fundraiser in the Last Year: Not on file  . Ran Out of Food in the Last Year: Not on file  Transportation Needs:   . Lack of Transportation (Medical): Not on file  . Lack of Transportation (Non-Medical): Not on file  Physical Activity:   . Days of Exercise per Week: Not on file  . Minutes of Exercise per Session: Not on file  Stress:   . Feeling of Stress : Not on file  Social Connections:   . Frequency of Communication with Friends and  Family: Not on file  . Frequency of Social Gatherings with Friends and Family: Not on file  . Attends Religious Services: Not on file  . Active Member of Clubs or Organizations: Not on file  . Attends Archivist Meetings: Not on file  . Marital Status: Not on file  Intimate Partner Violence:   .  Fear of Current or Ex-Partner: Not on file  . Emotionally Abused: Not on file  . Physically Abused: Not on file  . Sexually Abused: Not on file      Review of systems: All other review of systems negative except as mentioned in the HPI.   Physical Exam: Vitals:   03/14/20 0941  BP: 120/64  Pulse: 64   Body mass index is 34.39 kg/m. Gen:      No acute distress HEENT:  sclera anicteric Abd:      soft, non-tender; no palpable masses, no distension Ext:    No edema Neuro: alert and oriented x 3 Psych: normal mood and affect  Data Reviewed:  Reviewed labs, radiology imaging, old records and pertinent past GI work up   Assessment and Plan/Recommendations:  79 year old very pleasant female with history of GERD with erosive esophagitis and irritable bowel syndrome  Overall symptoms are stable and well controlled  GERD: Continue Protonix 40 mg daily Antireflux measures  Dyspepsia and epigastric discomfort secondary to gastroduodenitis, improves with Carafate.  Okay to use as needed Small frequent meals  History of fatty liver and obesity.  Continue to limit high-calorie diet with high carbohydrates or processed foods.  Exercise  Diarrhea improved  History of adenomatous colon polyps s/p removal, no recall due to age  Return in 6 months or sooner if needed  This visit required 45 minutes of patient care (this includes precharting, chart review, review of results, face-to-face time used for counseling as well as treatment plan and follow-up. The patient was provided an opportunity to ask questions and all were answered. The patient agreed with the plan and  demonstrated an understanding of the instructions.  Damaris Hippo , MD    CC: Aura Dials, MD

## 2020-04-04 ENCOUNTER — Ambulatory Visit
Admission: RE | Admit: 2020-04-04 | Discharge: 2020-04-04 | Disposition: A | Discharge status: Home / Self Care | Payer: Medicare Other | Source: Ambulatory Visit | Attending: Cardiology | Admitting: Cardiology

## 2020-04-04 DIAGNOSIS — J189 Pneumonia, unspecified organism: Secondary | ICD-10-CM

## 2020-04-04 DIAGNOSIS — K219 Gastro-esophageal reflux disease without esophagitis: Secondary | ICD-10-CM | POA: Diagnosis not present

## 2020-04-04 DIAGNOSIS — J984 Other disorders of lung: Secondary | ICD-10-CM | POA: Diagnosis not present

## 2020-04-04 DIAGNOSIS — J0110 Acute frontal sinusitis, unspecified: Secondary | ICD-10-CM | POA: Diagnosis not present

## 2020-04-04 DIAGNOSIS — I700 Atherosclerosis of aorta: Secondary | ICD-10-CM | POA: Diagnosis not present

## 2020-04-04 DIAGNOSIS — R9389 Abnormal findings on diagnostic imaging of other specified body structures: Secondary | ICD-10-CM

## 2020-04-04 DIAGNOSIS — I10 Essential (primary) hypertension: Secondary | ICD-10-CM | POA: Diagnosis not present

## 2020-04-04 DIAGNOSIS — R911 Solitary pulmonary nodule: Secondary | ICD-10-CM

## 2020-04-04 DIAGNOSIS — J9811 Atelectasis: Secondary | ICD-10-CM | POA: Diagnosis not present

## 2020-04-04 DIAGNOSIS — I2510 Atherosclerotic heart disease of native coronary artery without angina pectoris: Secondary | ICD-10-CM | POA: Diagnosis not present

## 2020-04-04 DIAGNOSIS — F419 Anxiety disorder, unspecified: Secondary | ICD-10-CM | POA: Diagnosis not present

## 2020-04-11 ENCOUNTER — Telehealth: Payer: Self-pay | Admitting: *Deleted

## 2020-04-11 DIAGNOSIS — R911 Solitary pulmonary nodule: Secondary | ICD-10-CM

## 2020-04-11 DIAGNOSIS — R9389 Abnormal findings on diagnostic imaging of other specified body structures: Secondary | ICD-10-CM

## 2020-04-11 NOTE — Telephone Encounter (Signed)
Spoke with the pt and informed her of Chest CT results and recommendations per Dr. Meda Coffee.  Informed the pt that per Dr. Meda Coffee, we will order for her to have a repeat Chest CT WO Contrast to be done in 3-6 months, for follow-up of nodule at left lung base, and right upper lobe sub solid nodule.  Informed the pt that I will place the order in the system and send a message to our [facility] schedulers to call her back and arrange this for 3-6 months out. Pt verbalized understanding and agrees with this plan.

## 2020-04-11 NOTE — Telephone Encounter (Signed)
Dorothy Spark, MD  04/07/2020 10:36 AM EDT     1. Nodule at the LEFT lung base - stable, most probably representing scarring. 2. Sub solid nodule in the RIGHT upper lobe measuring 4 x 4 mm. Please schedule repeat chest CT without contrast in 3 to 6 months.

## 2020-04-22 DIAGNOSIS — M25775 Osteophyte, left foot: Secondary | ICD-10-CM | POA: Diagnosis not present

## 2020-04-22 DIAGNOSIS — L600 Ingrowing nail: Secondary | ICD-10-CM | POA: Diagnosis not present

## 2020-04-22 DIAGNOSIS — M25774 Osteophyte, right foot: Secondary | ICD-10-CM | POA: Diagnosis not present

## 2020-06-25 DIAGNOSIS — L820 Inflamed seborrheic keratosis: Secondary | ICD-10-CM | POA: Diagnosis not present

## 2020-06-25 DIAGNOSIS — D2271 Melanocytic nevi of right lower limb, including hip: Secondary | ICD-10-CM | POA: Diagnosis not present

## 2020-06-25 DIAGNOSIS — L821 Other seborrheic keratosis: Secondary | ICD-10-CM | POA: Diagnosis not present

## 2020-06-25 DIAGNOSIS — L723 Sebaceous cyst: Secondary | ICD-10-CM | POA: Diagnosis not present

## 2020-06-25 DIAGNOSIS — D1801 Hemangioma of skin and subcutaneous tissue: Secondary | ICD-10-CM | POA: Diagnosis not present

## 2020-06-25 DIAGNOSIS — D225 Melanocytic nevi of trunk: Secondary | ICD-10-CM | POA: Diagnosis not present

## 2020-06-25 DIAGNOSIS — D485 Neoplasm of uncertain behavior of skin: Secondary | ICD-10-CM | POA: Diagnosis not present

## 2020-06-25 DIAGNOSIS — C44311 Basal cell carcinoma of skin of nose: Secondary | ICD-10-CM | POA: Diagnosis not present

## 2020-06-25 DIAGNOSIS — L814 Other melanin hyperpigmentation: Secondary | ICD-10-CM | POA: Diagnosis not present

## 2020-06-25 DIAGNOSIS — L570 Actinic keratosis: Secondary | ICD-10-CM | POA: Diagnosis not present

## 2020-07-03 ENCOUNTER — Other Ambulatory Visit: Payer: Medicare Other

## 2020-07-07 ENCOUNTER — Telehealth: Payer: Self-pay | Admitting: *Deleted

## 2020-07-07 ENCOUNTER — Ambulatory Visit
Admission: RE | Admit: 2020-07-07 | Discharge: 2020-07-07 | Disposition: A | Discharge status: Home / Self Care | Payer: Medicare Other | Source: Ambulatory Visit | Attending: Cardiology | Admitting: Cardiology

## 2020-07-07 DIAGNOSIS — R911 Solitary pulmonary nodule: Secondary | ICD-10-CM

## 2020-07-07 DIAGNOSIS — J984 Other disorders of lung: Secondary | ICD-10-CM

## 2020-07-07 DIAGNOSIS — R918 Other nonspecific abnormal finding of lung field: Secondary | ICD-10-CM | POA: Diagnosis not present

## 2020-07-07 DIAGNOSIS — R9389 Abnormal findings on diagnostic imaging of other specified body structures: Secondary | ICD-10-CM

## 2020-07-07 NOTE — Telephone Encounter (Signed)
-----   Message from Dorothy Spark, MD sent at 07/07/2020  2:05 PM EST ----- Stable 10 mm posterior left lower lobe pulmonary nodule, most likely scarring. Follow-up CT chest without contrast in 6-12 months could be used to ensure continued stability

## 2020-07-07 NOTE — Telephone Encounter (Signed)
Spoke with the pt and endorsed to her, her Chest CT results and recommendations per Dr. Meda Coffee, for her to have this repeated again in 6 months, to ensure continued stability.  Informed the pt that I will place the order in the system and send a message to our [facility] schedulers to call her back and arrange this test to be done again in 6 months, at same location.  Pt verbalized understanding and agrees with this plan.

## 2020-07-08 DIAGNOSIS — C44311 Basal cell carcinoma of skin of nose: Secondary | ICD-10-CM | POA: Diagnosis not present

## 2020-07-08 DIAGNOSIS — E782 Mixed hyperlipidemia: Secondary | ICD-10-CM | POA: Diagnosis not present

## 2020-07-08 DIAGNOSIS — I10 Essential (primary) hypertension: Secondary | ICD-10-CM | POA: Diagnosis not present

## 2020-07-08 DIAGNOSIS — I2510 Atherosclerotic heart disease of native coronary artery without angina pectoris: Secondary | ICD-10-CM | POA: Diagnosis not present

## 2020-07-08 DIAGNOSIS — R911 Solitary pulmonary nodule: Secondary | ICD-10-CM | POA: Diagnosis not present

## 2020-07-08 DIAGNOSIS — E559 Vitamin D deficiency, unspecified: Secondary | ICD-10-CM | POA: Diagnosis not present

## 2020-07-08 DIAGNOSIS — F419 Anxiety disorder, unspecified: Secondary | ICD-10-CM | POA: Diagnosis not present

## 2020-07-08 DIAGNOSIS — R350 Frequency of micturition: Secondary | ICD-10-CM | POA: Diagnosis not present

## 2020-07-08 DIAGNOSIS — K219 Gastro-esophageal reflux disease without esophagitis: Secondary | ICD-10-CM | POA: Diagnosis not present

## 2020-07-22 DIAGNOSIS — C44311 Basal cell carcinoma of skin of nose: Secondary | ICD-10-CM | POA: Diagnosis not present

## 2020-07-22 DIAGNOSIS — Z85828 Personal history of other malignant neoplasm of skin: Secondary | ICD-10-CM | POA: Diagnosis not present

## 2020-07-30 DIAGNOSIS — Z85828 Personal history of other malignant neoplasm of skin: Secondary | ICD-10-CM | POA: Diagnosis not present

## 2020-07-30 DIAGNOSIS — Z4802 Encounter for removal of sutures: Secondary | ICD-10-CM | POA: Diagnosis not present

## 2020-07-30 DIAGNOSIS — L720 Epidermal cyst: Secondary | ICD-10-CM | POA: Diagnosis not present

## 2020-07-30 DIAGNOSIS — D239 Other benign neoplasm of skin, unspecified: Secondary | ICD-10-CM | POA: Diagnosis not present

## 2020-08-12 DIAGNOSIS — I10 Essential (primary) hypertension: Secondary | ICD-10-CM | POA: Diagnosis not present

## 2020-08-12 DIAGNOSIS — F419 Anxiety disorder, unspecified: Secondary | ICD-10-CM | POA: Diagnosis not present

## 2020-08-28 NOTE — Telephone Encounter (Signed)
Spoke with the pt and discussed who will be taking over her care, being Dr. Meda Coffee is leaving.  Informed her that she will be establishing care with Dr. Johney Frame.  Cancelled the pts appt with Dr. Meda Coffee for May, and rescheduled her to see Dr. Johney Frame, for 6 month follow-up on 10/21/20 at [place] [place].  Informed the pt that she will proceed with getting her scheduled CT done in the summer.  Pt verbalized understanding and agrees with this plan.

## 2020-09-03 DIAGNOSIS — Z961 Presence of intraocular lens: Secondary | ICD-10-CM | POA: Diagnosis not present

## 2020-09-03 DIAGNOSIS — H538 Other visual disturbances: Secondary | ICD-10-CM | POA: Diagnosis not present

## 2020-09-03 DIAGNOSIS — Z9889 Other specified postprocedural states: Secondary | ICD-10-CM | POA: Diagnosis not present

## 2020-09-10 DIAGNOSIS — J40 Bronchitis, not specified as acute or chronic: Secondary | ICD-10-CM | POA: Diagnosis not present

## 2020-09-26 DIAGNOSIS — Z1231 Encounter for screening mammogram for malignant neoplasm of breast: Secondary | ICD-10-CM | POA: Diagnosis not present

## 2020-10-07 DIAGNOSIS — H5319 Other subjective visual disturbances: Secondary | ICD-10-CM | POA: Diagnosis not present

## 2020-10-07 DIAGNOSIS — Z79899 Other long term (current) drug therapy: Secondary | ICD-10-CM | POA: Diagnosis not present

## 2020-10-07 DIAGNOSIS — H04123 Dry eye syndrome of bilateral lacrimal glands: Secondary | ICD-10-CM | POA: Diagnosis not present

## 2020-10-07 DIAGNOSIS — H26493 Other secondary cataract, bilateral: Secondary | ICD-10-CM | POA: Diagnosis not present

## 2020-10-07 DIAGNOSIS — Z961 Presence of intraocular lens: Secondary | ICD-10-CM | POA: Diagnosis not present

## 2020-10-10 DIAGNOSIS — F419 Anxiety disorder, unspecified: Secondary | ICD-10-CM | POA: Diagnosis not present

## 2020-10-10 DIAGNOSIS — E782 Mixed hyperlipidemia: Secondary | ICD-10-CM | POA: Diagnosis not present

## 2020-10-10 DIAGNOSIS — I2510 Atherosclerotic heart disease of native coronary artery without angina pectoris: Secondary | ICD-10-CM | POA: Diagnosis not present

## 2020-10-10 DIAGNOSIS — I10 Essential (primary) hypertension: Secondary | ICD-10-CM | POA: Diagnosis not present

## 2020-10-10 DIAGNOSIS — M6740 Ganglion, unspecified site: Secondary | ICD-10-CM | POA: Diagnosis not present

## 2020-10-10 DIAGNOSIS — K760 Fatty (change of) liver, not elsewhere classified: Secondary | ICD-10-CM | POA: Diagnosis not present

## 2020-10-15 DIAGNOSIS — Z6830 Body mass index (BMI) 30.0-30.9, adult: Secondary | ICD-10-CM | POA: Diagnosis not present

## 2020-10-15 DIAGNOSIS — Z124 Encounter for screening for malignant neoplasm of cervix: Secondary | ICD-10-CM | POA: Diagnosis not present

## 2020-10-16 ENCOUNTER — Ambulatory Visit: Payer: Medicare Other | Admitting: Cardiology

## 2020-10-17 NOTE — Progress Notes (Signed)
Cardiology Office Note:    Date:  10/21/2020   ID:  Katelyn Taylor, DOB 10-Oct-1940, MRN [id number]  PCP:  Aura Dials, MD   [facility] Providers Cardiologist:  Ena Dawley, MD (Inactive) {   Referring MD: Aura Dials, MD    History of Present Illness:    Katelyn Taylor is a 80 y.o. female with a hx of HTN, HLD and family history of CAD who was previously followed by Dr. Meda Coffee who now returns to clinic for follow-up.  She saw Dr. Meda Coffee in 2021 for chest pain. Coronary CTA 01/01/20 showed calcium score of 8, minimal nonobstructive CAD 0 to 24% in the proximal RCA and distal left main. Chest pain felt to be noncardiac.  Also complained of orthostatic hypotension symptoms and TTE was ordered which showed normal LV function 60 to 65% with moderate concentric LVH and grade 1 DD.   Last saw Ermalinda Barrios in 01/2020 where she was very stressed as she lost her husband the year prior.   Today, the patient states she is doing better. It has been 2 years since her husband has passed, but she is overall improving. She has been working on lifestyle moiifications and has lost a significant amount of weight since her last visit (23lbs). No chest pain, SOB, LE edema, palpitations or orthopnea. Blood pressure is well controlled. Taking all medications as prescribed.  Past Medical History:  Diagnosis Date  . Allergic rhinitis   . Anticoagulated   . Anxiety   . Arthritis   . Back pain   . Cataracts, bilateral   . Chronic gastritis   . Clotting disorder ([place])   . Depression   . DVT (deep venous thrombosis) ([place])   . Estrogen deficiency   . External hemorrhoid   . Fatty liver   . GERD (gastroesophageal reflux disease)   . Hiatal hernia   . Hx of migraines   . Hypercholesterolemia   . Hypertension   . IBS (irritable bowel syndrome)   . Neck pain   . Osteopenia 08/2014   T score -2.1 FRAX 13%/1.5%  . Panic attacks   . Severe dysplasia of cervix (CIN III)   . Shingles   . Stress    . Vitamin D deficiency     Past Surgical History:  Procedure Laterality Date  . CATARACT EXTRACTION, BILATERAL    . DIAGNOSTIC LAPAROSCOPIC LIVER BIOPSY  03/16/2013  . KNEE ARTHROSCOPY Right   . LAPAROSCOPIC CHOLECYSTECTOMY  03/16/2013  . TMJ ARTHROPLASTY    . TUBAL LIGATION    . VAGINAL HYSTERECTOMY  1979   Partial for high-grade dysplasia and dysfunctional bleeding historically    Current Medications: Current Meds  Medication Sig  . aspirin 81 MG chewable tablet Chew 81 mg by mouth daily.  . calcium carbonate (TUMS EX) 750 MG chewable tablet Chew 1 tablet by mouth daily as needed for heartburn.  . cetirizine (ZYRTEC) 10 MG tablet Take 10 mg by mouth daily.  . Cholecalciferol (VITAMIN D PO) Take 1 tablet by mouth every other day.  . CYANOCOBALAMIN PO Take 1 tablet by mouth daily.  . diazepam (VALIUM) 10 MG tablet Take 1 tablet (10 mg total) by mouth as needed.  . fish oil-omega-3 fatty acids 1000 MG capsule Take 1 g by mouth daily.  . furosemide (LASIX) 40 MG tablet Take 40 mg by mouth daily.   Marland Kitchen lisinopril (PRINIVIL,ZESTRIL) 5 MG tablet Take 5 mg by mouth daily.  . Multiple Vitamins-Minerals (WOMENS MULTIVITAMIN PO) Take  by mouth.  . sodium chloride (OCEAN) 0.65 % SOLN nasal spray Place 2 sprays into both nostrils as needed for congestion.  . sucralfate (CARAFATE) 1 g tablet Take 1 g by mouth as needed.  Marland Kitchen VITAMIN E PO Take 1 tablet by mouth daily.     Allergies:   Amoxicillin, Cephalexin, Codeine, Nsaids, Penicillins, and Sulfonamide derivatives   Social History   Socioeconomic History  . Marital status: Married    Spouse name: Not on file  . Number of children: 3  . Years of education: Not on file  . Highest education level: Not on file  Occupational History  . Occupation: Arboriculturist: RETIRED  . Occupation: caregiver  Tobacco Use  . Smoking status: Former Smoker    Types: Cigarettes  . Smokeless tobacco: Never Used  Vaping Use  . Vaping Use: Never  used  Substance and Sexual Activity  . Alcohol use: No  . Drug use: No  . Sexual activity: Never    Birth control/protection: Surgical    Comment: 1st encounter over age 37; 3 partners lifetime  Other Topics Concern  . Not on file  Social History Narrative   Lives home with husband, son, grandson.  Children 3.    Social Determinants of Health   Financial Resource Strain: Not on file  Food Insecurity: Not on file  Transportation Needs: Not on file  Physical Activity: Not on file  Stress: Not on file  Social Connections: Not on file     Family History: The patient's family history includes Breast cancer in her maternal aunt; Breast cancer (age of onset: 46) in her sister; Cancer in her brother, mother, and sister; Heart disease in her father, mother, and sister; Lung cancer in her brother; Melanoma in her mother; Ovarian cancer in an other family member; Stroke in her mother; Throat cancer in her brother. There is no history of Colon cancer, Rectal cancer, or Stomach cancer.  ROS:   Please see the history of present illness.    Review of Systems  Constitutional: Negative for chills and fever.  HENT: Negative for hearing loss.   Eyes: Negative for blurred vision and redness.  Respiratory: Negative for shortness of breath.   Cardiovascular: Negative for chest pain, palpitations, orthopnea, claudication, leg swelling and PND.  Gastrointestinal: Negative for melena and nausea.  Genitourinary: Negative for dysuria and flank pain.  Musculoskeletal: Negative for myalgias.  Neurological: Negative for dizziness and loss of consciousness.  Endo/Heme/Allergies: Negative for polydipsia.  Psychiatric/Behavioral: Negative for substance abuse.    EKGs/Labs/Other Studies Reviewed:    The following studies were reviewed today: Coronary CTA 12/31/2019 IMPRESSION: 1. Coronary calcium score of 8. This was 21 percentile for age and sex matched control.  2. Normal coronary origin with right  dominance.  3. CAD-RADS 1. Minimal non-obstructive CAD (0-24%) in the proximal RCA and distal left main. Consider non-atherosclerotic causes of chest pain. Consider preventive therapy and risk factor modification.   Electronically Signed By: Ena Dawley On: 01/01/2020 14:49  2D echo 7/16/2021IMPRESSIONS  1. Left ventricular ejection fraction, by estimation, is 60 to 65%. The  left ventricle has normal function. The left ventricle has no regional  wall motion abnormalities. There is moderate concentric left ventricular  hypertrophy. Left ventricular  diastolic parameters are consistent with Grade I diastolic dysfunction  (impaired relaxation).  2. Right ventricular systolic function is normal. The right ventricular  size is normal. Tricuspid regurgitation signal is inadequate for assessing  PA pressure.  3. The mitral valve is normal in structure. Trivial mitral valve  regurgitation. No evidence of mitral stenosis.  4. The aortic valve is tricuspid. Aortic valve regurgitation is mild. No  aortic stenosis is present.  5. The inferior vena cava is dilated in size with <50% respiratory  variability, suggesting right atrial pressure of 15 mmHg.   FINDINGS  Left Ventricle: Left ventricular ejection fraction, by estimation, is 60  to 65%. The left ventricle has normal function. The left ventricle has no  regional wall motion abnormalities. The left ventricular internal cavity  size was normal in size. There is  moderate concentric left ventricular hypertrophy. Left ventricular  diastolic parameters are consistent with Grade I diastolic dysfunction  (impaired relaxation). Normal left ventricular filling pressure.   Right Ventricle: The right ventricular size is normal. No increase in  right ventricular wall thickness. Right ventricular systolic function is  normal. Tricuspid regurgitation signal is inadequate for assessing PA  pressure.   Left Atrium: Left  atrial size was normal in size.   Right Atrium: Right atrial size was normal in size.   Pericardium: There is no evidence of pericardial effusion.   Mitral Valve: The mitral valve is normal in structure. Normal mobility of  the mitral valve leaflets. Trivial mitral valve regurgitation. No evidence  of mitral valve stenosis.   Tricuspid Valve: The tricuspid valve is normal in structure. Tricuspid  valve regurgitation is not demonstrated. No evidence of tricuspid  stenosis.   Aortic Valve: The aortic valve is tricuspid. Aortic valve regurgitation is  mild. No aortic stenosis is present.   Pulmonic Valve: The pulmonic valve was normal in structure. Pulmonic valve  regurgitation is trivial. No evidence of pulmonic stenosis.   Aorta: The aortic root is normal in size and structure.   Venous: The inferior vena cava is dilated in size with less than 50%  respiratory variability, suggesting right atrial pressure of 15 mmHg.   IAS/Shunts: No atrial level shunt detected by color flow Doppler.    EKG:  EKG is  ordered today.  The ekg ordered today demonstrates NSR with HR 62  Recent Labs: 12/26/2019: BUN 11; Creatinine, Ser 0.67; Potassium 4.3; Sodium 141  Recent Lipid Panel    Component Value Date/Time   CHOL 135 02/22/2012 1051   TRIG 78 02/22/2012 1051   HDL 57 02/22/2012 1051   CHOLHDL 2.4 02/22/2012 1051   VLDL 16 02/22/2012 1051   LDLCALC 62 02/22/2012 1051      Physical Exam:    VS:  BP 118/64   Pulse 61   Ht 5' (1.524 m)   Wt 153 lb 3.2 oz (69.5 kg)   SpO2 97%   BMI 29.92 kg/m     Wt Readings from Last 3 Encounters:  10/21/20 153 lb 3.2 oz (69.5 kg)  03/14/20 176 lb 1.6 oz (79.9 kg)  02/05/20 174 lb (78.9 kg)     GEN:  Well nourished, well developed in no acute distress HEENT: Normal NECK: No JVD; No carotid bruits CARDIAC: RRR, no murmurs, rubs, gallops RESPIRATORY:  Clear to auscultation without rales, wheezing or rhonchi  ABDOMEN: Soft, non-tender,  non-distended MUSCULOSKELETAL:  No edema; No deformity  SKIN: Warm and dry NEUROLOGIC:  Alert and oriented x 3 PSYCHIATRIC:  Normal affect   ASSESSMENT:    1. Coronary artery disease involving native coronary artery of native heart without angina pectoris   2. Nodule of lower lobe of left lung   3. Essential hypertension   4.  Hyperlipidemia, unspecified hyperlipidemia type   5. History of orthostatic hypotension   6. Family history of early CAD    PLAN:    In order of problems listed above:  #Mild Nonobstructive CAD: #Family history of CAD: Calcium score of 8 which is 26% age and sex matched control, minimal nonobstructive CAD. Noted to have 0 to 24% stenosis in the proximal RCA and distal left main. -Continue risk factor modification -Continue ASA 81mg  daily  #HTN: Well controlled. -Continue lisinopril 5mg  daily  #HLD: LDL controlled.  -Continue weight loss  #Fatty Liver: -Continue lifestyle modifications  #Lung Nodule: 81mm nodule on CT chest. -Needs repeat CT scan in 12/05/2020 for monitoring   Medication Adjustments/Labs and Tests Ordered: Current medicines are reviewed at length with the patient today.  Concerns regarding medicines are outlined above.  Orders Placed This Encounter  Procedures  . EKG 12-Lead   No orders of the defined types were placed in this encounter.   Patient Instructions  Medication Instructions:   Your physician recommends that you continue on your current medications as directed. Please refer to the Current Medication list given to you today.  *If you need a refill on your cardiac medications before your next appointment, please call your pharmacy*   Follow-Up: At [facility], you and your health needs are our priority.  As part of our continuing mission to provide you with exceptional heart care, we have created designated Provider Care Teams.  These Care Teams include your primary Cardiologist (physician) and Advanced  Practice Providers (APPs -  Physician Assistants and Nurse Practitioners) who all work together to provide you with the care you need, when you need it.  We recommend signing up for the patient portal called "MyChart".  Sign up information is provided on this After Visit Summary.  MyChart is used to connect with patients for Virtual Visits (Telemedicine).  Patients are able to view lab/test results, encounter notes, upcoming appointments, etc.  Non-urgent messages can be sent to your provider as well.   To learn more about what you can do with MyChart, go to [email or web address].    Your next appointment:   1 year(s)  The format for your next appointment:   In Person  Provider:   Gwyndolyn Kaufman, MD        Signed, Freada Bergeron, MD  10/21/2020 12:57 PM    [place]

## 2020-10-21 ENCOUNTER — Other Ambulatory Visit: Payer: Self-pay

## 2020-10-21 ENCOUNTER — Ambulatory Visit (INDEPENDENT_AMBULATORY_CARE_PROVIDER_SITE_OTHER): Payer: Medicare Other | Admitting: Cardiology

## 2020-10-21 ENCOUNTER — Encounter: Payer: Self-pay | Admitting: Cardiology

## 2020-10-21 VITALS — BP 118/64 | HR 61 | Ht 60.0 in | Wt 153.2 lb

## 2020-10-21 DIAGNOSIS — I10 Essential (primary) hypertension: Secondary | ICD-10-CM

## 2020-10-21 DIAGNOSIS — E785 Hyperlipidemia, unspecified: Secondary | ICD-10-CM | POA: Diagnosis not present

## 2020-10-21 DIAGNOSIS — I2510 Atherosclerotic heart disease of native coronary artery without angina pectoris: Secondary | ICD-10-CM | POA: Diagnosis not present

## 2020-10-21 DIAGNOSIS — Z8679 Personal history of other diseases of the circulatory system: Secondary | ICD-10-CM

## 2020-10-21 DIAGNOSIS — Z8249 Family history of ischemic heart disease and other diseases of the circulatory system: Secondary | ICD-10-CM | POA: Diagnosis not present

## 2020-10-21 DIAGNOSIS — R911 Solitary pulmonary nodule: Secondary | ICD-10-CM

## 2020-10-21 NOTE — Patient Instructions (Signed)

## 2020-10-30 DIAGNOSIS — Z85828 Personal history of other malignant neoplasm of skin: Secondary | ICD-10-CM | POA: Diagnosis not present

## 2020-10-30 DIAGNOSIS — L820 Inflamed seborrheic keratosis: Secondary | ICD-10-CM | POA: Diagnosis not present

## 2020-10-30 DIAGNOSIS — I788 Other diseases of capillaries: Secondary | ICD-10-CM | POA: Diagnosis not present

## 2020-11-19 DIAGNOSIS — H538 Other visual disturbances: Secondary | ICD-10-CM | POA: Diagnosis not present

## 2020-11-19 DIAGNOSIS — Z961 Presence of intraocular lens: Secondary | ICD-10-CM | POA: Diagnosis not present

## 2020-11-19 DIAGNOSIS — H26493 Other secondary cataract, bilateral: Secondary | ICD-10-CM | POA: Diagnosis not present

## 2020-11-19 DIAGNOSIS — H3589 Other specified retinal disorders: Secondary | ICD-10-CM | POA: Diagnosis not present

## 2020-12-03 DIAGNOSIS — Z7952 Long term (current) use of systemic steroids: Secondary | ICD-10-CM | POA: Diagnosis not present

## 2020-12-03 DIAGNOSIS — Z961 Presence of intraocular lens: Secondary | ICD-10-CM | POA: Diagnosis not present

## 2020-12-03 DIAGNOSIS — H26493 Other secondary cataract, bilateral: Secondary | ICD-10-CM | POA: Diagnosis not present

## 2020-12-16 DIAGNOSIS — R911 Solitary pulmonary nodule: Secondary | ICD-10-CM | POA: Diagnosis not present

## 2020-12-16 DIAGNOSIS — K760 Fatty (change of) liver, not elsewhere classified: Secondary | ICD-10-CM | POA: Diagnosis not present

## 2020-12-16 DIAGNOSIS — F419 Anxiety disorder, unspecified: Secondary | ICD-10-CM | POA: Diagnosis not present

## 2020-12-16 DIAGNOSIS — I10 Essential (primary) hypertension: Secondary | ICD-10-CM | POA: Diagnosis not present

## 2020-12-16 DIAGNOSIS — I2510 Atherosclerotic heart disease of native coronary artery without angina pectoris: Secondary | ICD-10-CM | POA: Diagnosis not present

## 2020-12-16 DIAGNOSIS — E782 Mixed hyperlipidemia: Secondary | ICD-10-CM | POA: Diagnosis not present

## 2020-12-16 DIAGNOSIS — K219 Gastro-esophageal reflux disease without esophagitis: Secondary | ICD-10-CM | POA: Diagnosis not present

## 2020-12-16 DIAGNOSIS — R300 Dysuria: Secondary | ICD-10-CM | POA: Diagnosis not present

## 2020-12-29 ENCOUNTER — Other Ambulatory Visit: Payer: Self-pay | Admitting: *Deleted

## 2020-12-29 ENCOUNTER — Other Ambulatory Visit: Payer: Self-pay

## 2020-12-29 ENCOUNTER — Other Ambulatory Visit: Payer: Medicare Other | Admitting: *Deleted

## 2020-12-29 DIAGNOSIS — R911 Solitary pulmonary nodule: Secondary | ICD-10-CM

## 2020-12-29 LAB — BASIC METABOLIC PANEL
BUN/Creatinine Ratio: 22 (ref 12–28)
BUN: 16 mg/dL (ref 8–27)
CO2: 32 mmol/L — ABNORMAL HIGH (ref 20–29)
Calcium: 10.1 mg/dL (ref 8.7–10.3)
Chloride: 101 mmol/L (ref 96–106)
Creatinine, Ser: 0.72 mg/dL (ref 0.57–1.00)
Glucose: 91 mg/dL (ref 65–99)
Potassium: 4.6 mmol/L (ref 3.5–5.2)
Sodium: 140 mmol/L (ref 134–144)
eGFR: 86 mL/min/{1.73_m2} (ref >59)

## 2021-01-05 ENCOUNTER — Other Ambulatory Visit: Payer: Medicare Other

## 2021-01-12 ENCOUNTER — Other Ambulatory Visit: Payer: Medicare Other

## 2021-01-13 DIAGNOSIS — K130 Diseases of lips: Secondary | ICD-10-CM | POA: Diagnosis not present

## 2021-01-13 DIAGNOSIS — B370 Candidal stomatitis: Secondary | ICD-10-CM | POA: Diagnosis not present

## 2021-01-13 DIAGNOSIS — R911 Solitary pulmonary nodule: Secondary | ICD-10-CM | POA: Diagnosis not present

## 2021-01-17 ENCOUNTER — Ambulatory Visit
Admission: RE | Admit: 2021-01-17 | Discharge: 2021-01-17 | Disposition: A | Discharge status: Home / Self Care | Payer: Medicare Other | Source: Ambulatory Visit | Attending: Cardiology | Admitting: Cardiology

## 2021-01-17 ENCOUNTER — Other Ambulatory Visit: Payer: Self-pay

## 2021-01-17 DIAGNOSIS — J984 Other disorders of lung: Secondary | ICD-10-CM

## 2021-01-17 DIAGNOSIS — R911 Solitary pulmonary nodule: Secondary | ICD-10-CM | POA: Diagnosis not present

## 2021-01-17 DIAGNOSIS — I700 Atherosclerosis of aorta: Secondary | ICD-10-CM | POA: Diagnosis not present

## 2021-01-17 DIAGNOSIS — R918 Other nonspecific abnormal finding of lung field: Secondary | ICD-10-CM | POA: Diagnosis not present

## 2021-01-27 DIAGNOSIS — H5789 Other specified disorders of eye and adnexa: Secondary | ICD-10-CM | POA: Diagnosis not present

## 2021-01-27 DIAGNOSIS — H0289 Other specified disorders of eyelid: Secondary | ICD-10-CM | POA: Diagnosis not present

## 2021-01-27 DIAGNOSIS — H26493 Other secondary cataract, bilateral: Secondary | ICD-10-CM | POA: Diagnosis not present

## 2021-01-27 DIAGNOSIS — H359 Unspecified retinal disorder: Secondary | ICD-10-CM | POA: Diagnosis not present

## 2021-01-28 ENCOUNTER — Other Ambulatory Visit: Payer: Medicare Other

## 2021-01-30 DIAGNOSIS — N2889 Other specified disorders of kidney and ureter: Secondary | ICD-10-CM | POA: Diagnosis not present

## 2021-01-30 DIAGNOSIS — R911 Solitary pulmonary nodule: Secondary | ICD-10-CM | POA: Diagnosis not present

## 2021-01-30 DIAGNOSIS — J40 Bronchitis, not specified as acute or chronic: Secondary | ICD-10-CM | POA: Diagnosis not present

## 2021-02-03 ENCOUNTER — Other Ambulatory Visit: Payer: Self-pay | Admitting: Family Medicine

## 2021-02-03 DIAGNOSIS — N2889 Other specified disorders of kidney and ureter: Secondary | ICD-10-CM

## 2021-02-05 ENCOUNTER — Ambulatory Visit
Admission: RE | Admit: 2021-02-05 | Discharge: 2021-02-05 | Disposition: A | Discharge status: Home / Self Care | Payer: Medicare Other | Source: Ambulatory Visit | Attending: Family Medicine | Admitting: Family Medicine

## 2021-02-05 ENCOUNTER — Other Ambulatory Visit: Payer: Self-pay

## 2021-02-05 DIAGNOSIS — N2889 Other specified disorders of kidney and ureter: Secondary | ICD-10-CM

## 2021-02-05 DIAGNOSIS — N281 Cyst of kidney, acquired: Secondary | ICD-10-CM | POA: Diagnosis not present

## 2021-02-10 DIAGNOSIS — H26493 Other secondary cataract, bilateral: Secondary | ICD-10-CM | POA: Diagnosis not present

## 2021-02-18 DIAGNOSIS — R911 Solitary pulmonary nodule: Secondary | ICD-10-CM | POA: Diagnosis not present

## 2021-02-19 ENCOUNTER — Other Ambulatory Visit: Payer: Self-pay | Admitting: Family Medicine

## 2021-02-19 DIAGNOSIS — R911 Solitary pulmonary nodule: Secondary | ICD-10-CM

## 2021-02-24 DIAGNOSIS — H26493 Other secondary cataract, bilateral: Secondary | ICD-10-CM | POA: Diagnosis not present

## 2021-02-26 DIAGNOSIS — B351 Tinea unguium: Secondary | ICD-10-CM | POA: Diagnosis not present

## 2021-02-27 ENCOUNTER — Other Ambulatory Visit: Payer: Self-pay

## 2021-02-27 ENCOUNTER — Emergency Department (HOSPITAL_BASED_OUTPATIENT_CLINIC_OR_DEPARTMENT_OTHER): Payer: Medicare Other

## 2021-02-27 ENCOUNTER — Encounter (HOSPITAL_BASED_OUTPATIENT_CLINIC_OR_DEPARTMENT_OTHER): Payer: Self-pay | Admitting: *Deleted

## 2021-02-27 ENCOUNTER — Emergency Department (HOSPITAL_BASED_OUTPATIENT_CLINIC_OR_DEPARTMENT_OTHER)
Admission: EM | Admit: 2021-02-27 | Discharge: 2021-02-27 | Disposition: A | Discharge status: Home / Self Care | Payer: Medicare Other | Attending: Emergency Medicine | Admitting: Emergency Medicine

## 2021-02-27 DIAGNOSIS — I10 Essential (primary) hypertension: Secondary | ICD-10-CM | POA: Diagnosis not present

## 2021-02-27 DIAGNOSIS — M79604 Pain in right leg: Secondary | ICD-10-CM | POA: Diagnosis not present

## 2021-02-27 DIAGNOSIS — Z87891 Personal history of nicotine dependence: Secondary | ICD-10-CM | POA: Diagnosis not present

## 2021-02-27 DIAGNOSIS — X58XXXA Exposure to other specified factors, initial encounter: Secondary | ICD-10-CM | POA: Diagnosis not present

## 2021-02-27 DIAGNOSIS — Z7982 Long term (current) use of aspirin: Secondary | ICD-10-CM | POA: Insufficient documentation

## 2021-02-27 DIAGNOSIS — S86911A Strain of unspecified muscle(s) and tendon(s) at lower leg level, right leg, initial encounter: Secondary | ICD-10-CM | POA: Insufficient documentation

## 2021-02-27 DIAGNOSIS — F419 Anxiety disorder, unspecified: Secondary | ICD-10-CM | POA: Diagnosis not present

## 2021-02-27 DIAGNOSIS — S86811A Strain of other muscle(s) and tendon(s) at lower leg level, right leg, initial encounter: Secondary | ICD-10-CM

## 2021-02-27 DIAGNOSIS — M79661 Pain in right lower leg: Secondary | ICD-10-CM | POA: Diagnosis not present

## 2021-02-27 DIAGNOSIS — Z79899 Other long term (current) drug therapy: Secondary | ICD-10-CM | POA: Insufficient documentation

## 2021-02-27 DIAGNOSIS — S8991XA Unspecified injury of right lower leg, initial encounter: Secondary | ICD-10-CM | POA: Diagnosis present

## 2021-02-27 LAB — URINALYSIS, ROUTINE W REFLEX MICROSCOPIC
Bilirubin Urine: NEGATIVE
Glucose, UA: NEGATIVE mg/dL
Hgb urine dipstick: NEGATIVE
Ketones, ur: NEGATIVE mg/dL
Leukocytes,Ua: NEGATIVE
Nitrite: NEGATIVE
Protein, ur: NEGATIVE mg/dL
Specific Gravity, Urine: 1.01 (ref 1.005–1.030)
pH: 6 (ref 5.0–8.0)

## 2021-02-27 NOTE — ED Provider Notes (Signed)
[facility] EMERGENCY DEPARTMENT Provider Note   CSN: [id number] Arrival date & time: 02/27/21  1247     History Chief Complaint  Patient presents with   Leg Pain    Katelyn Taylor is a 80 y.o. female.  HPI 80 year old female presents with right leg pain needing an ultrasound to rule out DVT.  Her PCP sent her here.  She had a DVT and PE back in the 70s but is not currently on any type of anticoagulation besides a baby aspirin.  For about a week she has noticed posterior knee/calf/thigh pain.  She does walk a lot and wonders if she strained it but does not remember an injury.  No chest pain or shortness of breath.  No skin changes.  No numbness or weakness.  She does not think her leg is swollen  Past Medical History:  Diagnosis Date   Allergic rhinitis    Anticoagulated    Anxiety    Arthritis    Back pain    Cataracts, bilateral    Chronic gastritis    Clotting disorder (H[facility])    Depression    DVT (deep venous thrombosis) (HCC)    Estrogen deficiency    External hemorrhoid    Fatty liver    GERD (gastroesophageal reflux disease)    Hiatal hernia    Hx of migraines    Hypercholesterolemia    Hypertension    IBS (irritable bowel syndrome)    Neck pain    Osteopenia 08/2014   T score -2.1 FRAX 13%/1.5%   Panic attacks    Severe dysplasia of cervix (CIN III)    Shingles    Stress    Vitamin D deficiency     Patient Active Problem List   Diagnosis Date Noted   Epistaxis s/p cautery/Tx 04/26/2013   Chronic cholecystitis s/p lap chole 03/16/2013 [id number]   Nonalcoholic steatohepatitis ([facility]) 01/10/2013   Nausea and bloating 01/10/2013   Chronic abdominal pain - epigastric & RUQ 01/10/2013   ANXIETY 02/03/2009   DEPRESSION 01/28/2009   DVT 01/28/2009   EXTERNAL HEMORRHOIDS 01/28/2009   GERD 01/28/2009   GASTRITIS, CHRONIC 01/28/2009   HIATAL HERNIA 01/28/2009   HEADACHE, CHRONIC 01/28/2009    Past Surgical History:  Procedure Laterality Date    CATARACT EXTRACTION, BILATERAL     DIAGNOSTIC LAPAROSCOPIC LIVER BIOPSY  03/16/2013   KNEE ARTHROSCOPY Right    LAPAROSCOPIC CHOLECYSTECTOMY  03/16/2013   TMJ ARTHROPLASTY     TUBAL LIGATION     VAGINAL HYSTERECTOMY  1979   Partial for high-grade dysplasia and dysfunctional bleeding historically     OB History     Gravida  3   Para  3   Term  3   Preterm      AB      Living  3      SAB      IAB      Ectopic      Multiple      Live Births              Family History  Problem Relation Age of Onset   Melanoma Mother    Stroke Mother    Heart disease Mother    Cancer Mother        melonoma   Heart disease Father    Breast cancer Sister 4   Cancer Sister        breast   Lung cancer Brother    Cancer Brother  throat, lung   Breast cancer Maternal Aunt        Age unknown   Heart disease Sister    Throat cancer Brother    Ovarian cancer Other        aunt   Colon cancer Neg Hx    Rectal cancer Neg Hx    Stomach cancer Neg Hx     Social History   Tobacco Use   Smoking status: Former    Types: Cigarettes   Smokeless tobacco: Never  Vaping Use   Vaping Use: Never used  Substance Use Topics   Alcohol use: No   Drug use: No    Home Medications Prior to Admission medications   Medication Sig Start Date End Date Taking? Authorizing Provider  aspirin 81 MG chewable tablet Chew 81 mg by mouth daily.   Yes [provider]  cetirizine (ZYRTEC) 10 MG tablet Take 10 mg by mouth daily.   Yes [provider]  Cholecalciferol (VITAMIN D PO) Take 1 tablet by mouth every other day.   Yes [provider]  diazepam (VALIUM) 10 MG tablet Take 1 tablet (10 mg total) by mouth as needed. 02/22/12  Yes Gottsegen, Cherly Anderson, MD  fish oil-omega-3 fatty acids 1000 MG capsule Take 1 g by mouth daily.   Yes [provider]  furosemide (LASIX) 40 MG tablet Take 40 mg by mouth daily.    Yes [provider]  lisinopril  (PRINIVIL,ZESTRIL) 5 MG tablet Take 5 mg by mouth daily.   Yes [provider]  Multiple Vitamins-Minerals (WOMENS MULTIVITAMIN PO) Take by mouth.   Yes [provider]  VITAMIN E PO Take 1 tablet by mouth daily.   Yes [provider]  calcium carbonate (TUMS EX) 750 MG chewable tablet Chew 1 tablet by mouth daily as needed for heartburn.    [provider]  CYANOCOBALAMIN PO Take 1 tablet by mouth daily.    [provider]  sodium chloride (OCEAN) 0.65 % SOLN nasal spray Place 2 sprays into both nostrils as needed for congestion.    [provider]  sucralfate (CARAFATE) 1 g tablet Take 1 g by mouth as needed.    [provider]    Allergies    Amoxicillin, Cephalexin, Codeine, Nsaids, Penicillins, and Sulfonamide derivatives  Review of Systems   Review of Systems  Cardiovascular:  Negative for chest pain and leg swelling.  Musculoskeletal:  Positive for myalgias. Negative for arthralgias.  Neurological:  Negative for weakness and numbness.   Physical Exam Updated Vital Signs BP (!) 151/63 (BP Location: Right Arm)   Pulse 66   Temp 98.6 F (37 C) (Oral)   Resp 18   Ht 5' (1.524 m)   Wt 64.4 kg   SpO2 100%   BMI 27.73 kg/m   Physical Exam Vitals and nursing note reviewed.  Constitutional:      Appearance: She is well-developed.  HENT:     Head: Normocephalic and atraumatic.     Right Ear: External ear normal.     Left Ear: External ear normal.     Nose: Nose normal.  Eyes:     General:        Right eye: No discharge.        Left eye: No discharge.  Cardiovascular:     Rate and Rhythm: Normal rate and regular rhythm.     Pulses:          Dorsalis pedis pulses are 2+ on  the right side.  Pulmonary:     Effort: Pulmonary effort is normal.  Abdominal:     General: There is no distension.  Musculoskeletal:     Right upper leg: No swelling or tenderness.     Right knee: No swelling or bony tenderness. Normal  range of motion. No tenderness.     Right lower leg: No swelling, tenderness or bony tenderness.     Comments: No tenderness is noted on palpation and no swelling noted.  There is a little bit of popliteal discomfort with full flexion of her knee.  Skin:    General: Skin is warm and dry.  Neurological:     Mental Status: She is alert.  Psychiatric:        Mood and Affect: Mood is not anxious.    ED Results / Procedures / Treatments   Labs (all labs ordered are listed, but only abnormal results are displayed) Labs Reviewed  URINALYSIS, ROUTINE W REFLEX MICROSCOPIC - Abnormal; Notable for the following components:      Result Value   Color, Urine STRAW (*)    All other components within normal limits    EKG None  Radiology US Venous Img Lower Unilateral Right  Result Date: 02/27/2021 CLINICAL DATA:  Right lower extremity pain EXAM: RIGHT LOWER EXTREMITY VENOUS DOPPLER ULTRASOUND TECHNIQUE: Gray-scale sonography with graded compression, as well as color Doppler and duplex ultrasound were performed to evaluate the lower extremity deep venous systems from the level of the common femoral vein and including the common femoral, femoral, profunda femoral, popliteal and calf veins including the posterior tibial, peroneal and gastrocnemius veins when visible. The superficial great saphenous vein was also interrogated. Spectral Doppler was utilized to evaluate flow at rest and with distal augmentation maneuvers in the common femoral, femoral and popliteal veins. COMPARISON:  None. FINDINGS: Contralateral Common Femoral Vein: Respiratory phasicity is normal and symmetric with the symptomatic side. No evidence of thrombus. Normal compressibility. Common Femoral Vein: No evidence of thrombus. Normal compressibility, respiratory phasicity and response to augmentation. Saphenofemoral Junction: No evidence of thrombus. Normal compressibility and flow on color Doppler imaging. Profunda Femoral Vein: No  evidence of thrombus. Normal compressibility and flow on color Doppler imaging. Femoral Vein: Duplicated femoral veins in the thigh. One of the paired femoral veins demonstrates chronic eccentric wall thickening without evidence of thrombus. Findings are consistent with sequelae of prior DVT. Popliteal Vein: Similarly, a small amount of eccentric wall thickening is present in the popliteal vein consistent with sequelae of prior DVT. No evidence of active thrombus. Calf Veins: No evidence of thrombus. Normal compressibility and flow on color Doppler imaging. Superficial Great Saphenous Vein: No evidence of thrombus. Normal compressibility. Venous Reflux:  None. Other Findings:  None. IMPRESSION: 1. No evidence of acute deep venous thrombosis. 2. Sequelae of remote prior DVT visualized within 1 of the duplicated femoral veins in the mid thigh, and within the popliteal vein. Electronically Signed   By: Jacqulynn Cadet M.D.   On: 02/27/2021 14:19    Procedures Procedures   Medications Ordered in ED Medications - No data to display  ED Course  I have reviewed the triage vital signs and the nursing notes.  Pertinent labs & imaging results that were available during my care of the patient were reviewed by me and considered in my medical decision making (see chart for details).    MDM Rules/Calculators/A&P  Ultrasound shows no DVT.  Urinalysis was obtained in triage for unknown reason but is negative.  She has no urinary symptoms.  Otherwise, she probably just has a mild calf strain and we discussed supportive care for this.  No neurovascular compromise.  No bony injuries.  Discharged home with return precautions. Final Clinical Impression(s) / ED Diagnoses Final diagnoses:  Strain of right calf muscle    Rx / DC Orders ED Discharge Orders     None        Sherwood Gambler, MD 02/27/21 1432

## 2021-02-27 NOTE — ED Triage Notes (Signed)
Pain from her right knee to her calf for a week. Hx of DVT.

## 2021-03-03 ENCOUNTER — Ambulatory Visit
Admission: RE | Admit: 2021-03-03 | Discharge: 2021-03-03 | Disposition: A | Discharge status: Home / Self Care | Payer: Medicare Other | Source: Ambulatory Visit | Attending: Family Medicine | Admitting: Family Medicine

## 2021-03-03 DIAGNOSIS — R911 Solitary pulmonary nodule: Secondary | ICD-10-CM | POA: Diagnosis not present

## 2021-03-03 DIAGNOSIS — J9811 Atelectasis: Secondary | ICD-10-CM | POA: Diagnosis not present

## 2021-03-03 DIAGNOSIS — I700 Atherosclerosis of aorta: Secondary | ICD-10-CM | POA: Diagnosis not present

## 2021-03-20 DIAGNOSIS — H02831 Dermatochalasis of right upper eyelid: Secondary | ICD-10-CM | POA: Diagnosis not present

## 2021-03-20 DIAGNOSIS — H02834 Dermatochalasis of left upper eyelid: Secondary | ICD-10-CM | POA: Diagnosis not present

## 2021-03-25 DIAGNOSIS — R300 Dysuria: Secondary | ICD-10-CM | POA: Diagnosis not present

## 2021-03-26 DIAGNOSIS — L718 Other rosacea: Secondary | ICD-10-CM | POA: Diagnosis not present

## 2021-03-26 DIAGNOSIS — Z85828 Personal history of other malignant neoplasm of skin: Secondary | ICD-10-CM | POA: Diagnosis not present

## 2021-04-13 DIAGNOSIS — L578 Other skin changes due to chronic exposure to nonionizing radiation: Secondary | ICD-10-CM | POA: Diagnosis not present

## 2021-04-13 DIAGNOSIS — L719 Rosacea, unspecified: Secondary | ICD-10-CM | POA: Diagnosis not present

## 2021-04-13 DIAGNOSIS — Z85828 Personal history of other malignant neoplasm of skin: Secondary | ICD-10-CM | POA: Diagnosis not present

## 2021-04-13 DIAGNOSIS — Z23 Encounter for immunization: Secondary | ICD-10-CM | POA: Diagnosis not present

## 2021-05-18 DIAGNOSIS — L719 Rosacea, unspecified: Secondary | ICD-10-CM | POA: Diagnosis not present

## 2021-05-18 DIAGNOSIS — Z85828 Personal history of other malignant neoplasm of skin: Secondary | ICD-10-CM | POA: Diagnosis not present

## 2021-05-22 DIAGNOSIS — I10 Essential (primary) hypertension: Secondary | ICD-10-CM | POA: Diagnosis not present

## 2021-05-22 DIAGNOSIS — E7439 Other disorders of intestinal carbohydrate absorption: Secondary | ICD-10-CM | POA: Diagnosis not present

## 2021-05-22 DIAGNOSIS — K760 Fatty (change of) liver, not elsewhere classified: Secondary | ICD-10-CM | POA: Diagnosis not present

## 2021-05-22 DIAGNOSIS — I2510 Atherosclerotic heart disease of native coronary artery without angina pectoris: Secondary | ICD-10-CM | POA: Diagnosis not present

## 2021-05-22 DIAGNOSIS — E782 Mixed hyperlipidemia: Secondary | ICD-10-CM | POA: Diagnosis not present

## 2021-05-22 DIAGNOSIS — R911 Solitary pulmonary nodule: Secondary | ICD-10-CM | POA: Diagnosis not present

## 2021-05-22 DIAGNOSIS — R739 Hyperglycemia, unspecified: Secondary | ICD-10-CM | POA: Diagnosis not present

## 2021-05-22 DIAGNOSIS — K219 Gastro-esophageal reflux disease without esophagitis: Secondary | ICD-10-CM | POA: Diagnosis not present

## 2021-05-22 DIAGNOSIS — D126 Benign neoplasm of colon, unspecified: Secondary | ICD-10-CM | POA: Diagnosis not present

## 2021-05-22 DIAGNOSIS — Z0000 Encounter for general adult medical examination without abnormal findings: Secondary | ICD-10-CM | POA: Diagnosis not present

## 2021-05-22 DIAGNOSIS — F418 Other specified anxiety disorders: Secondary | ICD-10-CM | POA: Diagnosis not present

## 2021-05-22 DIAGNOSIS — K589 Irritable bowel syndrome without diarrhea: Secondary | ICD-10-CM | POA: Diagnosis not present

## 2021-06-13 DIAGNOSIS — Z20828 Contact with and (suspected) exposure to other viral communicable diseases: Secondary | ICD-10-CM | POA: Diagnosis not present

## 2021-07-15 DIAGNOSIS — R42 Dizziness and giddiness: Secondary | ICD-10-CM | POA: Diagnosis not present

## 2021-07-15 DIAGNOSIS — I10 Essential (primary) hypertension: Secondary | ICD-10-CM | POA: Diagnosis not present

## 2021-07-24 DIAGNOSIS — R911 Solitary pulmonary nodule: Secondary | ICD-10-CM | POA: Diagnosis not present

## 2021-07-24 DIAGNOSIS — R42 Dizziness and giddiness: Secondary | ICD-10-CM | POA: Diagnosis not present

## 2021-07-28 ENCOUNTER — Other Ambulatory Visit: Payer: Self-pay | Admitting: Family Medicine

## 2021-07-28 DIAGNOSIS — R911 Solitary pulmonary nodule: Secondary | ICD-10-CM

## 2021-07-29 DIAGNOSIS — R42 Dizziness and giddiness: Secondary | ICD-10-CM | POA: Diagnosis not present

## 2021-07-29 DIAGNOSIS — H8111 Benign paroxysmal vertigo, right ear: Secondary | ICD-10-CM | POA: Diagnosis not present

## 2021-08-05 DIAGNOSIS — H8111 Benign paroxysmal vertigo, right ear: Secondary | ICD-10-CM | POA: Diagnosis not present

## 2021-08-05 DIAGNOSIS — R42 Dizziness and giddiness: Secondary | ICD-10-CM | POA: Diagnosis not present

## 2021-08-12 DIAGNOSIS — H8111 Benign paroxysmal vertigo, right ear: Secondary | ICD-10-CM | POA: Diagnosis not present

## 2021-08-12 DIAGNOSIS — R42 Dizziness and giddiness: Secondary | ICD-10-CM | POA: Diagnosis not present

## 2021-08-31 DIAGNOSIS — R42 Dizziness and giddiness: Secondary | ICD-10-CM | POA: Diagnosis not present

## 2021-08-31 DIAGNOSIS — H8111 Benign paroxysmal vertigo, right ear: Secondary | ICD-10-CM | POA: Diagnosis not present

## 2021-09-02 ENCOUNTER — Ambulatory Visit
Admission: RE | Admit: 2021-09-02 | Discharge: 2021-09-02 | Disposition: A | Discharge status: Home / Self Care | Payer: Medicare Other | Source: Ambulatory Visit | Attending: Family Medicine | Admitting: Family Medicine

## 2021-09-02 ENCOUNTER — Telehealth: Payer: Self-pay

## 2021-09-02 ENCOUNTER — Other Ambulatory Visit: Payer: Self-pay

## 2021-09-02 DIAGNOSIS — R911 Solitary pulmonary nodule: Secondary | ICD-10-CM

## 2021-09-02 NOTE — Telephone Encounter (Signed)
Pt called stating she would like for you to give her a call regarding some information she needs for her husband Gershon Mussel.  Pt is requesting that you give her a call at [phone].  Sent Staff Message to Dr. Burr Medico informing her that pt would like for Dr. Burr Medico to give her a call. ?

## 2021-09-03 ENCOUNTER — Telehealth: Payer: Self-pay

## 2021-09-03 NOTE — Telephone Encounter (Signed)
Pt LVM asking for Dr. Burr Medico to please contact her about personal business.  Sent Dr. Burr Medico message asking if she could please contact this pt at [phone]. ?

## 2021-09-08 ENCOUNTER — Other Ambulatory Visit: Payer: Self-pay

## 2021-09-09 DIAGNOSIS — I878 Other specified disorders of veins: Secondary | ICD-10-CM | POA: Diagnosis not present

## 2021-10-10 DIAGNOSIS — Z1231 Encounter for screening mammogram for malignant neoplasm of breast: Secondary | ICD-10-CM | POA: Diagnosis not present

## 2021-10-20 DIAGNOSIS — H538 Other visual disturbances: Secondary | ICD-10-CM | POA: Diagnosis not present

## 2021-10-20 DIAGNOSIS — J301 Allergic rhinitis due to pollen: Secondary | ICD-10-CM | POA: Diagnosis not present

## 2021-10-28 DIAGNOSIS — M545 Low back pain, unspecified: Secondary | ICD-10-CM | POA: Diagnosis not present

## 2021-10-28 DIAGNOSIS — J40 Bronchitis, not specified as acute or chronic: Secondary | ICD-10-CM | POA: Diagnosis not present

## 2021-10-31 DIAGNOSIS — M8589 Other specified disorders of bone density and structure, multiple sites: Secondary | ICD-10-CM | POA: Diagnosis not present

## 2021-12-11 ENCOUNTER — Emergency Department (HOSPITAL_BASED_OUTPATIENT_CLINIC_OR_DEPARTMENT_OTHER)
Admission: EM | Admit: 2021-12-11 | Discharge: 2021-12-11 | Disposition: A | Discharge status: Home / Self Care | Payer: Medicare Other | Attending: Emergency Medicine | Admitting: Emergency Medicine

## 2021-12-11 ENCOUNTER — Encounter (HOSPITAL_BASED_OUTPATIENT_CLINIC_OR_DEPARTMENT_OTHER): Payer: Self-pay | Admitting: Emergency Medicine

## 2021-12-11 ENCOUNTER — Other Ambulatory Visit: Payer: Self-pay

## 2021-12-11 ENCOUNTER — Emergency Department (HOSPITAL_BASED_OUTPATIENT_CLINIC_OR_DEPARTMENT_OTHER): Payer: Medicare Other

## 2021-12-11 DIAGNOSIS — Z7982 Long term (current) use of aspirin: Secondary | ICD-10-CM | POA: Diagnosis not present

## 2021-12-11 DIAGNOSIS — Y93E5 Activity, floor mopping and cleaning: Secondary | ICD-10-CM | POA: Diagnosis not present

## 2021-12-11 DIAGNOSIS — W010XXA Fall on same level from slipping, tripping and stumbling without subsequent striking against object, initial encounter: Secondary | ICD-10-CM | POA: Insufficient documentation

## 2021-12-11 DIAGNOSIS — S060X1A Concussion with loss of consciousness of 30 minutes or less, initial encounter: Secondary | ICD-10-CM | POA: Diagnosis not present

## 2021-12-11 DIAGNOSIS — R519 Headache, unspecified: Secondary | ICD-10-CM | POA: Diagnosis not present

## 2021-12-11 DIAGNOSIS — Z79899 Other long term (current) drug therapy: Secondary | ICD-10-CM | POA: Diagnosis not present

## 2021-12-11 DIAGNOSIS — I10 Essential (primary) hypertension: Secondary | ICD-10-CM | POA: Diagnosis not present

## 2021-12-11 DIAGNOSIS — S0990XA Unspecified injury of head, initial encounter: Secondary | ICD-10-CM | POA: Diagnosis not present

## 2021-12-11 DIAGNOSIS — M542 Cervicalgia: Secondary | ICD-10-CM | POA: Diagnosis not present

## 2021-12-11 NOTE — ED Triage Notes (Signed)
Patient presents to ED via POV from home. Patient reports fall 2 weeks ago. "I haven't been right since". Patient on a daily baby ASA. Reports right sided head pain. Ambulatory with steady gait.

## 2021-12-11 NOTE — ED Provider Notes (Signed)
[facility] EMERGENCY DEPARTMENT Provider Note   CSN: [id number] Arrival date & time: 12/11/21  1434     History  Chief Complaint  Patient presents with   Katelyn Taylor is a 81 y.o. female with medical history significant for GERD, IBS, hypertension, history of migraines, DVT, clotting disorder.  Patient presents to ED for evaluation of fall.  Patient reports that on 6/16, 2 weeks ago, she was at home in her usual state of health when she tripped on a wet floor that had just been mopped and fell onto the right side of her body.  The patient states that since this time she has "just not felt right".  Patient states that she has had ongoing right-sided head pain as well as increased sensation/sensitivity of the right side of her head.  Patient adds that typically she is able to walk around the block with her granddaughter every day however since she fell she has unable to do this due to weakness.  This weakness is not unilateral, it is just all of her body she states.  Patient also reports one transient episode of blurred vision to her right eye that resolved on its own 1 week ago.  Patient denies any recurrence of this.  Patient has history of cataract surgery.  Patient denies blood thinners.  Patient reports that when she fell she believes that she lost consciousness however she is not sure.  Patient endorsing headache, neck pain.  Patient denies any recent nausea, vomiting, unilateral weakness or numbness.   Fall Associated symptoms include headaches.       Home Medications Prior to Admission medications   Medication Sig Start Date End Date Taking? Authorizing Provider  aspirin 81 MG chewable tablet Chew 81 mg by mouth daily.    [provider]  calcium carbonate (TUMS EX) 750 MG chewable tablet Chew 1 tablet by mouth daily as needed for heartburn.    [provider]  cetirizine (ZYRTEC) 10 MG tablet Take 10 mg by mouth daily.    [provider]  Cholecalciferol (VITAMIN D PO) Take 1 tablet by mouth every other day.    [provider]  CYANOCOBALAMIN PO Take 1 tablet by mouth daily.    [provider]  diazepam (VALIUM) 10 MG tablet Take 1 tablet (10 mg total) by mouth as needed. 02/22/12   Bennetta Laos, MD  fish oil-omega-3 fatty acids 1000 MG capsule Take 1 g by mouth daily.    [provider]  furosemide (LASIX) 40 MG tablet Take 40 mg by mouth daily.     [provider]  lisinopril (PRINIVIL,ZESTRIL) 5 MG tablet Take 5 mg by mouth daily.    [provider]  Multiple Vitamins-Minerals (WOMENS MULTIVITAMIN PO) Take by mouth.    [provider]  sodium chloride (OCEAN) 0.65 % SOLN nasal spray Place 2 sprays into both nostrils as needed for congestion.    [provider]  sucralfate (CARAFATE) 1 g tablet Take 1 g by mouth as needed.    [provider]  VITAMIN E PO Take 1 tablet by mouth daily.    [provider]      Allergies    Amoxicillin, Cephalexin, Codeine, Nsaids, Penicillins, and Sulfonamide derivatives    Review of Systems   Review of Systems  Gastrointestinal:  Negative for nausea and vomiting.  Musculoskeletal:  Positive for neck pain.  Neurological:  Positive for syncope, weakness and headaches. Negative  for numbness.  All other systems reviewed and are negative.   Physical Exam Updated Vital Signs BP (!) 152/65   Pulse (!) 58   Temp 97.8 F (36.6 C)   Resp 18   SpO2 99%  Physical Exam Vitals and nursing note reviewed.  Constitutional:      General: She is not in acute distress.    Appearance: Normal appearance. She is normal weight. She is not ill-appearing, toxic-appearing or diaphoretic.  HENT:     Head: Normocephalic and atraumatic.     Nose: Nose normal. No congestion.     Mouth/Throat:     Mouth: Mucous membranes are moist.     Pharynx: Oropharynx is clear.  Eyes:     Extraocular Movements: Extraocular  movements intact.     Conjunctiva/sclera: Conjunctivae normal.     Pupils: Pupils are equal, round, and reactive to light.  Cardiovascular:     Rate and Rhythm: Normal rate and regular rhythm.  Pulmonary:     Effort: Pulmonary effort is normal.     Breath sounds: Normal breath sounds. No wheezing.  Abdominal:     General: Abdomen is flat. Bowel sounds are normal.     Palpations: Abdomen is soft.     Tenderness: There is no abdominal tenderness.  Musculoskeletal:     Cervical back: Normal, normal range of motion and neck supple. No tenderness.     Thoracic back: Normal.     Lumbar back: Normal.  Skin:    General: Skin is warm and dry.     Capillary Refill: Capillary refill takes less than 2 seconds.  Neurological:     General: No focal deficit present.     Mental Status: She is alert.     GCS: GCS eye subscore is 4. GCS verbal subscore is 5. GCS motor subscore is 6.     Cranial Nerves: Cranial nerves 2-12 are intact. No cranial nerve deficit.     Sensory: Sensation is intact. No sensory deficit.     Motor: Motor function is intact. No weakness.     Coordination: Coordination is intact. Heel to [facility] Test normal.     ED Results / Procedures / Treatments   Labs (all labs ordered are listed, but only abnormal results are displayed) Labs Reviewed - No data to display  EKG None  Radiology CT Cervical Spine Wo Contrast  Result Date: 12/11/2021 CLINICAL DATA:  Fall 2 weeks ago.  Pain. EXAM: CT CERVICAL SPINE WITHOUT CONTRAST TECHNIQUE: Multidetector CT imaging of the cervical spine was performed without intravenous contrast. Multiplanar CT image reconstructions were also generated. RADIATION DOSE REDUCTION: This exam was performed according to the departmental dose-optimization program which includes automated exposure control, adjustment of the mA and/or kV according to patient size and/or use of iterative reconstruction technique. COMPARISON:  None Available. FINDINGS: Alignment:  Slight degenerative anterolisthesis is present at C4-5. No other significant listhesis is present. Straightening of the normal cervical lordosis is noted. Skull base and vertebrae: Craniocervical junction is within normal limits. Vertebral body heights are maintained. No acute fractures are present. Soft tissues and spinal canal: No prevertebral fluid or swelling. No visible canal hematoma. Disc levels: Uncovertebral disease leads to right foraminal narrowing at C3-4 and right greater than left foraminal narrowing at C4-5. Moderate foraminal narrowing bilaterally at C5-6 and C6-7 is worse on the left. Upper chest: The lung apices are clear. Thoracic inlet is within normal limits. IMPRESSION: 1. No acute fracture or traumatic subluxation. 2. Multilevel degenerative disc  disease as described. Electronically Signed   By: San Morelle M.D.   On: 12/11/2021 18:40   CT Head Wo Contrast  Result Date: 12/11/2021 CLINICAL DATA:  Head trauma. Fall 2 weeks ago. Patient reports right-sided head pain since that time. EXAM: CT HEAD WITHOUT CONTRAST TECHNIQUE: Contiguous axial images were obtained from the base of the skull through the vertex without intravenous contrast. RADIATION DOSE REDUCTION: This exam was performed according to the departmental dose-optimization program which includes automated exposure control, adjustment of the mA and/or kV according to patient size and/or use of iterative reconstruction technique. COMPARISON:  MR head without contrast 09/07/2016 FINDINGS: Brain: No acute infarct, hemorrhage, or mass lesion is present. Mild white matter changes are stable to slightly progressed. Basal ganglia are intact. Insular ribbon is normal bilaterally. No focal cortical abnormalities are present. The ventricles are of normal size. No significant extraaxial fluid collection is present. Vascular: Atherosclerotic calcifications are present within the cavernous internal carotid arteries. No hyperdense vessel  is present. Skull: Insert normal skull No significant extracranial soft tissue lesion is present. Sinuses/Orbits: The paranasal sinuses and mastoid air cells are clear. Bilateral lens replacements are noted. Globes and orbits are otherwise unremarkable. IMPRESSION: 1. No acute intracranial abnormality or significant interval change. 2. Stable to slightly progressed white matter disease. This likely reflects the sequela of chronic microvascular ischemia. Electronically Signed   By: San Morelle M.D.   On: 12/11/2021 18:35    Procedures Procedures   Medications Ordered in ED Medications - No data to display  ED Course/ Medical Decision Making/ A&P                           Medical Decision Making Amount and/or Complexity of Data Reviewed Radiology: ordered.   81 year old female presents to ED for evaluation.  Please see HPI for further details  On examination, the patient is alert and oriented x3.  The patient's neurological examination shows no focal neurodeficits.  The patient has no laceration or overlying skin change to the right side of her scalp where she is experiencing increased sensation.  The patient has no pain in the trigeminal distribution of her face.  There is no overlying skin change about her face.  The patient's EOMs are intact, nonpainful with movement.  The patient follows commands appropriately.  The patient is nontoxic in appearance.  Patient worked up utilizing the following imaging studies interpreted me personally: - CT cervical spine shows no fracture, subluxation or deformity - CT head shows no midline shift, herniation, acute abnormality  At this time, most likely cause of patient's symptoms due to concussion.  Patient will be advised to follow back up with PCP for further management.  Patient given return precautions and she voiced understanding.  The patient had all of her questions answered to her satisfaction.  The patient stable this time for discharge  home.  Final Clinical Impression(s) / ED Diagnoses Final diagnoses:  Concussion with loss of consciousness of 30 minutes or less, initial encounter    Rx / DC Orders ED Discharge Orders     None         Lawana Chambers 12/11/21 Evlyn Courier, MD 12/11/21 1925

## 2021-12-11 NOTE — ED Notes (Signed)
Patient transported to CT 

## 2021-12-11 NOTE — ED Notes (Signed)
Patient verbalizes understanding of discharge instructions. Opportunity for questioning and answers were provided. Armband removed by staff, pt discharged from ED. Ambulated out to lobby  

## 2021-12-11 NOTE — Discharge Instructions (Signed)
Return to ED with any new symptoms such as unilateral weakness or numbness Please read attached informational guide concerning concussions Please follow-up with Dr. Sheryn Bison in the next 1 week

## 2021-12-11 NOTE — ED Notes (Signed)
Pt ambulatory to room.

## 2021-12-18 DIAGNOSIS — R42 Dizziness and giddiness: Secondary | ICD-10-CM | POA: Diagnosis not present

## 2021-12-18 DIAGNOSIS — S0990XD Unspecified injury of head, subsequent encounter: Secondary | ICD-10-CM | POA: Diagnosis not present

## 2022-01-12 DIAGNOSIS — H8111 Benign paroxysmal vertigo, right ear: Secondary | ICD-10-CM | POA: Diagnosis not present

## 2022-01-12 DIAGNOSIS — R42 Dizziness and giddiness: Secondary | ICD-10-CM | POA: Diagnosis not present

## 2022-01-25 ENCOUNTER — Ambulatory Visit: Payer: Medicare Other | Admitting: Physician Assistant

## 2022-01-26 NOTE — Progress Notes (Unsigned)
Office Visit    Patient Name: Katelyn Taylor Date of Encounter: 01/28/2022  PCP:  Aura Dials, MD   [facility]  Cardiologist:  Freada Bergeron, MD  Advanced Practice Provider:  No care team member to display Electrophysiologist:  None   HPI    Katelyn Taylor is a 81 y.o. female with past medical history significant for hypertension, hyperlipidemia, and family history of CAD presents today for annual follow-up visit.  Her history includes seeing Dr. Meda Coffee 2021 for chest pain.  Coronary CTA 01/01/2020 showed calcium score of 8, minimal nonobstructive CAD with 0 to 24% proximal RCA and distal left main.  Chest pain felt to be noncardiac.  Also complained of orthostatic hypotension symptoms and TTE was ordered to show normal LV function 66 5% with moderate concentric LVH and grade 1 DD.  She was last seen by Dr. Johney Frame 10/2020 and was doing better at that time.  It has been over 2 years since her husband passed away and she is overall improving.  She has been working on lifestyle modifications and has lost significant mount of weight since her last office visit (23 pounds).  She did not have any CV symptoms at that time.  Blood pressure is well controlled.  She was taking all prescribed medications.   Today, she states that she fell in June and has been having headaches.  She also states that she did not lose consciousness or vomit.  She ended up going to bed and was seen in the ER that next day.  CT of the spine showed no fracture and CT of the head showed no midline shift, herniation, or acute abnormality.  She continues to walk a half a mile to a mile a day.  She does get a flushed feeling with occasional rapid heartbeat but this does not happen often.  She also has a diagnosis of anxiety.  She describes to me a squeezing pain that starts in her neck and radiates to her chest.  She knows to go inside immediately and take a baby aspirin and it usually goes away.   Usually last for a few minutes.  She has had squeezing before and center of her chest but this sensation with starting in her neck is new.  She states she has had 2 episodes since June when she fell.  Reports no shortness of breath nor dyspnea on exertion.  No edema, orthopnea, PND.   Past Medical History    Past Medical History:  Diagnosis Date   Allergic rhinitis    Anticoagulated    Anxiety    Arthritis    Back pain    Cataracts, bilateral    Chronic gastritis    Clotting disorder (HCC)    Depression    DVT (deep venous thrombosis) (HCC)    Estrogen deficiency    External hemorrhoid    Fatty liver    GERD (gastroesophageal reflux disease)    Hiatal hernia    Hx of migraines    Hypercholesterolemia    Hypertension    IBS (irritable bowel syndrome)    Neck pain    Osteopenia 08/2014   T score -2.1 FRAX 13%/1.5%   Panic attacks    Severe dysplasia of cervix (CIN III)    Shingles    Stress    Vitamin D deficiency    Past Surgical History:  Procedure Laterality Date   CATARACT EXTRACTION, BILATERAL     DIAGNOSTIC LAPAROSCOPIC LIVER BIOPSY  03/16/2013   KNEE ARTHROSCOPY Right    LAPAROSCOPIC CHOLECYSTECTOMY  03/16/2013   TMJ ARTHROPLASTY     TUBAL LIGATION     VAGINAL HYSTERECTOMY  1979   Partial for high-grade dysplasia and dysfunctional bleeding historically    Allergies  Allergies  Allergen Reactions   Amoxicillin Other (See Comments)    Yeast infection   Cephalexin Other (See Comments)    headache   Codeine Nausea And Vomiting   Nsaids Other (See Comments)    Headache, can take aspirin   Penicillins Other (See Comments)    Yeast infections   Sulfonamide Derivatives Other (See Comments)    headache      EKGs/Labs/Other Studies Reviewed:   The following studies were reviewed today:  Coronary CTA 12/31/2019 IMPRESSION: 1. Coronary calcium score of 8. This was 85 percentile for age and sex matched control.   2. Normal coronary origin with right  dominance.   3. CAD-RADS 1. Minimal non-obstructive CAD (0-24%) in the proximal RCA and distal left main. Consider non-atherosclerotic causes of chest pain. Consider preventive therapy and risk factor modification.     Electronically Signed   By: Ena Dawley   On: 01/01/2020 14:49   2D echo 12/28/2019  IMPRESSIONS   1. Left ventricular ejection fraction, by estimation, is 60 to 65%. The  left ventricle has normal function. The left ventricle has no regional  wall motion abnormalities. There is moderate concentric left ventricular  hypertrophy. Left ventricular  diastolic parameters are consistent with Grade I diastolic dysfunction  (impaired relaxation).   2. Right ventricular systolic function is normal. The right ventricular  size is normal. Tricuspid regurgitation signal is inadequate for assessing  PA pressure.   3. The mitral valve is normal in structure. Trivial mitral valve  regurgitation. No evidence of mitral stenosis.   4. The aortic valve is tricuspid. Aortic valve regurgitation is mild. No  aortic stenosis is present.   5. The inferior vena cava is dilated in size with <50% respiratory  variability, suggesting right atrial pressure of 15 mmHg.   FINDINGS   Left Ventricle: Left ventricular ejection fraction, by estimation, is 60  to 65%. The left ventricle has normal function. The left ventricle has no  regional wall motion abnormalities. The left ventricular internal cavity  size was normal in size. There is   moderate concentric left ventricular hypertrophy. Left ventricular  diastolic parameters are consistent with Grade I diastolic dysfunction  (impaired relaxation). Normal left ventricular filling pressure.   Right Ventricle: The right ventricular size is normal. No increase in  right ventricular wall thickness. Right ventricular systolic function is  normal. Tricuspid regurgitation signal is inadequate for assessing PA  pressure.   Left Atrium: Left  atrial size was normal in size.   Right Atrium: Right atrial size was normal in size.   Pericardium: There is no evidence of pericardial effusion.   Mitral Valve: The mitral valve is normal in structure. Normal mobility of  the mitral valve leaflets. Trivial mitral valve regurgitation. No evidence  of mitral valve stenosis.   Tricuspid Valve: The tricuspid valve is normal in structure. Tricuspid  valve regurgitation is not demonstrated. No evidence of tricuspid  stenosis.   Aortic Valve: The aortic valve is tricuspid. Aortic valve regurgitation is  mild. No aortic stenosis is present.   Pulmonic Valve: The pulmonic valve was normal in structure. Pulmonic valve  regurgitation is trivial. No evidence of pulmonic stenosis.   Aorta: The aortic root is normal  in size and structure.   Venous: The inferior vena cava is dilated in size with less than 50%  respiratory variability, suggesting right atrial pressure of 15 mmHg.   IAS/Shunts: No atrial level shunt detected by color flow Doppler.   EKG:  EKG is  ordered today.  The ekg ordered today demonstrates normal sinus rhythm, rate 67 bpm  Recent Labs: No results found for requested labs within last 365 days.  Recent Lipid Panel    Component Value Date/Time   CHOL 135 02/22/2012 1051   TRIG 78 02/22/2012 1051   HDL 57 02/22/2012 1051   CHOLHDL 2.4 02/22/2012 1051   VLDL 16 02/22/2012 1051   LDLCALC 62 02/22/2012 1051   Home Medications   Current Meds  Medication Sig   aspirin 81 MG chewable tablet Chew 81 mg by mouth daily.   calcium carbonate (TUMS EX) 750 MG chewable tablet Chew 1 tablet by mouth daily as needed for heartburn.   cetirizine (ZYRTEC) 10 MG tablet Take 10 mg by mouth daily.   Cholecalciferol (VITAMIN D PO) Take 1 tablet by mouth every other day.   CYANOCOBALAMIN PO Take 1 tablet by mouth daily.   diazepam (VALIUM) 10 MG tablet Take 1 tablet (10 mg total) by mouth as needed.   fish oil-omega-3 fatty acids  1000 MG capsule Take 1 g by mouth daily.   furosemide (LASIX) 40 MG tablet Take 40 mg by mouth daily.    lisinopril (PRINIVIL,ZESTRIL) 5 MG tablet Take 5 mg by mouth daily.   Multiple Vitamins-Minerals (WOMENS MULTIVITAMIN PO) Take by mouth.   sodium chloride (OCEAN) 0.65 % SOLN nasal spray Place 2 sprays into both nostrils as needed for congestion.   sucralfate (CARAFATE) 1 g tablet Take 1 g by mouth as needed.   VITAMIN E PO Take 1 tablet by mouth daily.     Review of Systems      All other systems reviewed and are otherwise negative except as noted above.  Physical Exam    VS:  BP 122/72   Pulse 67   Ht 5' (1.524 m)   Wt 147 lb 3.2 oz (66.8 kg)   SpO2 96%   BMI 28.75 kg/m  , BMI Body mass index is 28.75 kg/m.  Wt Readings from Last 3 Encounters:  01/28/22 147 lb 3.2 oz (66.8 kg)  02/27/21 142 lb (64.4 kg)  10/21/20 153 lb 3.2 oz (69.5 kg)     GEN: Well nourished, well developed, in no acute distress. HEENT: normal. Neck: Supple, no JVD, carotid bruits, or masses. Cardiac: RRR, no murmurs, rubs, or gallops. No clubbing, cyanosis, edema.  Radials/PT 2+ and equal bilaterally.  Respiratory:  Respirations regular and unlabored, clear to auscultation bilaterally. GI: Soft, nontender, nondistended. MS: No deformity or atrophy. Skin: Warm and dry, no rash. Neuro:  Strength and sensation are intact. Psych: Normal affect.  Assessment & Plan    Nonobstructive CAD -She had a coronary CT scan done 01/01/2020 which showed calcium score of 8 (26 percentile for age and sex matched controls), minimal nonobstructive CAD (0 to 24%) in the proximal RCA and distal left main -Due to her symptoms we have ordered a repeat coronary CTA  Hypertension -Well-controlled today, 122/72 -Continue current medication regimen which includes Lasix 40 mg daily, lisinopril 5 mg daily, aspirin 81 mg daily  Hyperlipidemia -Most recent lipid panel 12/2020 showed total cholesterol 177, HDL 75, LDL 87,  triglycerides 77 -We will order updated lipid panel today -She is not  currently on any medications for her cholesterol  History of orthostatic hypotension -not occurring anymore  Family history of early CAD -continue aggressive risk reduction with cholesterol surveillance -ordered coronary CTA today since symptomatic  Nodule of lower lobe of left lung -continue routine surveillance  Anxiety -valium as needed -rapid heart beat but does happen often -son is going through [employer] gravis which causes her more anxiety -wore a monitor in the past which was unrevealing         Disposition: Follow up 6 weeks with Freada Bergeron, MD or APP.  Signed, Elgie Collard, PA-C 01/28/2022, 12:14 PM [facility]

## 2022-01-28 ENCOUNTER — Encounter: Payer: Self-pay | Admitting: Physician Assistant

## 2022-01-28 ENCOUNTER — Ambulatory Visit (INDEPENDENT_AMBULATORY_CARE_PROVIDER_SITE_OTHER): Payer: Medicare Other | Admitting: Physician Assistant

## 2022-01-28 VITALS — BP 122/72 | HR 67 | Ht 60.0 in | Wt 147.2 lb

## 2022-01-28 DIAGNOSIS — R911 Solitary pulmonary nodule: Secondary | ICD-10-CM

## 2022-01-28 DIAGNOSIS — I2510 Atherosclerotic heart disease of native coronary artery without angina pectoris: Secondary | ICD-10-CM

## 2022-01-28 DIAGNOSIS — Z8679 Personal history of other diseases of the circulatory system: Secondary | ICD-10-CM

## 2022-01-28 DIAGNOSIS — E785 Hyperlipidemia, unspecified: Secondary | ICD-10-CM | POA: Diagnosis not present

## 2022-01-28 DIAGNOSIS — Z8249 Family history of ischemic heart disease and other diseases of the circulatory system: Secondary | ICD-10-CM | POA: Diagnosis not present

## 2022-01-28 DIAGNOSIS — I10 Essential (primary) hypertension: Secondary | ICD-10-CM

## 2022-01-28 DIAGNOSIS — R079 Chest pain, unspecified: Secondary | ICD-10-CM | POA: Diagnosis not present

## 2022-01-28 MED ORDER — METOPROLOL TARTRATE 50 MG PO TABS: ORAL_TABLET | ORAL | 0 refills | Status: DC

## 2022-01-28 MED ORDER — NITROGLYCERIN 0.4 MG SL SUBL: 0.4000 mg | SUBLINGUAL_TABLET | SUBLINGUAL | 5 refills | Status: AC | PRN

## 2022-01-28 NOTE — Patient Instructions (Addendum)
Medication Instructions:  1.Start nitroglycerin 0.4 sublingual, place one tablet under the tongue as needed for chest pain, max 3 doses, if no relief call 911 *If you need a refill on your cardiac medications before your next appointment, please call your pharmacy*   Lab Work: Lipid, BMET, CBC and lft's today If you have labs (blood work) drawn today and your tests are completely normal, you will receive your results only by: [place] (if you have MyChart) OR A paper copy in the mail If you have any lab test that is abnormal or we need to change your treatment, we will call you to review the results.   Testing/Procedures:    Your cardiac CT will be scheduled at one of the below locations:   [facility] [address] [place], [place] [address] [phone]  [place] [address] [place],  [address] [phone]  If scheduled at [facility], please arrive at the [facility] and Children's Entrance (Entrance C2) of [facility] 30 minutes prior to test start time. You can use the FREE valet parking offered at entrance C (encouraged to control the heart rate for the test)  Proceed to the [facility] Radiology Department (first floor) to check-in and test prep.  All radiology patients and guests should use entrance C2 at [facility], accessed from [address], even though the hospital's physical address listed is [address].    If scheduled at [facility], please arrive 15 mins early for check-in and test prep.  Please follow these instructions carefully (unless otherwise directed):  On the Night Before the Test: Be sure to Drink plenty of water. Do not consume any caffeinated/decaffeinated beverages or chocolate 12 hours prior to your test. Do not take any antihistamines 12 hours prior to your test.  On the Day of the  Test: Drink plenty of water until 1 hour prior to the test. Do not eat any food 4 hours prior to the test. You may take your regular medications prior to the test.  Take metoprolol (Lopressor) two hours prior to test. HOLD Furosemide/Hydrochlorothiazide morning of the test. FEMALES- please wear underwire-free bra if available, avoid dresses & tight clothing       After the Test: Drink plenty of water. After receiving IV contrast, you may experience a mild flushed feeling. This is normal. On occasion, you may experience a mild rash up to 24 hours after the test. This is not dangerous. If this occurs, you can take Benadryl 25 mg and increase your fluid intake. If you experience trouble breathing, this can be serious. If it is severe call 911 IMMEDIATELY. If it is mild, please call our office. If you take any of these medications: Glipizide/Metformin, Avandament, Glucavance, please do not take 48 hours after completing test unless otherwise instructed.  We will call to schedule your test 2-4 weeks out understanding that some insurance companies will need an authorization prior to the service being performed.   For non-scheduling related questions, please contact the cardiac imaging nurse navigator should you have any questions/concerns: Marchia Bond, Cardiac Imaging Nurse Navigator Gordy Clement, Cardiac Imaging Nurse Navigator [facility] Direct Office Dial: [phone]   For scheduling needs, including cancellations and rescheduling, please call [place], [phone].    Follow-Up: At [facility], you and your health needs are our priority.  As part of our continuing mission to provide you  with exceptional heart care, we have created designated Provider Care Teams.  These Care Teams include your primary Cardiologist (physician) and Advanced Practice Providers (APPs -  Physician Assistants and Nurse Practitioners) who all work together to provide you with  the care you need, when you need it.   Your next appointment:   6 week(s)  The format for your next appointment:   In Person  Provider:   Freada Bergeron, MD  or Nicholes Rough, PA-C     {  Important Information About [place]

## 2022-01-29 ENCOUNTER — Telehealth: Payer: Self-pay | Admitting: Cardiology

## 2022-01-29 LAB — HEPATIC FUNCTION PANEL
ALT: 17 IU/L (ref 0–32)
AST: 25 IU/L (ref 0–40)
Albumin: 4.9 g/dL — ABNORMAL HIGH (ref 3.8–4.8)
Alkaline Phosphatase: 53 IU/L (ref 44–121)
Bilirubin Total: 0.4 mg/dL (ref 0.0–1.2)
Bilirubin, Direct: 0.13 mg/dL (ref 0.00–0.40)
Total Protein: 7.2 g/dL (ref 6.0–8.5)

## 2022-01-29 LAB — LIPID PANEL
Chol/HDL Ratio: 2.6 ratio (ref 0.0–4.4)
Cholesterol, Total: 183 mg/dL (ref 100–199)
HDL: 70 mg/dL (ref >39)
LDL Chol Calc (NIH): 82 mg/dL (ref 0–99)
Triglycerides: 186 mg/dL — ABNORMAL HIGH (ref 0–149)
VLDL Cholesterol Cal: 31 mg/dL (ref 5–40)

## 2022-01-29 LAB — BASIC METABOLIC PANEL
BUN/Creatinine Ratio: 18 (ref 12–28)
BUN: 14 mg/dL (ref 8–27)
CO2: 26 mmol/L (ref 20–29)
Calcium: 9.9 mg/dL (ref 8.7–10.3)
Chloride: 98 mmol/L (ref 96–106)
Creatinine, Ser: 0.77 mg/dL (ref 0.57–1.00)
Glucose: 90 mg/dL (ref 70–99)
Potassium: 4.7 mmol/L (ref 3.5–5.2)
Sodium: 138 mmol/L (ref 134–144)
eGFR: 78 mL/min/{1.73_m2} (ref >59)

## 2022-01-29 LAB — CBC
Hematocrit: 41 % (ref 34.0–46.6)
Hemoglobin: 14.1 g/dL (ref 11.1–15.9)
MCH: 31.7 pg (ref 26.6–33.0)
MCHC: 34.4 g/dL (ref 31.5–35.7)
MCV: 92 fL (ref 79–97)
Platelets: 317 10*3/uL (ref 150–450)
RBC: 4.45 x10E6/uL (ref 3.77–5.28)
RDW: 12.6 % (ref 11.7–15.4)
WBC: 8.2 10*3/uL (ref 3.4–10.8)

## 2022-01-29 NOTE — Telephone Encounter (Signed)
Patient is returning call to discuss lab results.

## 2022-01-29 NOTE — Telephone Encounter (Signed)
Spoke with patient, see result note

## 2022-02-17 ENCOUNTER — Other Ambulatory Visit (HOSPITAL_COMMUNITY): Payer: Medicare Other

## 2022-02-17 ENCOUNTER — Telehealth (HOSPITAL_COMMUNITY): Payer: Self-pay | Admitting: *Deleted

## 2022-02-17 NOTE — Telephone Encounter (Signed)
Reaching out to patient to offer assistance regarding upcoming cardiac imaging study; pt verbalizes understanding of appt date/time, parking situation and where to check in, pre-test NPO status and medications ordered, and verified current allergies; name and call back number provided for further questions should they arise  Gordy Clement RN Navigator [facility] [phone] office [phone] cell  Patient to take 25m metoprolol tartrate two hours prior to her cardiac CT scan.  She is aware to arrive 12pm.

## 2022-02-18 ENCOUNTER — Ambulatory Visit (HOSPITAL_COMMUNITY)
Admission: RE | Admit: 2022-02-18 | Discharge: 2022-02-18 | Disposition: A | Discharge status: Home / Self Care | Payer: Medicare Other | Source: Ambulatory Visit | Attending: Physician Assistant | Admitting: Physician Assistant

## 2022-02-18 DIAGNOSIS — R079 Chest pain, unspecified: Secondary | ICD-10-CM | POA: Diagnosis not present

## 2022-02-18 DIAGNOSIS — I2510 Atherosclerotic heart disease of native coronary artery without angina pectoris: Secondary | ICD-10-CM | POA: Diagnosis not present

## 2022-02-18 DIAGNOSIS — Z8249 Family history of ischemic heart disease and other diseases of the circulatory system: Secondary | ICD-10-CM | POA: Insufficient documentation

## 2022-02-18 MED ORDER — IOHEXOL 350 MG/ML SOLN
100.0000 mL | Freq: Once | INTRAVENOUS | Status: AC | PRN
  Administered 2022-02-18: 100 mL via INTRAVENOUS

## 2022-02-18 MED ORDER — NITROGLYCERIN 0.4 MG SL SUBL: 0.8000 mg | SUBLINGUAL_TABLET | Freq: Once | SUBLINGUAL | Status: AC

## 2022-02-18 MED ORDER — NITROGLYCERIN 0.4 MG SL SUBL
SUBLINGUAL_TABLET | SUBLINGUAL | Status: AC
  Administered 2022-02-18: 0.8 mg via SUBLINGUAL
  Filled 2022-02-18: qty 2

## 2022-03-10 NOTE — Progress Notes (Signed)
Office Visit    Patient Name: Katelyn Taylor Date of Encounter: 03/12/2022  PCP:  Aura Dials, MD   [facility]  Cardiologist:  Freada Bergeron, MD  Advanced Practice Provider:  No care team member to display Electrophysiologist:  None   HPI    Katelyn Taylor is a 81 y.o. female with past medical history significant for hypertension, hyperlipidemia, and family history of CAD presents today for annual follow-up visit.  Her history includes seeing Dr. Meda Coffee 2021 for chest pain.  Coronary CTA 01/01/2020 showed calcium score of 8, minimal nonobstructive CAD with 0 to 24% proximal RCA and distal left main.  Chest pain felt to be noncardiac.  Also complained of orthostatic hypotension symptoms and TTE was ordered to show normal LV function 66 5% with moderate concentric LVH and grade 1 DD.  She was last seen by Dr. Johney Frame 10/2020 and was doing better at that time.  It has been over 2 years since her husband passed away and she is overall improving.  She has been working on lifestyle modifications and has lost significant mount of weight since her last office visit (23 pounds).  She did not have any CV symptoms at that time.  Blood pressure is well controlled.  She was taking all prescribed medications.   She was last seen by me on 01/28/2022 . She fell in June and has been having headaches.  She also states that she did not lose consciousness or vomit.  She ended up going to bed and was seen in the ER that next day.  CT of the spine showed no fracture and CT of the head showed no midline shift, herniation, or acute abnormality.  She continues to walk a half a mile to a mile a day.  She does get a flushed feeling with occasional rapid heartbeat but this does not happen often.  She also has a diagnosis of anxiety.  She describes to me a squeezing pain that starts in her neck and radiates to her chest.  She knows to go inside immediately and take a baby aspirin and it  usually goes away.  Usually last for a few minutes.  She has had squeezing before and center of her chest but this sensation with starting in her neck is new.  She states she has had 2 episodes since June when she fell.  We obtained a coronary CT scan which showed mild stenosis in the distal left main and ostial LAD, low risk for future cardiac events.  Today, she shares with me today that her eyes have been an issue.  She had a procedure the other day and had difficulty with her vision.  She can see okay now.  She also states that she thinks she has a hiatal hernia which is causing some pain from time to time.  She usually takes baby aspirin since, this happens.  Overall, her diet is pretty good.  She does like beanie weanies which are high in sodium.  I cautioned her to limit these.  She did bring up it would be watching out for the lung nodule that was found inadvertently.  We discussed that Dr. Sheryn Bison would need to monitor this closely.  We reviewed her coronary CTA and all questions were answered.  Reports no shortness of breath nor dyspnea on exertion. Reports no chest pain, pressure, or tightness. No edema, orthopnea, PND. Reports no palpitations.    Past Medical History  Past Medical History:  Diagnosis Date   Allergic rhinitis    Anticoagulated    Anxiety    Arthritis    Back pain    Cataracts, bilateral    Chronic gastritis    Clotting disorder (HCC)    Depression    DVT (deep venous thrombosis) (HCC)    Estrogen deficiency    External hemorrhoid    Fatty liver    GERD (gastroesophageal reflux disease)    Hiatal hernia    Hx of migraines    Hypercholesterolemia    Hypertension    IBS (irritable bowel syndrome)    Neck pain    Osteopenia 08/2014   T score -2.1 FRAX 13%/1.5%   Panic attacks    Severe dysplasia of cervix (CIN III)    Shingles    Stress    Vitamin D deficiency    Past Surgical History:  Procedure Laterality Date   CATARACT EXTRACTION, BILATERAL      DIAGNOSTIC LAPAROSCOPIC LIVER BIOPSY  03/16/2013   KNEE ARTHROSCOPY Right    LAPAROSCOPIC CHOLECYSTECTOMY  03/16/2013   TMJ ARTHROPLASTY     TUBAL LIGATION     VAGINAL HYSTERECTOMY  1979   Partial for high-grade dysplasia and dysfunctional bleeding historically    Allergies  Allergies  Allergen Reactions   Amoxicillin Other (See Comments)    Yeast infection   Cephalexin Other (See Comments)    headache   Codeine Nausea And Vomiting   Nsaids Other (See Comments)    Headache, can take aspirin   Penicillins Other (See Comments)    Yeast infections   Sulfonamide Derivatives Other (See Comments)    headache      EKGs/Labs/Other Studies Reviewed:   The following studies were reviewed today:  Coronary CTA 12/31/2019 IMPRESSION: 1. Coronary calcium score of 8. This was 47 percentile for age and sex matched control.   2. Normal coronary origin with right dominance.   3. CAD-RADS 1. Minimal non-obstructive CAD (0-24%) in the proximal RCA and distal left main. Consider non-atherosclerotic causes of chest pain. Consider preventive therapy and risk factor modification.     Electronically Signed   By: Ena Dawley   On: 01/01/2020 14:49   2D echo 12/28/2019  IMPRESSIONS   1. Left ventricular ejection fraction, by estimation, is 60 to 65%. The  left ventricle has normal function. The left ventricle has no regional  wall motion abnormalities. There is moderate concentric left ventricular  hypertrophy. Left ventricular  diastolic parameters are consistent with Grade I diastolic dysfunction  (impaired relaxation).   2. Right ventricular systolic function is normal. The right ventricular  size is normal. Tricuspid regurgitation signal is inadequate for assessing  PA pressure.   3. The mitral valve is normal in structure. Trivial mitral valve  regurgitation. No evidence of mitral stenosis.   4. The aortic valve is tricuspid. Aortic valve regurgitation is mild. No  aortic  stenosis is present.   5. The inferior vena cava is dilated in size with <50% respiratory  variability, suggesting right atrial pressure of 15 mmHg.   FINDINGS   Left Ventricle: Left ventricular ejection fraction, by estimation, is 60  to 65%. The left ventricle has normal function. The left ventricle has no  regional wall motion abnormalities. The left ventricular internal cavity  size was normal in size. There is   moderate concentric left ventricular hypertrophy. Left ventricular  diastolic parameters are consistent with Grade I diastolic dysfunction  (impaired relaxation). Normal left ventricular filling pressure.  Right Ventricle: The right ventricular size is normal. No increase in  right ventricular wall thickness. Right ventricular systolic function is  normal. Tricuspid regurgitation signal is inadequate for assessing PA  pressure.   Left Atrium: Left atrial size was normal in size.   Right Atrium: Right atrial size was normal in size.   Pericardium: There is no evidence of pericardial effusion.   Mitral Valve: The mitral valve is normal in structure. Normal mobility of  the mitral valve leaflets. Trivial mitral valve regurgitation. No evidence  of mitral valve stenosis.   Tricuspid Valve: The tricuspid valve is normal in structure. Tricuspid  valve regurgitation is not demonstrated. No evidence of tricuspid  stenosis.   Aortic Valve: The aortic valve is tricuspid. Aortic valve regurgitation is  mild. No aortic stenosis is present.   Pulmonic Valve: The pulmonic valve was normal in structure. Pulmonic valve  regurgitation is trivial. No evidence of pulmonic stenosis.   Aorta: The aortic root is normal in size and structure.   Venous: The inferior vena cava is dilated in size with less than 50%  respiratory variability, suggesting right atrial pressure of 15 mmHg.   IAS/Shunts: No atrial level shunt detected by color flow Doppler.   EKG:  EKG is  ordered today.   The ekg ordered today demonstrates normal sinus rhythm, rate 67 bpm  Recent Labs: 01/28/2022: ALT 17; BUN 14; Creatinine, Ser 0.77; Hemoglobin 14.1; Platelets 317; Potassium 4.7; Sodium 138  Recent Lipid Panel    Component Value Date/Time   CHOL 183 01/28/2022 1237   TRIG 186 (H) 01/28/2022 1237   HDL 70 01/28/2022 1237   CHOLHDL 2.6 01/28/2022 1237   CHOLHDL 2.4 02/22/2012 1051   VLDL 16 02/22/2012 1051   LDLCALC 82 01/28/2022 1237   Home Medications   Current Meds  Medication Sig   aspirin 81 MG chewable tablet Chew 81 mg by mouth daily.   calcium carbonate (TUMS EX) 750 MG chewable tablet Chew 1 tablet by mouth daily as needed for heartburn.   cetirizine (ZYRTEC) 10 MG tablet Take 10 mg by mouth daily.   Cholecalciferol (VITAMIN D PO) Take 1 tablet by mouth every other day.   CYANOCOBALAMIN PO Take 1 tablet by mouth daily.   diazepam (VALIUM) 10 MG tablet Take 1 tablet (10 mg total) by mouth as needed.   fish oil-omega-3 fatty acids 1000 MG capsule Take 1 g by mouth daily.   furosemide (LASIX) 40 MG tablet Take 40 mg by mouth daily.    lisinopril (PRINIVIL,ZESTRIL) 5 MG tablet Take 5 mg by mouth daily.   metoprolol tartrate (LOPRESSOR) 50 MG tablet Take one tablet by mouth 2 hours prior to your CT   Multiple Vitamins-Minerals (WOMENS MULTIVITAMIN PO) Take by mouth.   nitroGLYCERIN (NITROSTAT) 0.4 MG SL tablet Place 1 tablet (0.4 mg total) under the tongue every 5 (five) minutes as needed for chest pain.   sodium chloride (OCEAN) 0.65 % SOLN nasal spray Place 2 sprays into both nostrils as needed for congestion.   sucralfate (CARAFATE) 1 g tablet Take 1 g by mouth as needed.   VITAMIN E PO Take 1 tablet by mouth daily.     Review of Systems      All other systems reviewed and are otherwise negative except as noted above.  Physical Exam    VS:  BP 122/70   Pulse 68   Ht 5' (1.524 m)   Wt 148 lb 3.2 oz (67.2 kg)   SpO2  99%   BMI 28.94 kg/m  , BMI Body mass index is  28.94 kg/m.  Wt Readings from Last 3 Encounters:  03/12/22 148 lb 3.2 oz (67.2 kg)  01/28/22 147 lb 3.2 oz (66.8 kg)  02/27/21 142 lb (64.4 kg)     GEN: Well nourished, well developed, in no acute distress. HEENT: normal. Neck: Supple, no JVD, carotid bruits, or masses. Cardiac: RRR, no murmurs, rubs, or gallops. No clubbing, cyanosis, edema.  Radials/PT 2+ and equal bilaterally.  Respiratory:  Respirations regular and unlabored, clear to auscultation bilaterally. GI: Soft, nontender, nondistended. MS: No deformity or atrophy. Skin: Warm and dry, no rash. Neuro:  Strength and sensation are intact. Psych: Normal affect.  Assessment & Plan    Nonobstructive CAD -She had a coronary CT scan done 01/01/2020 which showed calcium score of 8 (26 percentile for age and sex matched controls), minimal nonobstructive CAD (0 to 24%) in the proximal RCA and distal left main -Coronary CTA 02/18/2022 with mild stenosis in the distal left main and ostial LAD, low risk for future cardiac events -continue current medication regimen  Hypertension -Well-controlled today, 122/70 -Continue current medication regimen which includes Lasix 40 mg daily, lisinopril 5 mg daily, aspirin 81 mg daily  Hyperlipidemia -Most recent lipid panel 01/2022, LDL 62, HDL 70, Total cholesterol 183, Triglycerides 186 -December lipid panel -She is not currently on any medications for her cholesterol  History of orthostatic hypotension -not occurring anymore  Family history of early CAD -continue aggressive risk reduction with cholesterol surveillance -ordered coronary CTA today since symptomatic  Nodule of lower lobe of left lung -continue routine surveillance  Anxiety -valium as needed -rapid heart beat but does happen often -son is going through [employer] which causes her more anxiety -wore a monitor in the past which was unrevealing         Disposition: Follow up 9 months with Freada Bergeron,  MD or APP.  Signed, Elgie Collard, PA-C 03/12/2022, 12:21 PM [facility]

## 2022-03-11 DIAGNOSIS — H524 Presbyopia: Secondary | ICD-10-CM | POA: Diagnosis not present

## 2022-03-11 DIAGNOSIS — H40013 Open angle with borderline findings, low risk, bilateral: Secondary | ICD-10-CM | POA: Diagnosis not present

## 2022-03-11 DIAGNOSIS — H04123 Dry eye syndrome of bilateral lacrimal glands: Secondary | ICD-10-CM | POA: Diagnosis not present

## 2022-03-11 DIAGNOSIS — H16143 Punctate keratitis, bilateral: Secondary | ICD-10-CM | POA: Diagnosis not present

## 2022-03-11 DIAGNOSIS — Z961 Presence of intraocular lens: Secondary | ICD-10-CM | POA: Diagnosis not present

## 2022-03-11 DIAGNOSIS — H26492 Other secondary cataract, left eye: Secondary | ICD-10-CM | POA: Diagnosis not present

## 2022-03-12 ENCOUNTER — Ambulatory Visit: Payer: Medicare Other | Attending: Physician Assistant | Admitting: Physician Assistant

## 2022-03-12 ENCOUNTER — Encounter: Payer: Self-pay | Admitting: Physician Assistant

## 2022-03-12 VITALS — BP 122/70 | HR 68 | Ht 60.0 in | Wt 148.2 lb

## 2022-03-12 DIAGNOSIS — I10 Essential (primary) hypertension: Secondary | ICD-10-CM | POA: Diagnosis not present

## 2022-03-12 DIAGNOSIS — Z8679 Personal history of other diseases of the circulatory system: Secondary | ICD-10-CM | POA: Diagnosis not present

## 2022-03-12 DIAGNOSIS — I2510 Atherosclerotic heart disease of native coronary artery without angina pectoris: Secondary | ICD-10-CM | POA: Diagnosis not present

## 2022-03-12 DIAGNOSIS — E785 Hyperlipidemia, unspecified: Secondary | ICD-10-CM

## 2022-03-12 DIAGNOSIS — Z8249 Family history of ischemic heart disease and other diseases of the circulatory system: Secondary | ICD-10-CM

## 2022-03-12 DIAGNOSIS — R911 Solitary pulmonary nodule: Secondary | ICD-10-CM | POA: Diagnosis not present

## 2022-03-12 NOTE — Patient Instructions (Signed)
Medication Instructions:  Your physician recommends that you continue on your current medications as directed. Please refer to the Current Medication list given to you today.  *If you need a refill on your cardiac medications before your next appointment, please call your pharmacy*   Lab Work: Have your primary care provider draw a lipid panel when you see them. Ask them to fax the results to [place] at [phone]. If you have labs (blood work) drawn today and your tests are completely normal, you will receive your results only by: [place] (if you have MyChart) OR A paper copy in the mail If you have any lab test that is abnormal or we need to change your treatment, we will call you to review the results.  Follow-Up: At [facility], you and your health needs are our priority.  As part of our continuing mission to provide you with exceptional heart care, we have created designated Provider Care Teams.  These Care Teams include your primary Cardiologist (physician) and Advanced Practice Providers (APPs -  Physician Assistants and Nurse Practitioners) who all work together to provide you with the care you need, when you need it.  Your next appointment:   8 month(s)  The format for your next appointment:   In Person  Provider:   Freada Bergeron, MD    Other Instructions Have Dr Sheryn Bison address the spot on your lung.  Important Information About [place]

## 2022-03-19 DIAGNOSIS — R42 Dizziness and giddiness: Secondary | ICD-10-CM | POA: Diagnosis not present

## 2022-03-19 DIAGNOSIS — R911 Solitary pulmonary nodule: Secondary | ICD-10-CM | POA: Diagnosis not present

## 2022-03-31 DIAGNOSIS — Z124 Encounter for screening for malignant neoplasm of cervix: Secondary | ICD-10-CM | POA: Diagnosis not present

## 2022-03-31 DIAGNOSIS — Z6828 Body mass index (BMI) 28.0-28.9, adult: Secondary | ICD-10-CM | POA: Diagnosis not present

## 2022-03-31 DIAGNOSIS — Z1272 Encounter for screening for malignant neoplasm of vagina: Secondary | ICD-10-CM | POA: Diagnosis not present

## 2022-03-31 DIAGNOSIS — Z779 Other contact with and (suspected) exposures hazardous to health: Secondary | ICD-10-CM | POA: Diagnosis not present

## 2022-04-22 DIAGNOSIS — Z411 Encounter for cosmetic surgery: Secondary | ICD-10-CM | POA: Diagnosis not present

## 2022-04-22 DIAGNOSIS — L570 Actinic keratosis: Secondary | ICD-10-CM | POA: Diagnosis not present

## 2022-05-26 DIAGNOSIS — R7309 Other abnormal glucose: Secondary | ICD-10-CM | POA: Diagnosis not present

## 2022-05-26 DIAGNOSIS — I10 Essential (primary) hypertension: Secondary | ICD-10-CM | POA: Diagnosis not present

## 2022-05-26 DIAGNOSIS — E7800 Pure hypercholesterolemia, unspecified: Secondary | ICD-10-CM | POA: Diagnosis not present

## 2022-05-26 DIAGNOSIS — E559 Vitamin D deficiency, unspecified: Secondary | ICD-10-CM | POA: Diagnosis not present

## 2022-05-26 DIAGNOSIS — E7439 Other disorders of intestinal carbohydrate absorption: Secondary | ICD-10-CM | POA: Diagnosis not present

## 2022-05-28 DIAGNOSIS — I10 Essential (primary) hypertension: Secondary | ICD-10-CM | POA: Diagnosis not present

## 2022-05-28 DIAGNOSIS — E782 Mixed hyperlipidemia: Secondary | ICD-10-CM | POA: Diagnosis not present

## 2022-05-28 DIAGNOSIS — R918 Other nonspecific abnormal finding of lung field: Secondary | ICD-10-CM | POA: Diagnosis not present

## 2022-05-28 DIAGNOSIS — Z0000 Encounter for general adult medical examination without abnormal findings: Secondary | ICD-10-CM | POA: Diagnosis not present

## 2022-05-28 DIAGNOSIS — F419 Anxiety disorder, unspecified: Secondary | ICD-10-CM | POA: Diagnosis not present

## 2022-05-28 DIAGNOSIS — E7439 Other disorders of intestinal carbohydrate absorption: Secondary | ICD-10-CM | POA: Diagnosis not present

## 2022-05-28 DIAGNOSIS — K760 Fatty (change of) liver, not elsewhere classified: Secondary | ICD-10-CM | POA: Diagnosis not present

## 2022-05-28 DIAGNOSIS — K219 Gastro-esophageal reflux disease without esophagitis: Secondary | ICD-10-CM | POA: Diagnosis not present

## 2022-05-28 DIAGNOSIS — I2510 Atherosclerotic heart disease of native coronary artery without angina pectoris: Secondary | ICD-10-CM | POA: Diagnosis not present

## 2022-05-28 DIAGNOSIS — D126 Benign neoplasm of colon, unspecified: Secondary | ICD-10-CM | POA: Diagnosis not present

## 2022-05-28 DIAGNOSIS — M8580 Other specified disorders of bone density and structure, unspecified site: Secondary | ICD-10-CM | POA: Diagnosis not present

## 2022-07-20 DIAGNOSIS — R918 Other nonspecific abnormal finding of lung field: Secondary | ICD-10-CM | POA: Diagnosis not present

## 2022-07-20 DIAGNOSIS — Z7185 Encounter for immunization safety counseling: Secondary | ICD-10-CM | POA: Diagnosis not present

## 2022-07-20 DIAGNOSIS — I10 Essential (primary) hypertension: Secondary | ICD-10-CM | POA: Diagnosis not present

## 2022-07-20 DIAGNOSIS — E538 Deficiency of other specified B group vitamins: Secondary | ICD-10-CM | POA: Diagnosis not present

## 2022-07-20 DIAGNOSIS — M1990 Unspecified osteoarthritis, unspecified site: Secondary | ICD-10-CM | POA: Diagnosis not present

## 2022-07-27 ENCOUNTER — Ambulatory Visit: Payer: Medicare Other | Admitting: Podiatry

## 2022-08-03 DIAGNOSIS — H9202 Otalgia, left ear: Secondary | ICD-10-CM | POA: Diagnosis not present

## 2022-08-03 DIAGNOSIS — M79604 Pain in right leg: Secondary | ICD-10-CM | POA: Diagnosis not present

## 2022-08-04 ENCOUNTER — Other Ambulatory Visit (HOSPITAL_BASED_OUTPATIENT_CLINIC_OR_DEPARTMENT_OTHER): Payer: Self-pay | Admitting: Family Medicine

## 2022-08-04 DIAGNOSIS — M79604 Pain in right leg: Secondary | ICD-10-CM

## 2022-08-05 ENCOUNTER — Ambulatory Visit (HOSPITAL_BASED_OUTPATIENT_CLINIC_OR_DEPARTMENT_OTHER)
Admission: RE | Admit: 2022-08-05 | Discharge: 2022-08-05 | Disposition: A | Discharge status: Home / Self Care | Payer: Medicare Other | Source: Ambulatory Visit | Attending: Family Medicine | Admitting: Family Medicine

## 2022-08-05 ENCOUNTER — Ambulatory Visit (HOSPITAL_BASED_OUTPATIENT_CLINIC_OR_DEPARTMENT_OTHER): Payer: Medicare Other

## 2022-08-05 DIAGNOSIS — M79604 Pain in right leg: Secondary | ICD-10-CM

## 2022-08-05 DIAGNOSIS — M1711 Unilateral primary osteoarthritis, right knee: Secondary | ICD-10-CM | POA: Diagnosis not present

## 2022-08-05 DIAGNOSIS — M79661 Pain in right lower leg: Secondary | ICD-10-CM | POA: Diagnosis not present

## 2022-08-05 DIAGNOSIS — M25551 Pain in right hip: Secondary | ICD-10-CM | POA: Diagnosis not present

## 2022-08-09 DIAGNOSIS — R42 Dizziness and giddiness: Secondary | ICD-10-CM | POA: Diagnosis not present

## 2022-08-09 DIAGNOSIS — H8111 Benign paroxysmal vertigo, right ear: Secondary | ICD-10-CM | POA: Diagnosis not present

## 2022-08-17 ENCOUNTER — Ambulatory Visit: Payer: Medicare Other | Admitting: Podiatry

## 2022-08-19 ENCOUNTER — Ambulatory Visit (INDEPENDENT_AMBULATORY_CARE_PROVIDER_SITE_OTHER): Payer: Medicare Other | Admitting: Podiatry

## 2022-08-19 DIAGNOSIS — M722 Plantar fascial fibromatosis: Secondary | ICD-10-CM | POA: Diagnosis not present

## 2022-08-19 NOTE — Progress Notes (Signed)
Subjective:  Patient ID: Katelyn Taylor, female    DOB: June 17, 1940,  MRN: [id number]  Chief Complaint  Patient presents with   Plantar Fasciitis    82 y.o. female presents with the above complaint.  Patient presents with complaint bilateral heel pain that has been on for quite some time is progressive gotten worse.  She started notice some Planter fasciitis type of pain coming back.  She states that she has a history of plantars fasciitis pain scale 7 out of 10 dull achy in nature hurts with taking for step in the morning.  She has not seen anyone else prior to seeing me for this.  She would like to discuss treatment options.   Review of Systems: Negative except as noted in the HPI. Denies N/V/F/Ch.  Past Medical History:  Diagnosis Date   Allergic rhinitis    Anticoagulated    Anxiety    Arthritis    Back pain    Cataracts, bilateral    Chronic gastritis    Clotting disorder (HCC)    Depression    DVT (deep venous thrombosis) (HCC)    Estrogen deficiency    External hemorrhoid    Fatty liver    GERD (gastroesophageal reflux disease)    Hiatal hernia    Hx of migraines    Hypercholesterolemia    Hypertension    IBS (irritable bowel syndrome)    Neck pain    Osteopenia 08/2014   T score -2.1 FRAX 13%/1.5%   Panic attacks    Severe dysplasia of cervix (CIN III)    Shingles    Stress    Vitamin D deficiency     Current Outpatient Medications:    aspirin 81 MG chewable tablet, Chew 81 mg by mouth daily., Disp: , Rfl:    calcium carbonate (TUMS EX) 750 MG chewable tablet, Chew 1 tablet by mouth daily as needed for heartburn., Disp: , Rfl:    cetirizine (ZYRTEC) 10 MG tablet, Take 10 mg by mouth daily., Disp: , Rfl:    Cholecalciferol (VITAMIN D PO), Take 1 tablet by mouth every other day., Disp: , Rfl:    CYANOCOBALAMIN PO, Take 1 tablet by mouth daily., Disp: , Rfl:    diazepam (VALIUM) 10 MG tablet, Take 1 tablet (10 mg total) by mouth as needed., Disp: 30 tablet, Rfl:  3   fish oil-omega-3 fatty acids 1000 MG capsule, Take 1 g by mouth daily., Disp: , Rfl:    furosemide (LASIX) 40 MG tablet, Take 40 mg by mouth daily. , Disp: , Rfl:    lisinopril (PRINIVIL,ZESTRIL) 5 MG tablet, Take 5 mg by mouth daily., Disp: , Rfl:    metoprolol tartrate (LOPRESSOR) 50 MG tablet, Take one tablet by mouth 2 hours prior to your CT, Disp: 1 tablet, Rfl: 0   Multiple Vitamins-Minerals (WOMENS MULTIVITAMIN PO), Take by mouth., Disp: , Rfl:    nitroGLYCERIN (NITROSTAT) 0.4 MG SL tablet, Place 1 tablet (0.4 mg total) under the tongue every 5 (five) minutes as needed for chest pain., Disp: 25 tablet, Rfl: 5   sodium chloride (OCEAN) 0.65 % SOLN nasal spray, Place 2 sprays into both nostrils as needed for congestion., Disp: , Rfl:    sucralfate (CARAFATE) 1 g tablet, Take 1 g by mouth as needed., Disp: , Rfl:    VITAMIN E PO, Take 1 tablet by mouth daily., Disp: , Rfl:   Social History   Tobacco Use  Smoking Status Former   Types: Cigarettes  Smokeless  Tobacco Never    Allergies  Allergen Reactions   Amoxicillin Other (See Comments)    Yeast infection   Cephalexin Other (See Comments)    headache   Codeine Nausea And Vomiting   Nsaids Other (See Comments)    Headache, can take aspirin   Penicillins Other (See Comments)    Yeast infections   Sulfonamide Derivatives Other (See Comments)    headache   Objective:  There were no vitals filed for this visit. There is no height or weight on file to calculate BMI. Constitutional Well developed. Well nourished.  Vascular Dorsalis pedis pulses palpable bilaterally. Posterior tibial pulses palpable bilaterally. Capillary refill normal to all digits.  No cyanosis or clubbing noted. Pedal hair growth normal.  Neurologic Normal speech. Oriented to person, place, and time. Epicritic sensation to light touch grossly present bilaterally.  Dermatologic Nails well groomed and normal in appearance. No open wounds. No skin  lesions.  Orthopedic: Normal joint ROM without pain or crepitus bilaterally. No visible deformities. Tender to palpation at the calcaneal tuber bilaterally. No pain with calcaneal squeeze bilaterally. Ankle ROM diminished range of motion bilaterally. [place] Test: positive bilaterally.   Radiographs: None  Assessment:   1. Plantar fasciitis of right foot   2. Plantar fasciitis of left foot    Plan:  Patient was evaluated and treated and all questions answered.  Plantar Fasciitis, bilaterally - XR reviewed as above.  - Educated on icing and stretching. Instructions given.  - Injection delivered to the plantar fascia as below. - DME: Plantar fascial brace dispensed to support the medial longitudinal arch of the foot and offload pressure from the heel and prevent arch collapse during weightbearing - Pharmacologic management: None  Procedure: Injection Tendon/Ligament Location: Bilateral plantar fascia at the glabrous junction; medial approach. Skin Prep: alcohol Injectate: 0.5 cc 0.5% marcaine plain, 0.5 cc of 1% Lidocaine, 0.5 cc kenalog 10. Disposition: Patient tolerated procedure well. Injection site dressed with a band-aid.  No follow-ups on file.

## 2022-08-31 ENCOUNTER — Other Ambulatory Visit: Payer: Self-pay | Admitting: Family Medicine

## 2022-08-31 DIAGNOSIS — R918 Other nonspecific abnormal finding of lung field: Secondary | ICD-10-CM

## 2022-09-01 ENCOUNTER — Other Ambulatory Visit: Payer: Self-pay | Admitting: Gastroenterology

## 2022-09-03 DIAGNOSIS — R1084 Generalized abdominal pain: Secondary | ICD-10-CM | POA: Diagnosis not present

## 2022-10-01 ENCOUNTER — Ambulatory Visit: Payer: Medicare Other | Admitting: Podiatry

## 2022-10-01 ENCOUNTER — Ambulatory Visit
Admission: RE | Admit: 2022-10-01 | Discharge: 2022-10-01 | Disposition: A | Discharge status: Home / Self Care | Payer: Medicare Other | Source: Ambulatory Visit | Attending: Family Medicine | Admitting: Family Medicine

## 2022-10-01 DIAGNOSIS — R918 Other nonspecific abnormal finding of lung field: Secondary | ICD-10-CM | POA: Diagnosis not present

## 2022-10-01 DIAGNOSIS — R911 Solitary pulmonary nodule: Secondary | ICD-10-CM | POA: Diagnosis not present

## 2022-10-04 DIAGNOSIS — H40013 Open angle with borderline findings, low risk, bilateral: Secondary | ICD-10-CM | POA: Diagnosis not present

## 2022-10-04 DIAGNOSIS — H16143 Punctate keratitis, bilateral: Secondary | ICD-10-CM | POA: Diagnosis not present

## 2022-10-05 ENCOUNTER — Ambulatory Visit (INDEPENDENT_AMBULATORY_CARE_PROVIDER_SITE_OTHER): Payer: Medicare Other | Admitting: Podiatry

## 2022-10-05 DIAGNOSIS — L658 Other specified nonscarring hair loss: Secondary | ICD-10-CM | POA: Diagnosis not present

## 2022-10-05 DIAGNOSIS — D485 Neoplasm of uncertain behavior of skin: Secondary | ICD-10-CM | POA: Diagnosis not present

## 2022-10-05 DIAGNOSIS — C44321 Squamous cell carcinoma of skin of nose: Secondary | ICD-10-CM | POA: Diagnosis not present

## 2022-10-05 DIAGNOSIS — M722 Plantar fascial fibromatosis: Secondary | ICD-10-CM | POA: Diagnosis not present

## 2022-10-05 DIAGNOSIS — L570 Actinic keratosis: Secondary | ICD-10-CM | POA: Diagnosis not present

## 2022-10-05 NOTE — Progress Notes (Signed)
Subjective:  Patient ID: Katelyn Taylor, female    DOB: [birth year],  MRN: [id number]  Chief Complaint  Patient presents with   Plantar Fasciitis    Pt stated that her foot is doing better     82 y.o. female presents with the above complaint.  Patient presents for follow-up of bilateral Planter fasciitis.  She states that she is doing a lot better.  Injection helped considerably.  Denies any other acute complaints   Review of Systems: Negative except as noted in the HPI. Denies N/V/F/Ch.  Past Medical History:  Diagnosis Date   Allergic rhinitis    Anticoagulated    Anxiety    Arthritis    Back pain    Cataracts, bilateral    Chronic gastritis    Clotting disorder (HCC)    Depression    DVT (deep venous thrombosis) (HCC)    Estrogen deficiency    External hemorrhoid    Fatty liver    GERD (gastroesophageal reflux disease)    Hiatal hernia    Hx of migraines    Hypercholesterolemia    Hypertension    IBS (irritable bowel syndrome)    Neck pain    Osteopenia 08/2014   T score -2.1 FRAX 13%/1.5%   Panic attacks    Severe dysplasia of cervix (CIN III)    Shingles    Stress    Vitamin D deficiency     Current Outpatient Medications:    aspirin 81 MG chewable tablet, Chew 81 mg by mouth daily., Disp: , Rfl:    calcium carbonate (TUMS EX) 750 MG chewable tablet, Chew 1 tablet by mouth daily as needed for heartburn., Disp: , Rfl:    cetirizine (ZYRTEC) 10 MG tablet, Take 10 mg by mouth daily., Disp: , Rfl:    Cholecalciferol (VITAMIN D PO), Take 1 tablet by mouth every other day., Disp: , Rfl:    CYANOCOBALAMIN PO, Take 1 tablet by mouth daily., Disp: , Rfl:    diazepam (VALIUM) 10 MG tablet, Take 1 tablet (10 mg total) by mouth as needed., Disp: 30 tablet, Rfl: 3   fish oil-omega-3 fatty acids 1000 MG capsule, Take 1 g by mouth daily., Disp: , Rfl:    furosemide (LASIX) 40 MG tablet, Take 40 mg by mouth daily. , Disp: , Rfl:    lisinopril (PRINIVIL,ZESTRIL) 5 MG tablet,  Take 5 mg by mouth daily., Disp: , Rfl:    metoprolol tartrate (LOPRESSOR) 50 MG tablet, Take one tablet by mouth 2 hours prior to your CT, Disp: 1 tablet, Rfl: 0   Multiple Vitamins-Minerals (WOMENS MULTIVITAMIN PO), Take by mouth., Disp: , Rfl:    nitroGLYCERIN (NITROSTAT) 0.4 MG SL tablet, Place 1 tablet (0.4 mg total) under the tongue every 5 (five) minutes as needed for chest pain., Disp: 25 tablet, Rfl: 5   sodium chloride (OCEAN) 0.65 % SOLN nasal spray, Place 2 sprays into both nostrils as needed for congestion., Disp: , Rfl:    sucralfate (CARAFATE) 1 g tablet, Take 1 g by mouth as needed., Disp: , Rfl:    VITAMIN E PO, Take 1 tablet by mouth daily., Disp: , Rfl:   Social History   Tobacco Use  Smoking Status Former   Types: Cigarettes  Smokeless Tobacco Never    Allergies  Allergen Reactions   Amoxicillin Other (See Comments)    Yeast infection   Cephalexin Other (See Comments)    headache   Codeine Nausea And Vomiting   Nsaids Other (See  Comments)    Headache, can take aspirin   Penicillins Other (See Comments)    Yeast infections   Sulfonamide Derivatives Other (See Comments)    headache   Objective:  There were no vitals filed for this visit. There is no height or weight on file to calculate BMI. Constitutional Well developed. Well nourished.  Vascular Dorsalis pedis pulses palpable bilaterally. Posterior tibial pulses palpable bilaterally. Capillary refill normal to all digits.  No cyanosis or clubbing noted. Pedal hair growth normal.  Neurologic Normal speech. Oriented to person, place, and time. Epicritic sensation to light touch grossly present bilaterally.  Dermatologic Nails well groomed and normal in appearance. No open wounds. No skin lesions.  Orthopedic: Normal joint ROM without pain or crepitus bilaterally. No visible deformities. No further tender to palpation at the calcaneal tuber bilaterally. None no further no pain with calcaneal squeeze  bilaterally. Ankle ROM diminished range of motion bilaterally. Silfverskiold Test: positive bilaterally.   Radiographs: None  Assessment:   1. Plantar fasciitis of right foot   2. Plantar fasciitis of left foot     Plan:  Patient was evaluated and treated and all questions answered.  Plantar Fasciitis, bilaterally -Clinically doing much better.  At this time patient not experiencing gross pain.  She will come back and see me if anything happens.  I discussed shoe gear modification orthotics management if any foot and ankle issues on future she will come back and see me..  No follow-ups on file.

## 2022-10-12 DIAGNOSIS — C44321 Squamous cell carcinoma of skin of nose: Secondary | ICD-10-CM | POA: Diagnosis not present

## 2022-10-21 DIAGNOSIS — L700 Acne vulgaris: Secondary | ICD-10-CM | POA: Diagnosis not present

## 2022-10-21 DIAGNOSIS — L821 Other seborrheic keratosis: Secondary | ICD-10-CM | POA: Diagnosis not present

## 2022-10-22 DIAGNOSIS — Z9109 Other allergy status, other than to drugs and biological substances: Secondary | ICD-10-CM | POA: Diagnosis not present

## 2022-10-22 DIAGNOSIS — F419 Anxiety disorder, unspecified: Secondary | ICD-10-CM | POA: Diagnosis not present

## 2022-11-02 DIAGNOSIS — Z85828 Personal history of other malignant neoplasm of skin: Secondary | ICD-10-CM | POA: Diagnosis not present

## 2022-11-02 DIAGNOSIS — Z5189 Encounter for other specified aftercare: Secondary | ICD-10-CM | POA: Diagnosis not present

## 2022-11-13 DIAGNOSIS — Z1231 Encounter for screening mammogram for malignant neoplasm of breast: Secondary | ICD-10-CM | POA: Diagnosis not present

## 2022-11-23 DIAGNOSIS — R059 Cough, unspecified: Secondary | ICD-10-CM | POA: Diagnosis not present

## 2022-11-29 ENCOUNTER — Ambulatory Visit
Admission: RE | Admit: 2022-11-29 | Discharge: 2022-11-29 | Disposition: A | Discharge status: Home / Self Care | Payer: Medicare Other | Source: Ambulatory Visit | Attending: Family Medicine | Admitting: Family Medicine

## 2022-11-29 ENCOUNTER — Other Ambulatory Visit: Payer: Self-pay | Admitting: Family Medicine

## 2022-11-29 DIAGNOSIS — J398 Other specified diseases of upper respiratory tract: Secondary | ICD-10-CM

## 2022-11-29 DIAGNOSIS — R059 Cough, unspecified: Secondary | ICD-10-CM | POA: Diagnosis not present

## 2022-11-29 DIAGNOSIS — R0981 Nasal congestion: Secondary | ICD-10-CM | POA: Diagnosis not present

## 2022-12-04 IMAGING — CT CT CHEST W/O CM
1 of 2 series · 15 of 32 positions shown, 19 images · non-contrast
Comparison: 60 prior CT scan of the chest 03/03/2021

CLINICAL DATA: Pulmonary nodule follow-up



[Series 4: super d · axial · 0.65mm/px · z∈[+885,+1156]mm · 15 of 379 slices shown, 19 images]
[im 20/379  mediastinal]
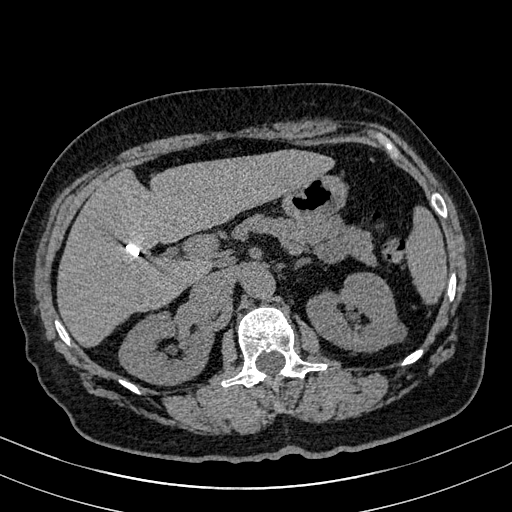
[im 20/379  lung]
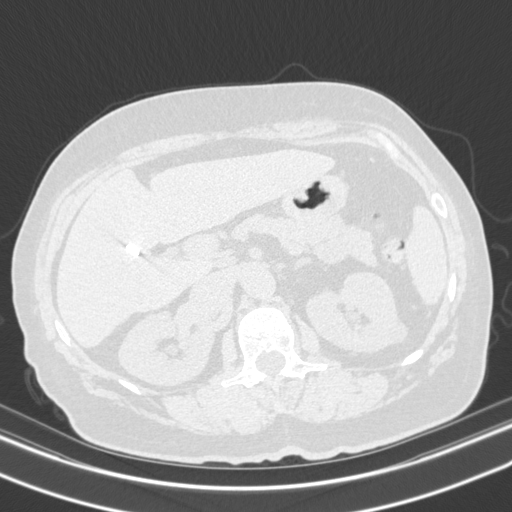
[im 60/379  lung]
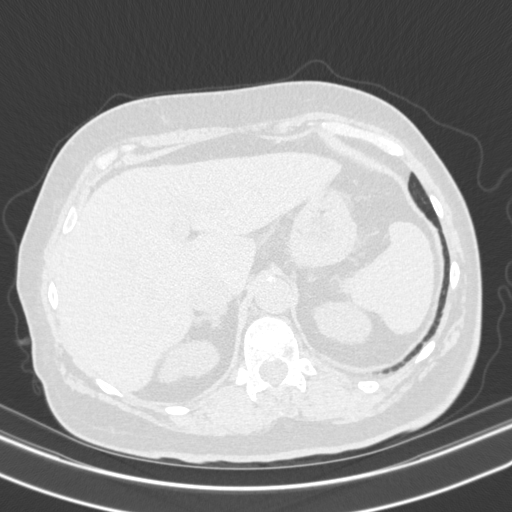
[im 80/379  lung]
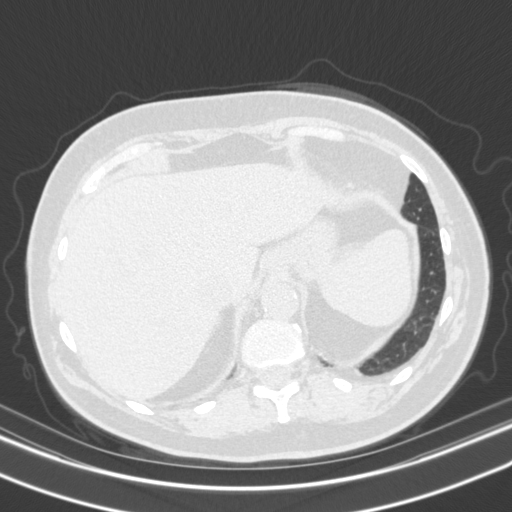
[im 100/379  lung]
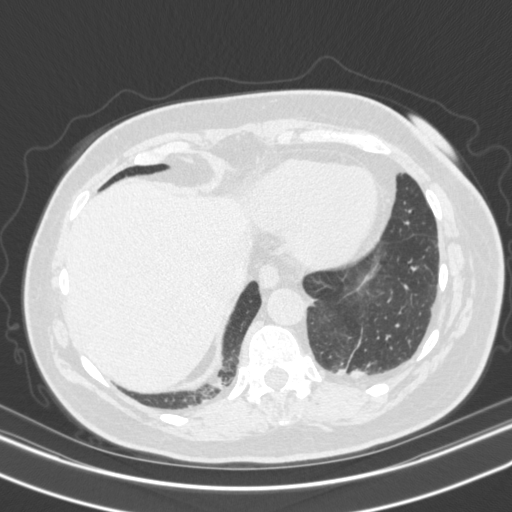
[im 127/379  mediastinal]
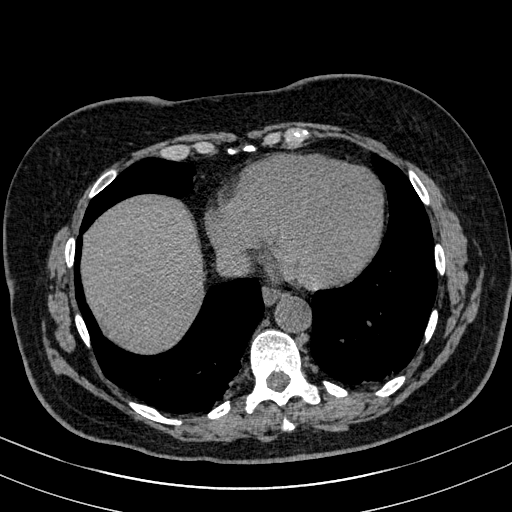
[im 127/379  lung]
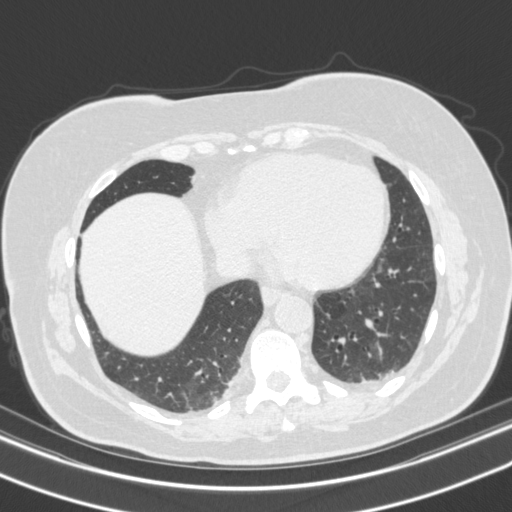
[im 140/379  lung]
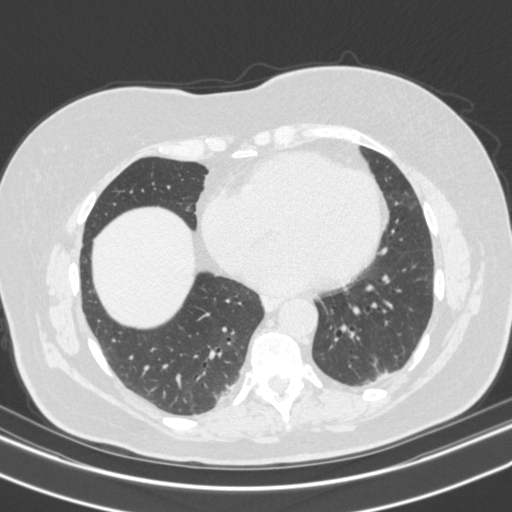
[im 178/379  lung]
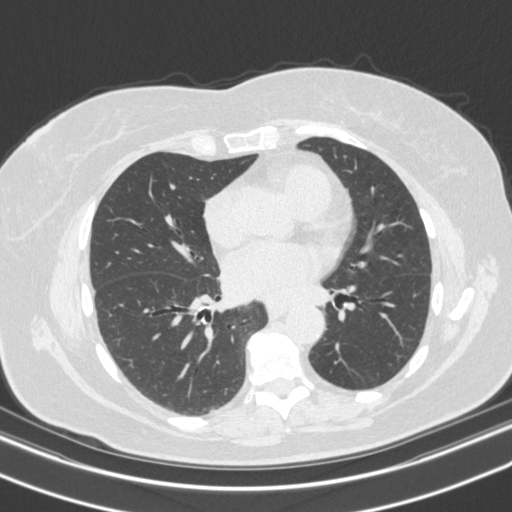
[im 180/379  lung]
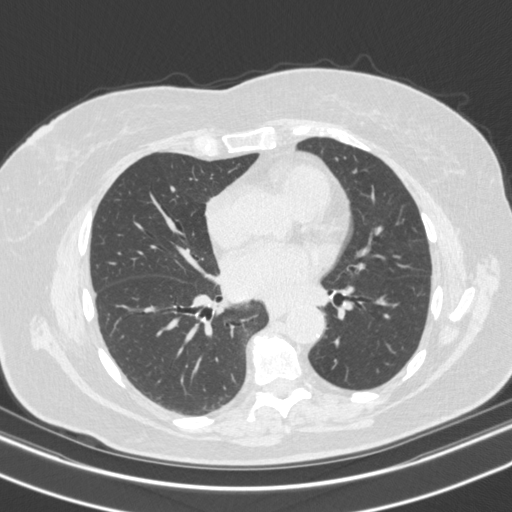
[im 199/379  mediastinal]
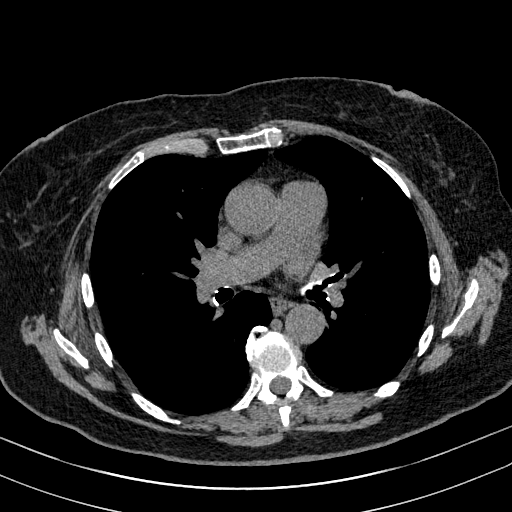
[im 199/379  lung]
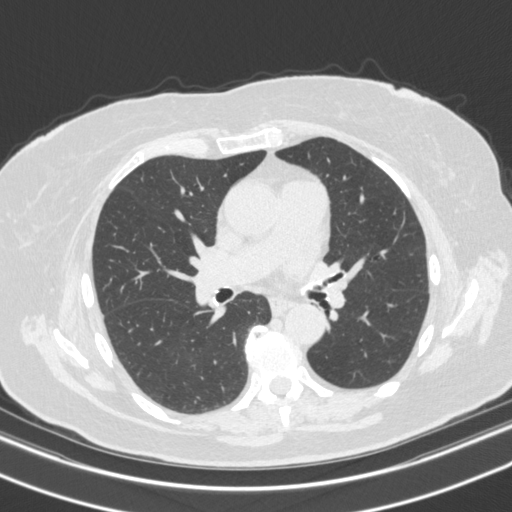
[im 239/379  lung]
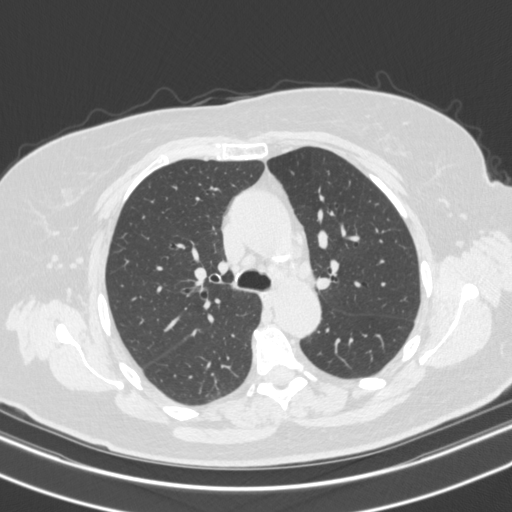
[im 253/379  lung]
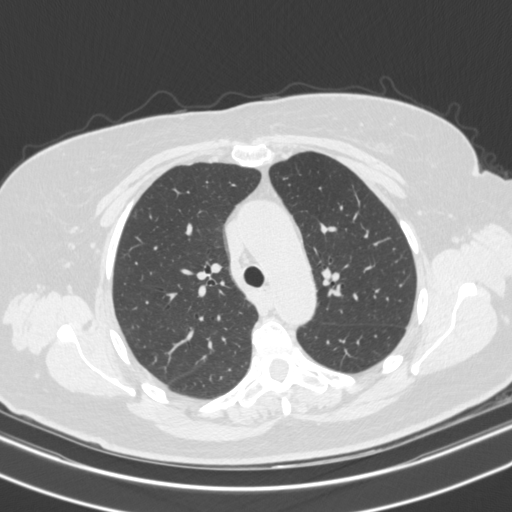
[im 279/379  lung]
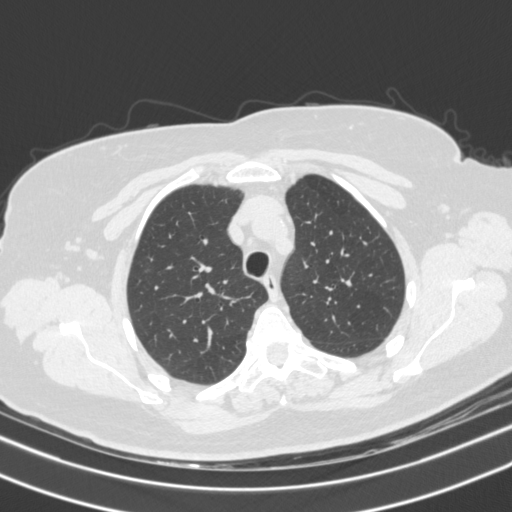
[im 299/379  mediastinal]
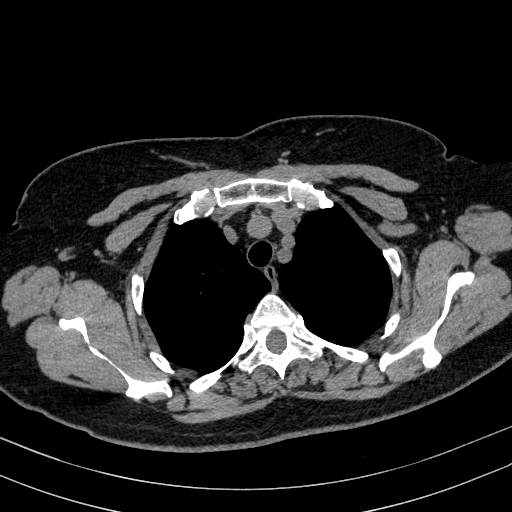
[im 299/379  lung]
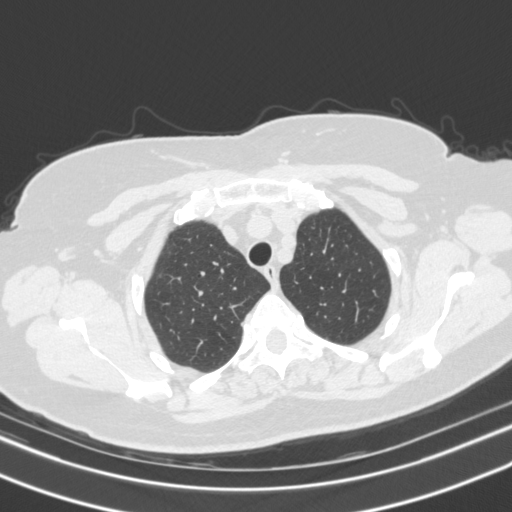
[im 319/379  lung]
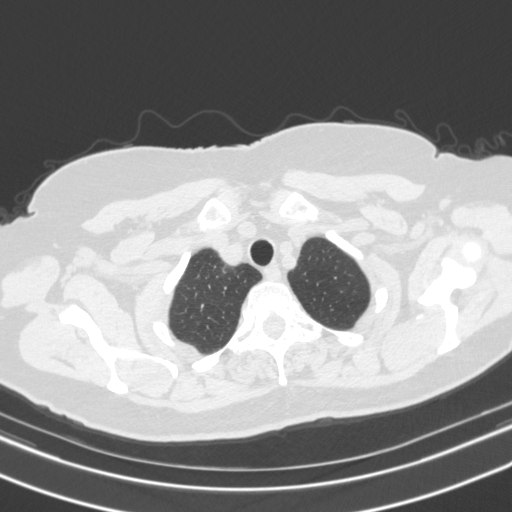
[im 359/379  lung]
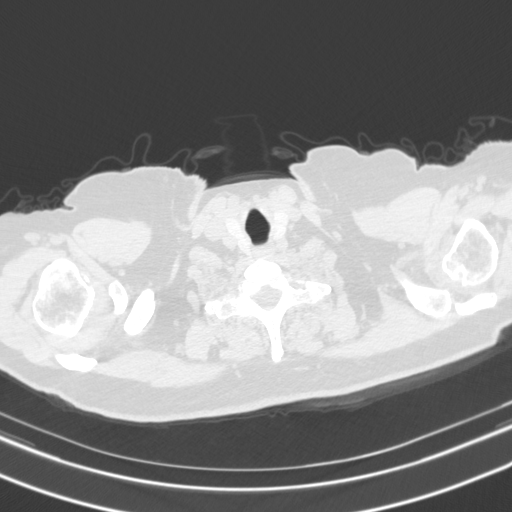

[15 of 32 positions shown; findings below may reference images not displayed]

FINDINGS: Cardiovascular: Limited evaluation in the absence of intravenous
contrast. 2 vessel aortic arch. Right brachiocephalic and left
common carotid artery share a common origin. Scattered
atherosclerotic vascular calcifications along the aorta and coronary
arteries. The heart is normal in size. No pericardial effusion.

Mediastinum/Nodes: Unremarkable CT appearance of the thyroid gland.
No suspicious mediastinal or hilar adenopathy. No soft tissue
mediastinal mass. The thoracic esophagus is unremarkable.

Lungs/Pleura: Stable subpleural pulmonary parenchymal scarring along
the posteromedial aspect of the left lower lobe across multiple
prior studies most consistent with a benign process. Tiny calcified
granuloma in the posterior left lower lobe again noted. No
suspicious pulmonary mass or nodule. Mild dependent
atelectasis/scarring also present within the medial aspect of the
right lower lobe. Small right lower lobe subpleural pulmonary
nodules measuring less than 5 mm are again noted and demonstrate no
interval change.

Upper Abdomen: No acute abnormality.

Musculoskeletal: No chest wall mass or suspicious bone lesions
identified.
IMPRESSION: 1. Continued stability of subpleural nodularity along the
posteromedial aspect of the left lower lobe across multiple prior
studies. Findings are most consistent with pleuroparenchymal
scarring.
2. Similar but less conspicuous chronic atelectasis/scarring present
along the posteromedial aspect of the right lower lobe.
3. No acute cardiopulmonary process.
4. Aortic and coronary artery atherosclerotic calcifications.

Aortic Atherosclerosis ([id number]-J05.5).

## 2022-12-17 DIAGNOSIS — E7439 Other disorders of intestinal carbohydrate absorption: Secondary | ICD-10-CM | POA: Diagnosis not present

## 2022-12-17 DIAGNOSIS — I10 Essential (primary) hypertension: Secondary | ICD-10-CM | POA: Diagnosis not present

## 2022-12-17 DIAGNOSIS — H9202 Otalgia, left ear: Secondary | ICD-10-CM | POA: Diagnosis not present

## 2022-12-17 DIAGNOSIS — R7303 Prediabetes: Secondary | ICD-10-CM | POA: Diagnosis not present

## 2022-12-21 ENCOUNTER — Other Ambulatory Visit: Payer: Self-pay | Admitting: Family Medicine

## 2022-12-21 DIAGNOSIS — H9202 Otalgia, left ear: Secondary | ICD-10-CM

## 2023-01-11 ENCOUNTER — Other Ambulatory Visit: Payer: Medicare Other

## 2023-02-09 ENCOUNTER — Ambulatory Visit
Admission: RE | Admit: 2023-02-09 | Discharge: 2023-02-09 | Disposition: A | Discharge status: Home / Self Care | Payer: Medicare Other | Source: Ambulatory Visit | Attending: Family Medicine | Admitting: Family Medicine

## 2023-02-09 DIAGNOSIS — R42 Dizziness and giddiness: Secondary | ICD-10-CM | POA: Diagnosis not present

## 2023-02-09 DIAGNOSIS — H9202 Otalgia, left ear: Secondary | ICD-10-CM

## 2023-02-09 MED ORDER — GADOPICLENOL 0.5 MMOL/ML IV SOLN
7.0000 mL | Freq: Once | INTRAVENOUS | Status: AC | PRN
  Administered 2023-02-09: 7 mL via INTRAVENOUS

## 2023-02-25 ENCOUNTER — Ambulatory Visit (INDEPENDENT_AMBULATORY_CARE_PROVIDER_SITE_OTHER): Payer: Medicare Other | Admitting: Cardiology

## 2023-02-25 ENCOUNTER — Encounter (HOSPITAL_BASED_OUTPATIENT_CLINIC_OR_DEPARTMENT_OTHER): Payer: Self-pay | Admitting: Cardiology

## 2023-02-25 VITALS — BP 128/70 | HR 66 | Ht 60.0 in | Wt 145.8 lb

## 2023-02-25 DIAGNOSIS — R002 Palpitations: Secondary | ICD-10-CM

## 2023-02-25 DIAGNOSIS — I2510 Atherosclerotic heart disease of native coronary artery without angina pectoris: Secondary | ICD-10-CM

## 2023-02-25 DIAGNOSIS — I493 Ventricular premature depolarization: Secondary | ICD-10-CM

## 2023-02-25 DIAGNOSIS — I498 Other specified cardiac arrhythmias: Secondary | ICD-10-CM

## 2023-02-25 DIAGNOSIS — E782 Mixed hyperlipidemia: Secondary | ICD-10-CM

## 2023-02-25 DIAGNOSIS — I10 Essential (primary) hypertension: Secondary | ICD-10-CM | POA: Diagnosis not present

## 2023-02-25 MED ORDER — ROSUVASTATIN CALCIUM 5 MG PO TABS: 5.0000 mg | ORAL_TABLET | Freq: Every day | ORAL | 3 refills | Status: DC

## 2023-02-25 NOTE — Patient Instructions (Addendum)
Medication Instructions:  START- Crestor 5 mg by mouth daily  *If you need a refill on your cardiac medications before your next appointment, please call your pharmacy*   Lab Work: Fasting Lipid, BMP, TSH and Magnesium Today Fasting Lipid and Liver in 3 Months  If you have labs (blood work) drawn today and your tests are completely normal, you will receive your results only by: MyChart Message (if you have MyChart) OR A paper copy in the mail If you have any lab test that is abnormal or we need to change your treatment, we will call you to review the results.   Testing/Procedures: None Ordered   Follow-Up: At [facility], you and your health needs are our priority.  As part of our continuing mission to provide you with exceptional heart care, we have created designated Provider Care Teams.  These Care Teams include your primary Cardiologist (physician) and Advanced Practice Providers (APPs -  Physician Assistants and Nurse Practitioners) who all work together to provide you with the care you need, when you need it.  We recommend signing up for the patient portal called "MyChart".  Sign up information is provided on this After Visit Summary.  MyChart is used to connect with patients for Virtual Visits (Telemedicine).  Patients are able to view lab/test results, encounter notes, upcoming appointments, etc.  Non-urgent messages can be sent to your provider as well.   To learn more about what you can do with MyChart, go to [email or web address].    Your next appointment:   6 month(s)  Provider:   Jodelle Red, MD    Other Instructions

## 2023-02-25 NOTE — Progress Notes (Signed)
Cardiology Office Note:  .    Date:  02/25/2023  ID:  Katelyn Taylor, DOB Apr 10, 1941, MRN [id number] PCP: Henrine Screws, MD  [facility] Providers Cardiologist:  Jodelle Red, MD     History of Present Illness: Katelyn Taylor is a 82 y.o. female with a hx of CAD, hypertension, orthostatic hypotension, hyperlipidemia, PE, anxiety, family history of CAD, here for follow-up and to establish care with me. She is formerly a patient of Dr Shari Prows, last seen by her 10/21/2020. At that visit she was working on lifestyle modifications and had lost 23 lbs. Blood pressures were well controlled on lisinopril 5 mg daily.   Cardiac Hx: Originally seen by Dr. Delton See in 2021 for chest pain. She had a coronary CTA 12/2019 with coronary calcium score of 8, minimal nonobstructive CAD (0-24% in proximal RCA and distal left main). Her chest pain was felt to be noncardiac. TTE showed LVEF 60-65% with moderate concentric LVH and grade 1 diastolic dysfunction.   Seen by Jari Favre 01/28/22 after a fall in 11/2021 with evaluation in the ER and unremarkable CT imaging. However, she complained of headaches and new squeezing pain starting in her neck and radiating to her chest. Repeat coronary CTA 02/2022 with coronary calcium score 33, mild stenosis in distal left main and ostial LAD.  Cardiovascular risk factors: Prior clinical ASCVD: CAD, coronary calcium score 33 per CTA 02/2022. Comorbid conditions: Hypertension. Hyperlipidemia. Metabolic syndrome/Obesity: Current weight 145 lbs. Chronic inflammatory conditions: none Tobacco use history:  Former smoker Family history: CAD Prior pertinent testing and/or incidental findings: As noted above. Exercise level: She goes out and walks every day, for 1.6 miles. Complains of a lot of muscle cramps, she states they may either be due to walking or her diet Current diet:  "I don't eat right." Drinks 1 cup of coffee every day. She has been adding collagen  with peptides to her coffee, biotin, for hair growth treatments. Also taking fish oil/omega 3, B12 supplements.  Today, she complains of occasional racing heart beats. She thinks it may be due to anxiety as she may be watching the news on TV when it occurs. Sometimes occurs after drinking certain wines. She did have some PVC's seen on EKG today. Takes diazepam for anxiety, seems to improve her palpitations as well.  Confirms that she takes 81 mg ASA, no bleeding issues. At times she feels the need to double up on her dose.  She reports having a blood clot in the 1970's, was initially told that it was caused from depression. Traveled from groin to left lung. Now has yearly imaging of her left lung.  Additionally she complains of insomnia usually because of her mind racing at night. Sometimes sleeps well and sometimes only has a couple hours of sleep.  Has some LE edema, usually worse when sitting with legs not elevated. Stable on Lasix.  She denies any chest pain, shortness of breath, peripheral edema, lightheadedness, headaches, syncope, orthopnea, or PND.  ROS:  Please see the history of present illness. ROS otherwise negative except as noted.  (+) Racing palpitations (+) Stress/Anxiety (+) Muscle cramps (+) Insomnia  Studies Reviewed: Marland Kitchen    EKG Interpretation Date/Time:  Friday February 25 2023 10:39:43 EDT Ventricular Rate:  66 PR Interval:  186 QRS Duration:  84 QT Interval:  364 QTC Calculation: 381 R Axis:   21  Text Interpretation: Sinus rhythm with occasional Premature ventricular complexes with ventricular escape complexes Confirmed by  Jodelle Red [phone]) on 02/25/2023 10:47:50 AM    Coronary CTA  02/18/2022: IMPRESSION: 1. Coronary calcium score 33 Agatston units. The places the patient in the 33rd percentile for age and gender, suggesting low risk for future cardiac events.   2.  Mild stenosis in the distal left main and ostial LAD.  Physical Exam:    VS:   BP 128/70   Pulse 66   Ht 5' (1.524 m)   Wt 145 lb 12.8 oz (66.1 kg)   SpO2 97%   BMI 28.47 kg/m    Wt Readings from Last 3 Encounters:  02/25/23 145 lb 12.8 oz (66.1 kg)  03/12/22 148 lb 3.2 oz (67.2 kg)  01/28/22 147 lb 3.2 oz (66.8 kg)    GEN: Well nourished, well developed in no acute distress HEENT: Normal, moist mucous membranes NECK: No JVD CARDIAC: regular rhythm with occasional early beats, normal S1 and S2, no rubs or gallops. No murmur. VASCULAR: Radial and DP pulses 2+ bilaterally. No carotid bruits RESPIRATORY:  Clear to auscultation without rales, wheezing or rhonchi  ABDOMEN: Soft, non-tender, non-distended MUSCULOSKELETAL:  Ambulates independently SKIN: Warm and dry, no edema NEUROLOGIC:  Alert and oriented x 3. No focal neuro deficits noted. PSYCHIATRIC:  Normal affect   ASSESSMENT AND PLAN: .    Palpitations PVCs on ECG -discussed today -check bmet, tsh, mg -discussed monitor today. She would like to hold off on this, will contact me if symptoms worsen -reviewed echo, no high risk features  Hypertension -on furosemide, lisinopril, at goal  Nonobstructive CAD Mixed hyperlipidemia Possible prior infarct on brain imaging -CT coronary reviewed -last lipids 01/28/22 with TG 186, LDL 82. Goal LDL <70 but given age, discussed statins and lack of guidelines -check lipids -tolerating aspirin -takes OTC fish oil  Remote DVT/PE in the 1970s  Dispo: Follow-up in 6 months, or sooner as needed.  I,Mathew Stumpf,acting as a Neurosurgeon for [employer], MD.,have documented all relevant documentation on the behalf of Jodelle Red, MD,as directed by  Jodelle Red, MD while in the presence of Jodelle Red, MD.  I, Jodelle Red, MD, have reviewed all documentation for this visit. The documentation on 04/11/23 for the exam, diagnosis, procedures, and orders are all accurate and complete.   Signed, Jodelle Red, MD

## 2023-02-26 LAB — BASIC METABOLIC PANEL
BUN/Creatinine Ratio: 27 (ref 12–28)
BUN: 22 mg/dL (ref 8–27)
CO2: 26 mmol/L (ref 20–29)
Calcium: 9.7 mg/dL (ref 8.7–10.3)
Chloride: 98 mmol/L (ref 96–106)
Creatinine, Ser: 0.83 mg/dL (ref 0.57–1.00)
Glucose: 93 mg/dL (ref 70–99)
Potassium: 4.5 mmol/L (ref 3.5–5.2)
Sodium: 138 mmol/L (ref 134–144)
eGFR: 71 mL/min/{1.73_m2} (ref >59)

## 2023-02-26 LAB — LIPID PANEL
Chol/HDL Ratio: 2.3 ratio (ref 0.0–4.4)
Cholesterol, Total: 168 mg/dL (ref 100–199)
HDL: 74 mg/dL (ref >39)
LDL Chol Calc (NIH): 76 mg/dL (ref 0–99)
Triglycerides: 102 mg/dL (ref 0–149)
VLDL Cholesterol Cal: 18 mg/dL (ref 5–40)

## 2023-02-26 LAB — MAGNESIUM: Magnesium: 2.3 mg/dL (ref 1.6–2.3)

## 2023-02-26 LAB — TSH: TSH: 1.53 u[IU]/mL (ref 0.450–4.500)

## 2023-03-10 DIAGNOSIS — H9113 Presbycusis, bilateral: Secondary | ICD-10-CM | POA: Diagnosis not present

## 2023-03-10 DIAGNOSIS — R42 Dizziness and giddiness: Secondary | ICD-10-CM | POA: Diagnosis not present

## 2023-03-10 DIAGNOSIS — H9202 Otalgia, left ear: Secondary | ICD-10-CM | POA: Diagnosis not present

## 2023-03-10 DIAGNOSIS — H9193 Unspecified hearing loss, bilateral: Secondary | ICD-10-CM | POA: Diagnosis not present

## 2023-03-10 DIAGNOSIS — M542 Cervicalgia: Secondary | ICD-10-CM | POA: Diagnosis not present

## 2023-03-11 DIAGNOSIS — I2510 Atherosclerotic heart disease of native coronary artery without angina pectoris: Secondary | ICD-10-CM | POA: Diagnosis not present

## 2023-03-11 DIAGNOSIS — E782 Mixed hyperlipidemia: Secondary | ICD-10-CM | POA: Diagnosis not present

## 2023-03-11 DIAGNOSIS — I679 Cerebrovascular disease, unspecified: Secondary | ICD-10-CM | POA: Diagnosis not present

## 2023-03-14 DIAGNOSIS — L739 Follicular disorder, unspecified: Secondary | ICD-10-CM | POA: Diagnosis not present

## 2023-03-14 DIAGNOSIS — Z411 Encounter for cosmetic surgery: Secondary | ICD-10-CM | POA: Diagnosis not present

## 2023-03-14 DIAGNOSIS — L570 Actinic keratosis: Secondary | ICD-10-CM | POA: Diagnosis not present

## 2023-03-23 DIAGNOSIS — H04123 Dry eye syndrome of bilateral lacrimal glands: Secondary | ICD-10-CM | POA: Diagnosis not present

## 2023-03-23 DIAGNOSIS — H16223 Keratoconjunctivitis sicca, not specified as Sjogren's, bilateral: Secondary | ICD-10-CM | POA: Diagnosis not present

## 2023-03-23 DIAGNOSIS — H16143 Punctate keratitis, bilateral: Secondary | ICD-10-CM | POA: Diagnosis not present

## 2023-03-30 ENCOUNTER — Ambulatory Visit (HOSPITAL_BASED_OUTPATIENT_CLINIC_OR_DEPARTMENT_OTHER): Payer: Medicare Other | Admitting: Cardiology

## 2023-04-19 DIAGNOSIS — H40013 Open angle with borderline findings, low risk, bilateral: Secondary | ICD-10-CM | POA: Diagnosis not present

## 2023-04-19 DIAGNOSIS — H04123 Dry eye syndrome of bilateral lacrimal glands: Secondary | ICD-10-CM | POA: Diagnosis not present

## 2023-04-22 DIAGNOSIS — R911 Solitary pulmonary nodule: Secondary | ICD-10-CM | POA: Diagnosis not present

## 2023-04-22 DIAGNOSIS — J22 Unspecified acute lower respiratory infection: Secondary | ICD-10-CM | POA: Diagnosis not present

## 2023-04-22 DIAGNOSIS — E782 Mixed hyperlipidemia: Secondary | ICD-10-CM | POA: Diagnosis not present

## 2023-04-22 DIAGNOSIS — I2510 Atherosclerotic heart disease of native coronary artery without angina pectoris: Secondary | ICD-10-CM | POA: Diagnosis not present

## 2023-04-22 DIAGNOSIS — I10 Essential (primary) hypertension: Secondary | ICD-10-CM | POA: Diagnosis not present

## 2023-04-22 DIAGNOSIS — I700 Atherosclerosis of aorta: Secondary | ICD-10-CM | POA: Diagnosis not present

## 2023-04-22 DIAGNOSIS — I679 Cerebrovascular disease, unspecified: Secondary | ICD-10-CM | POA: Diagnosis not present

## 2023-05-06 DIAGNOSIS — R509 Fever, unspecified: Secondary | ICD-10-CM | POA: Diagnosis not present

## 2023-05-06 DIAGNOSIS — Z03818 Encounter for observation for suspected exposure to other biological agents ruled out: Secondary | ICD-10-CM | POA: Diagnosis not present

## 2023-05-06 DIAGNOSIS — B349 Viral infection, unspecified: Secondary | ICD-10-CM | POA: Diagnosis not present

## 2023-05-25 DIAGNOSIS — H8111 Benign paroxysmal vertigo, right ear: Secondary | ICD-10-CM | POA: Diagnosis not present

## 2023-05-25 DIAGNOSIS — R42 Dizziness and giddiness: Secondary | ICD-10-CM | POA: Diagnosis not present

## 2023-06-03 DIAGNOSIS — I2510 Atherosclerotic heart disease of native coronary artery without angina pectoris: Secondary | ICD-10-CM | POA: Diagnosis not present

## 2023-06-03 DIAGNOSIS — R059 Cough, unspecified: Secondary | ICD-10-CM | POA: Diagnosis not present

## 2023-06-03 DIAGNOSIS — F418 Other specified anxiety disorders: Secondary | ICD-10-CM | POA: Diagnosis not present

## 2023-06-03 DIAGNOSIS — R911 Solitary pulmonary nodule: Secondary | ICD-10-CM | POA: Diagnosis not present

## 2023-06-03 DIAGNOSIS — E559 Vitamin D deficiency, unspecified: Secondary | ICD-10-CM | POA: Diagnosis not present

## 2023-06-03 DIAGNOSIS — E7439 Other disorders of intestinal carbohydrate absorption: Secondary | ICD-10-CM | POA: Diagnosis not present

## 2023-06-03 DIAGNOSIS — Z0000 Encounter for general adult medical examination without abnormal findings: Secondary | ICD-10-CM | POA: Diagnosis not present

## 2023-06-03 DIAGNOSIS — E782 Mixed hyperlipidemia: Secondary | ICD-10-CM | POA: Diagnosis not present

## 2023-06-03 DIAGNOSIS — K760 Fatty (change of) liver, not elsewhere classified: Secondary | ICD-10-CM | POA: Diagnosis not present

## 2023-06-03 DIAGNOSIS — K219 Gastro-esophageal reflux disease without esophagitis: Secondary | ICD-10-CM | POA: Diagnosis not present

## 2023-06-03 DIAGNOSIS — I10 Essential (primary) hypertension: Secondary | ICD-10-CM | POA: Diagnosis not present

## 2023-06-06 DIAGNOSIS — R0981 Nasal congestion: Secondary | ICD-10-CM | POA: Diagnosis not present

## 2023-06-06 DIAGNOSIS — I517 Cardiomegaly: Secondary | ICD-10-CM | POA: Diagnosis not present

## 2023-06-06 DIAGNOSIS — R059 Cough, unspecified: Secondary | ICD-10-CM | POA: Diagnosis not present

## 2023-08-31 DIAGNOSIS — L821 Other seborrheic keratosis: Secondary | ICD-10-CM | POA: Diagnosis not present

## 2023-08-31 DIAGNOSIS — D1801 Hemangioma of skin and subcutaneous tissue: Secondary | ICD-10-CM | POA: Diagnosis not present

## 2023-08-31 DIAGNOSIS — L986 Other infiltrative disorders of the skin and subcutaneous tissue: Secondary | ICD-10-CM | POA: Diagnosis not present

## 2023-08-31 DIAGNOSIS — D485 Neoplasm of uncertain behavior of skin: Secondary | ICD-10-CM | POA: Diagnosis not present

## 2023-08-31 DIAGNOSIS — C44329 Squamous cell carcinoma of skin of other parts of face: Secondary | ICD-10-CM | POA: Diagnosis not present

## 2023-08-31 DIAGNOSIS — L570 Actinic keratosis: Secondary | ICD-10-CM | POA: Diagnosis not present

## 2023-08-31 DIAGNOSIS — L814 Other melanin hyperpigmentation: Secondary | ICD-10-CM | POA: Diagnosis not present

## 2023-08-31 DIAGNOSIS — L578 Other skin changes due to chronic exposure to nonionizing radiation: Secondary | ICD-10-CM | POA: Diagnosis not present

## 2023-08-31 DIAGNOSIS — Z85828 Personal history of other malignant neoplasm of skin: Secondary | ICD-10-CM | POA: Diagnosis not present

## 2023-08-31 DIAGNOSIS — K130 Diseases of lips: Secondary | ICD-10-CM | POA: Diagnosis not present

## 2023-09-05 DIAGNOSIS — Z6829 Body mass index (BMI) 29.0-29.9, adult: Secondary | ICD-10-CM | POA: Diagnosis not present

## 2023-09-05 DIAGNOSIS — M545 Low back pain, unspecified: Secondary | ICD-10-CM | POA: Diagnosis not present

## 2023-09-05 DIAGNOSIS — N390 Urinary tract infection, site not specified: Secondary | ICD-10-CM | POA: Diagnosis not present

## 2023-09-05 DIAGNOSIS — Z1272 Encounter for screening for malignant neoplasm of vagina: Secondary | ICD-10-CM | POA: Diagnosis not present

## 2023-09-05 DIAGNOSIS — Z124 Encounter for screening for malignant neoplasm of cervix: Secondary | ICD-10-CM | POA: Diagnosis not present

## 2023-09-07 DIAGNOSIS — C44329 Squamous cell carcinoma of skin of other parts of face: Secondary | ICD-10-CM | POA: Diagnosis not present

## 2023-09-27 DIAGNOSIS — K130 Diseases of lips: Secondary | ICD-10-CM | POA: Diagnosis not present

## 2023-09-27 DIAGNOSIS — B009 Herpesviral infection, unspecified: Secondary | ICD-10-CM | POA: Diagnosis not present

## 2023-09-27 DIAGNOSIS — L570 Actinic keratosis: Secondary | ICD-10-CM | POA: Diagnosis not present

## 2023-10-07 DIAGNOSIS — K760 Fatty (change of) liver, not elsewhere classified: Secondary | ICD-10-CM | POA: Diagnosis not present

## 2023-10-07 DIAGNOSIS — F419 Anxiety disorder, unspecified: Secondary | ICD-10-CM | POA: Diagnosis not present

## 2023-10-07 DIAGNOSIS — I10 Essential (primary) hypertension: Secondary | ICD-10-CM | POA: Diagnosis not present

## 2023-10-07 DIAGNOSIS — F32A Depression, unspecified: Secondary | ICD-10-CM | POA: Diagnosis not present

## 2023-10-07 DIAGNOSIS — I2510 Atherosclerotic heart disease of native coronary artery without angina pectoris: Secondary | ICD-10-CM | POA: Diagnosis not present

## 2023-10-07 DIAGNOSIS — E782 Mixed hyperlipidemia: Secondary | ICD-10-CM | POA: Diagnosis not present

## 2023-10-07 DIAGNOSIS — Z6828 Body mass index (BMI) 28.0-28.9, adult: Secondary | ICD-10-CM | POA: Diagnosis not present

## 2023-10-07 DIAGNOSIS — E7439 Other disorders of intestinal carbohydrate absorption: Secondary | ICD-10-CM | POA: Diagnosis not present

## 2023-10-25 DIAGNOSIS — R0981 Nasal congestion: Secondary | ICD-10-CM | POA: Diagnosis not present

## 2023-10-25 DIAGNOSIS — R42 Dizziness and giddiness: Secondary | ICD-10-CM | POA: Diagnosis not present

## 2023-10-25 DIAGNOSIS — Z6828 Body mass index (BMI) 28.0-28.9, adult: Secondary | ICD-10-CM | POA: Diagnosis not present

## 2023-11-08 DIAGNOSIS — Z6829 Body mass index (BMI) 29.0-29.9, adult: Secondary | ICD-10-CM | POA: Diagnosis not present

## 2023-11-08 DIAGNOSIS — I10 Essential (primary) hypertension: Secondary | ICD-10-CM | POA: Diagnosis not present

## 2023-12-03 DIAGNOSIS — Z1231 Encounter for screening mammogram for malignant neoplasm of breast: Secondary | ICD-10-CM | POA: Diagnosis not present

## 2023-12-13 DIAGNOSIS — I10 Essential (primary) hypertension: Secondary | ICD-10-CM | POA: Diagnosis not present

## 2023-12-13 DIAGNOSIS — Z7185 Encounter for immunization safety counseling: Secondary | ICD-10-CM | POA: Diagnosis not present

## 2023-12-13 DIAGNOSIS — I2510 Atherosclerotic heart disease of native coronary artery without angina pectoris: Secondary | ICD-10-CM | POA: Diagnosis not present

## 2023-12-13 DIAGNOSIS — M8589 Other specified disorders of bone density and structure, multiple sites: Secondary | ICD-10-CM | POA: Diagnosis not present

## 2023-12-13 DIAGNOSIS — Z6828 Body mass index (BMI) 28.0-28.9, adult: Secondary | ICD-10-CM | POA: Diagnosis not present

## 2024-02-14 DIAGNOSIS — R300 Dysuria: Secondary | ICD-10-CM | POA: Diagnosis not present

## 2024-03-02 DIAGNOSIS — N390 Urinary tract infection, site not specified: Secondary | ICD-10-CM | POA: Diagnosis not present

## 2024-03-13 DIAGNOSIS — D126 Benign neoplasm of colon, unspecified: Secondary | ICD-10-CM | POA: Diagnosis not present

## 2024-03-13 DIAGNOSIS — M8580 Other specified disorders of bone density and structure, unspecified site: Secondary | ICD-10-CM | POA: Diagnosis not present

## 2024-03-13 DIAGNOSIS — F411 Generalized anxiety disorder: Secondary | ICD-10-CM | POA: Diagnosis not present

## 2024-03-13 DIAGNOSIS — Z6828 Body mass index (BMI) 28.0-28.9, adult: Secondary | ICD-10-CM | POA: Diagnosis not present

## 2024-03-13 DIAGNOSIS — I10 Essential (primary) hypertension: Secondary | ICD-10-CM | POA: Diagnosis not present

## 2024-03-13 DIAGNOSIS — I2510 Atherosclerotic heart disease of native coronary artery without angina pectoris: Secondary | ICD-10-CM | POA: Diagnosis not present

## 2024-03-13 DIAGNOSIS — E782 Mixed hyperlipidemia: Secondary | ICD-10-CM | POA: Diagnosis not present

## 2024-03-13 DIAGNOSIS — R3911 Hesitancy of micturition: Secondary | ICD-10-CM | POA: Diagnosis not present

## 2024-03-13 DIAGNOSIS — Z7189 Other specified counseling: Secondary | ICD-10-CM | POA: Diagnosis not present

## 2024-03-23 ENCOUNTER — Encounter (HOSPITAL_BASED_OUTPATIENT_CLINIC_OR_DEPARTMENT_OTHER): Payer: Self-pay | Admitting: Family

## 2024-03-23 ENCOUNTER — Ambulatory Visit (INDEPENDENT_AMBULATORY_CARE_PROVIDER_SITE_OTHER): Admitting: Family

## 2024-03-23 VITALS — BP 132/60 | HR 78 | Resp 17 | Ht 60.0 in | Wt 144.0 lb

## 2024-03-23 DIAGNOSIS — I493 Ventricular premature depolarization: Secondary | ICD-10-CM | POA: Diagnosis not present

## 2024-03-23 DIAGNOSIS — I2510 Atherosclerotic heart disease of native coronary artery without angina pectoris: Secondary | ICD-10-CM

## 2024-03-23 DIAGNOSIS — I10 Essential (primary) hypertension: Secondary | ICD-10-CM

## 2024-03-23 DIAGNOSIS — R002 Palpitations: Secondary | ICD-10-CM

## 2024-03-23 DIAGNOSIS — E782 Mixed hyperlipidemia: Secondary | ICD-10-CM | POA: Diagnosis not present

## 2024-03-23 NOTE — Patient Instructions (Addendum)
 Medication Instructions:  Continue your current medications.   *If you need a refill on your cardiac medications before your next appointment, please call your pharmacy*  Testing/Procedures: Your EKG today looked great  Follow-Up: At [facility], you and your health needs are our priority.  As part of our continuing mission to provide you with exceptional heart care, our providers are all part of one team.  This team includes your primary Cardiologist (physician) and Advanced Practice Providers or APPs (Physician Assistants and Nurse Practitioners) who all work together to provide you with the care you need, when you need it.  Your next appointment:   1 year(s)  Provider:   Shelda Bruckner, MD or Reche Finder, NP    We recommend signing up for the patient portal called MyChart.  Sign up information is provided on this After Visit Summary.  MyChart is used to connect with patients for Virtual Visits (Telemedicine).  Patients are able to view lab/test results, encounter notes, upcoming appointments, etc.  Non-urgent messages can be sent to your provider as well.   To learn more about what you can do with MyChart, go to [email or web address].   Other Instructions  Heart Healthy Diet Recommendations: A low-salt diet is recommended. Meats should be grilled, baked, or boiled. Avoid fried foods. Focus on lean protein sources like fish or chicken with vegetables and fruits. The American Heart Association is a Chief Technology Officer!  American Heart Association Diet and Lifeystyle Recommendations   Exercise recommendations: The American Heart Association recommends 150 minutes of moderate intensity exercise weekly. Try 30 minutes of moderate intensity exercise 4-5 times per week. This could include walking, jogging, or swimming.  To prevent palpitations: Make sure you are adequately hydrated.  Avoid and/or limit caffeine containing beverages like soda or tea. Exercise regularly.   Manage stress well. Some over the counter medications can cause palpitations such as Benadryl, AdvilPM, TylenolPM. Regular Advil or Tylenol do not cause palpitations.

## 2024-03-23 NOTE — Progress Notes (Signed)
 Cardiology Office Note   Date:  03/23/2024  ID:  Katelyn Taylor, Schwier 06-18-1940, MRN [id number] PCP: Frederik Charleston, MD  [facility] Providers Cardiologist:  Shelda Bruckner, MD     History of Present Illness Katelyn Taylor is a 83 y.o. female with hx of CAD, HTN, orthostatic hypotension, HLD, PE (1970s), anxiety. Family history of coronary artery disease.   12/2019 CCTA calcium  score 8, minimal nonobstructive CAD. Prior TTE LVEF 60-65%, moderate concentric LVH, gr1dd. Repeat CCTA 02/2022 calcium  score 33 with mild stenosis in distal LM and ostial LAD.   She was last seen 02/25/23 with intermittent palpitations, she wished to defer monitor as symptoms were intermittent. Rosuvastatin  5mg  daily initiated. Repeat labs with primary care 10/07/23 LDL 76.  Presents today for follow up independently. Pleasant lady - her sona nd grandosn live with her. Enjoys doing puzzles in her spare time. Notes occasional palpitations with stress which she attributes to her anxiety. PCP has adjusted Lisinopril to help with BP control. Recently had round of abx for UTI. Has had some abdominal pain with urination and planning to see urology. Reports being less active after the hot weather and due to fall allergies. She reports she is no longer taking Rosuvastatin  due to leg cramps. PCP had adjusted to every other day without improvement in symptoms. Her most recent labs were while not on statin - PCP recommended not resuming.   ROS: Please see the history of present illness.    All other systems reviewed and are negative.   Studies Reviewed EKG Interpretation Date/Time:  Friday March 23 2024 09:59:13 EDT Ventricular Rate:  78 PR Interval:  184 QRS Duration:  86 QT Interval:  358 QTC Calculation: 408 R Axis:   -18  Text Interpretation: Normal sinus rhythm Normal ECG Confirmed by Vannie Mora ([id number]) on 03/23/2024 10:08:00 AM    Cardiac Studies & Procedures    ______________________________________________________________________________________________     ECHOCARDIOGRAM  ECHOCARDIOGRAM COMPLETE 12/28/2019  Narrative ECHOCARDIOGRAM REPORT    Patient Name:   Katelyn Taylor  Date of Exam: 12/28/2019 Medical Rec #:  [id number]     Height:       60.0 in Accession #:    [id number]    Weight:       175.4 lb Date of Birth:  Jun 17, 1940    BSA:          1.765 m Patient Age:    78 years      BP:           124/68 mmHg Patient Gender: F             HR:           63 bpm. Exam Location:  [address]  Procedure: 2D Echo, Cardiac Doppler and Color Doppler  Indications:    I10 Hypertension; R07.9 Precordial pain; Z82.49 Family history of CAD; Dizziness  History:        Patient has no prior history of Echocardiogram examinations. Risk Factors:Dyslipidemia and Hypertension. Back pain.  Sonographer:    Jon Hacker RCS Referring Phys: [id number] LEIM DEL NELSON  IMPRESSIONS   1. Left ventricular ejection fraction, by estimation, is 60 to 65%. The left ventricle has normal function. The left ventricle has no regional wall motion abnormalities. There is moderate concentric left ventricular hypertrophy. Left ventricular diastolic parameters are consistent with Grade I diastolic dysfunction (impaired relaxation). 2. Right ventricular systolic function is normal. The right ventricular size is normal. Tricuspid regurgitation signal is inadequate  for assessing PA pressure. 3. The mitral valve is normal in structure. Trivial mitral valve regurgitation. No evidence of mitral stenosis. 4. The aortic valve is tricuspid. Aortic valve regurgitation is mild. No aortic stenosis is present. 5. The inferior vena cava is dilated in size with <50% respiratory variability, suggesting right atrial pressure of 15 mmHg.  FINDINGS Left Ventricle: Left ventricular ejection fraction, by estimation, is 60 to 65%. The left ventricle has normal function. The left ventricle  has no regional wall motion abnormalities. The left ventricular internal cavity size was normal in size. There is moderate concentric left ventricular hypertrophy. Left ventricular diastolic parameters are consistent with Grade I diastolic dysfunction (impaired relaxation). Normal left ventricular filling pressure.  Right Ventricle: The right ventricular size is normal. No increase in right ventricular wall thickness. Right ventricular systolic function is normal. Tricuspid regurgitation signal is inadequate for assessing PA pressure.  Left Atrium: Left atrial size was normal in size.  Right Atrium: Right atrial size was normal in size.  Pericardium: There is no evidence of pericardial effusion.  Mitral Valve: The mitral valve is normal in structure. Normal mobility of the mitral valve leaflets. Trivial mitral valve regurgitation. No evidence of mitral valve stenosis.  Tricuspid Valve: The tricuspid valve is normal in structure. Tricuspid valve regurgitation is not demonstrated. No evidence of tricuspid stenosis.  Aortic Valve: The aortic valve is tricuspid. Aortic valve regurgitation is mild. No aortic stenosis is present.  Pulmonic Valve: The pulmonic valve was normal in structure. Pulmonic valve regurgitation is trivial. No evidence of pulmonic stenosis.  Aorta: The aortic root is normal in size and structure.  Venous: The inferior vena cava is dilated in size with less than 50% respiratory variability, suggesting right atrial pressure of 15 mmHg.  IAS/Shunts: No atrial level shunt detected by color flow Doppler.   LEFT VENTRICLE PLAX 2D LVIDd:         3.50 cm  Diastology LVIDs:         2.40 cm  LV e' lateral:   6.20 cm/s LV PW:         1.40 cm  LV E/e' lateral: 6.7 LV IVS:        1.50 cm  LV e' medial:    4.35 cm/s LVOT diam:     2.30 cm  LV E/e' medial:  9.6 LV SV:         74 LV SV Index:   42 LVOT Area:     4.15 cm   RIGHT VENTRICLE RV Basal diam:  2.20 cm RV S prime:      10.70 cm/s TAPSE (M-mode): 1.8 cm  LEFT ATRIUM             Index       RIGHT ATRIUM          Index LA diam:        4.00 cm 2.27 cm/m  RA Area:     8.93 cm LA Vol (A2C):   21.4 ml 12.12 ml/m RA Volume:   16.70 ml 9.46 ml/m LA Vol (A4C):   22.6 ml 12.80 ml/m LA Biplane Vol: 23.6 ml 13.37 ml/m AORTIC VALVE LVOT Vmax:   76.50 cm/s LVOT Vmean:  48.700 cm/s LVOT VTI:    0.179 m  AORTA Ao Root diam: 3.10 cm  MITRAL VALVE MV Area (PHT): 4.15 cm    SHUNTS MV Decel Time: 183 msec    Systemic VTI:  0.18 m MV E velocity: 41.60 cm/s  Systemic Diam:  2.30 cm MV A velocity: 54.50 cm/s MV E/A ratio:  0.76  Annabella Scarce MD Electronically signed by Annabella Scarce MD Signature Date/Time: 12/28/2019/4:54:59 PM    Final      CT SCANS  CT CORONARY MORPH W/CTA COR W/SCORE 02/18/2022  Addendum 02/18/2022  3:53 PM ADDENDUM REPORT: 02/18/2022 15:50  CLINICAL DATA:  Chest pain  EXAM: Cardiac CTA  MEDICATIONS: Sub lingual nitro. 4mg  x 2  TECHNIQUE: The patient was scanned on a Siemens 192 slice scanner. Gantry rotation speed was 250 msecs. Collimation was 0.6 mm. A 100 kV prospective scan was triggered in the ascending thoracic aorta at 35-75% of the R-R interval. Average HR during the scan was 60 bpm. The 3D data set was interpreted on a dedicated work station using MPR, MIP and VRT modes. A total of 80cc of contrast was used.  FINDINGS: Non-cardiac: See separate report from [facility].  No LA appendage thrombus. Pulmonary veins drain normally to the left atrium.  Calcium  Score: 33 Agatston units.  Coronary Arteries: Right dominant with no anomalies  LM: Mixed plaque distal left main, mild (1-24%) stenosis.  LAD system: Mixed plaque ostial LAD, mild (25-49%) stenosis.  Circumflex system: No plaque or stenosis.  RCA system: Mixed plaque mid RCA, minimal stenosis.  IMPRESSION: 1. Coronary calcium  score 33 Agatston units. The places the patient in the  33rd percentile for age and gender, suggesting low risk for future cardiac events.  2.  Mild stenosis in the distal left main and ostial LAD.  Dalton Mclean   Electronically Signed By: Ezra Shuck M.D. On: 02/18/2022 15:50  Narrative EXAM: OVER-READ INTERPRETATION  CT CHEST  The following report is a limited chest CT over-read performed by radiologist Dr. MYRTIS Stammer of [facility] on 02/18/2022. The coronary CTA interpretation by the cardiologist is attached.  COMPARISON:  Chest CT 09/02/2021  FINDINGS: Vascular: Normal caliber ascending thoracic aorta.  Mediastinum/Nodes: No mediastinal or hilar mass or lymphadenopathy. The esophagus is grossly normal.  Lungs/Pleura: Stable basilar scarring changes. No worrisome pulmonary lesions or pulmonary nodules.  Upper Abdomen: No significant upper abdominal findings.  Musculoskeletal: No significant bony findings.  IMPRESSION: No significant extracardiac findings.  Electronically Signed: By: MYRTIS Stammer M.D. On: 02/18/2022 14:25   CT SCANS  CT CORONARY MORPH W/CTA COR W/SCORE 12/31/2019  Addendum 01/01/2020  2:52 PM ADDENDUM REPORT: 01/01/2020 14:49  CLINICAL DATA:  83 year old female with h/o hypertension, FH of early CAD and chest pain.  EXAM: Cardiac/Coronary  CTA  TECHNIQUE: The patient was scanned on a [employer].  FINDINGS: A 100 kV prospective scan was triggered in the descending thoracic aorta at 111 HU's. Axial non-contrast 3 mm slices were carried out through the heart. The data set was analyzed on a dedicated work station and scored using the Agatson method. Gantry rotation speed was 250 msecs and collimation was .6 mm. 50 mg PO Metoprolol  and 0.8 mg of sl NTG was given. The 3D data set was reconstructed in 5% intervals of the 67-82 % of the R-R cycle. Diastolic phases were analyzed on a dedicated work station using MPR, MIP and VRT modes. The patient received 80 cc of  contrast.  Aorta: Normal size. Minimal diffuse atherosclerotic plaque and calcifications. No dissection.  Aortic Valve:  Trileaflet.  No calcifications.  Coronary Arteries:  Normal coronary origin.  Right dominance.  RCA is a large dominant artery that gives rise to PDA and PLA. There is minimal calcified plaque in the proximal portion  with stenosis 0-25%.  Left main is a large artery that gives rise to LAD and LCX arteries. Distal left main has minimal eccentric calcified plaque with stenosis 0-25%.  LAD is a large vessel that gives rise to one large diagonal artery and has only minimal irregularities.  LCX is a non-dominant artery that gives rise to one large OM1 branch. There are only minimal irregularities.  Other findings:  Normal pulmonary vein drainage into the left atrium.  Normal left atrial appendage without a thrombus.  Normal size of the pulmonary artery.  IMPRESSION: 1. Coronary calcium  score of 8. This was 26 percentile for age and sex matched control.  2. Normal coronary origin with right dominance.  3. CAD-RADS 1. Minimal non-obstructive CAD (0-24%) in the proximal RCA and distal left main. Consider non-atherosclerotic causes of chest pain. Consider preventive therapy and risk factor modification.   Electronically Signed By: Leim Moose On: 01/01/2020 14:49  Narrative EXAM: OVER-READ INTERPRETATION  CT CHEST  The following report is an over-read performed by radiologist Dr. Toribio Aye of [facility] on 12/31/2019. This over-read does not include interpretation of cardiac or coronary anatomy or pathology. The coronary calcium  score/coronary CTA interpretation by the cardiologist is attached.  COMPARISON:  None.  FINDINGS: Aortic atherosclerosis. Probable nodular area of scarring in the posterior aspect of the left lower lobe (axial image 33 of series 12). Within the visualized portions of the thorax there are no  other more suspicious appearing pulmonary nodules or masses, there is no acute consolidative airspace disease, no pleural effusions, no pneumothorax and no lymphadenopathy. Visualized portions of the upper abdomen demonstrates diffuse low attenuation throughout the visualized hepatic parenchyma, indicative of severe hepatic steatosis. There are no aggressive appearing lytic or blastic lesions noted in the visualized portions of the skeleton.  IMPRESSION: 1. Nodular area of architectural distortion in the posterior aspect of the left lower lobe favored to represent an area of post infectious or inflammatory scarring. However, repeat noncontrast chest CT is recommended in 3 months to ensure the stability or resolution of this finding. This recommendation follows the consensus statement: Guidelines for Management of Incidental Pulmonary Nodules Detected on CT Images: From the Fleischner Society 2017; Radiology 2017; 284:228-243. 2.  Aortic Atherosclerosis (ICD10-I70.0). 3. Hepatic steatosis.  Electronically Signed: By: Toribio Aye M.D. On: 12/31/2019 13:43     ______________________________________________________________________________________________      Risk Assessment/Calculations           Physical Exam VS:  BP 138/68 (BP Location: Left Arm, Patient Position: Sitting, Cuff Size: Normal)   Pulse 78   Resp 17   Ht 5' (1.524 m)   Wt 144 lb (65.3 kg)   SpO2 98%   BMI 28.12 kg/m   Vitals:   03/23/24 1000 03/23/24 1022  BP: 138/68 132/60  Pulse: 78   Resp: 17   Height: 5' (1.524 m)   Weight: 144 lb (65.3 kg)   SpO2: 98%   BMI (Calculated): 28.12          Wt Readings from Last 3 Encounters:  03/23/24 144 lb (65.3 kg)  02/25/23 145 lb 12.8 oz (66.1 kg)  03/12/22 148 lb 3.2 oz (67.2 kg)    GEN: Well nourished, well developed in no acute distress NECK: No JVD; No carotid bruits CARDIAC: RRR, no murmurs, rubs, gallops RESPIRATORY:  Clear to auscultation  without rales, wheezing or rhonchi  ABDOMEN: Soft, non-tender, non-distended EXTREMITIES:  No edema; No deformity   ASSESSMENT AND PLAN  Nonobstructive CAD / HLD, LDL goal <70 - Stable with no anginal symptoms. No indication for ischemic evaluation.  GDMT aspirin 81mg  daily. Did not tolerate Rosuvastatin  5mg  with leg cramps. 09/2023 LDL 76. Prefers to continue with lifestyle management of choletserol.  Referred to PREP exercise program to consider participating  HTN - BP reasonably controlled. PCP has increased Lisinopril from 5mg  to 10mg  to help with BP control. Continue Furosemide (Lasix) 40mg  daily.  Continue to follow with PCP.        Dispo: follow up in 1 year with Dr. Lonni  Signed, Reche GORMAN Finder, NP

## 2024-04-10 DIAGNOSIS — R945 Abnormal results of liver function studies: Secondary | ICD-10-CM | POA: Diagnosis not present

## 2024-04-24 DIAGNOSIS — H04123 Dry eye syndrome of bilateral lacrimal glands: Secondary | ICD-10-CM | POA: Diagnosis not present

## 2024-04-24 DIAGNOSIS — H40013 Open angle with borderline findings, low risk, bilateral: Secondary | ICD-10-CM | POA: Diagnosis not present

## 2024-04-24 DIAGNOSIS — H16143 Punctate keratitis, bilateral: Secondary | ICD-10-CM | POA: Diagnosis not present

## 2024-04-24 DIAGNOSIS — H0100A Unspecified blepharitis right eye, upper and lower eyelids: Secondary | ICD-10-CM | POA: Diagnosis not present

## 2024-05-01 DIAGNOSIS — N390 Urinary tract infection, site not specified: Secondary | ICD-10-CM | POA: Diagnosis not present

## 2024-05-01 DIAGNOSIS — R350 Frequency of micturition: Secondary | ICD-10-CM | POA: Diagnosis not present

## 2024-05-01 DIAGNOSIS — R351 Nocturia: Secondary | ICD-10-CM | POA: Diagnosis not present
